# Patient Record
Sex: Male | Born: 1935 | Hispanic: Yes | Marital: Married | Smoking: Former smoker
Health system: Southern US, Community
[De-identification: ages and names within clinical notes are randomized; demographics above are authoritative.]

## PROBLEM LIST (undated history)

## (undated) DIAGNOSIS — M8580 Other specified disorders of bone density and structure, unspecified site: Secondary | ICD-10-CM

## (undated) DIAGNOSIS — I10 Essential (primary) hypertension: Secondary | ICD-10-CM

## (undated) DIAGNOSIS — Z9861 Coronary angioplasty status: Secondary | ICD-10-CM

## (undated) DIAGNOSIS — R011 Cardiac murmur, unspecified: Secondary | ICD-10-CM

## (undated) DIAGNOSIS — R0602 Shortness of breath: Secondary | ICD-10-CM

## (undated) DIAGNOSIS — K279 Peptic ulcer, site unspecified, unspecified as acute or chronic, without hemorrhage or perforation: Secondary | ICD-10-CM

## (undated) DIAGNOSIS — I209 Angina pectoris, unspecified: Secondary | ICD-10-CM

## (undated) DIAGNOSIS — I2510 Atherosclerotic heart disease of native coronary artery without angina pectoris: Secondary | ICD-10-CM

## (undated) DIAGNOSIS — E785 Hyperlipidemia, unspecified: Secondary | ICD-10-CM

## (undated) DIAGNOSIS — R413 Other amnesia: Secondary | ICD-10-CM

## (undated) DIAGNOSIS — K219 Gastro-esophageal reflux disease without esophagitis: Secondary | ICD-10-CM

## (undated) DIAGNOSIS — M1990 Unspecified osteoarthritis, unspecified site: Secondary | ICD-10-CM

## (undated) DIAGNOSIS — K5790 Diverticulosis of intestine, part unspecified, without perforation or abscess without bleeding: Secondary | ICD-10-CM

## (undated) HISTORY — PX: TONSILLECTOMY AND ADENOIDECTOMY: SUR1326

## (undated) HISTORY — DX: Gilbert syndrome: E80.4

## (undated) HISTORY — DX: Essential (primary) hypertension: I10

## (undated) HISTORY — DX: Other specified disorders of bone density and structure, unspecified site: M85.80

## (undated) HISTORY — DX: Hyperlipidemia, unspecified: E78.5

## (undated) HISTORY — PX: HEMORRHOID SURGERY: SHX153

## (undated) HISTORY — DX: Diverticulosis of intestine, part unspecified, without perforation or abscess without bleeding: K57.90

## (undated) HISTORY — DX: Peptic ulcer, site unspecified, unspecified as acute or chronic, without hemorrhage or perforation: K27.9

## (undated) HISTORY — PX: UPPER GASTROINTESTINAL ENDOSCOPY: SHX188

---

## 1995-06-17 HISTORY — PX: CERVICAL FUSION: SHX112

## 1999-10-21 ENCOUNTER — Emergency Department (HOSPITAL_COMMUNITY): Admission: EM | Admit: 1999-10-21 | Discharge: 1999-10-21 | Payer: Self-pay | Admitting: Emergency Medicine

## 2000-05-20 ENCOUNTER — Encounter: Payer: Self-pay | Admitting: Internal Medicine

## 2000-05-20 ENCOUNTER — Encounter: Admission: RE | Admit: 2000-05-20 | Discharge: 2000-05-20 | Payer: Self-pay | Admitting: Internal Medicine

## 2001-06-16 DIAGNOSIS — I2510 Atherosclerotic heart disease of native coronary artery without angina pectoris: Secondary | ICD-10-CM

## 2001-06-16 HISTORY — PX: CORONARY ARTERY BYPASS GRAFT: SHX141

## 2001-06-16 HISTORY — DX: Atherosclerotic heart disease of native coronary artery without angina pectoris: I25.10

## 2001-07-22 ENCOUNTER — Inpatient Hospital Stay (HOSPITAL_COMMUNITY): Admission: AD | Admit: 2001-07-22 | Discharge: 2001-07-27 | Payer: Self-pay | Admitting: *Deleted

## 2001-07-22 ENCOUNTER — Encounter: Payer: Self-pay | Admitting: Thoracic Surgery (Cardiothoracic Vascular Surgery)

## 2001-07-23 ENCOUNTER — Encounter: Payer: Self-pay | Admitting: Thoracic Surgery (Cardiothoracic Vascular Surgery)

## 2001-07-24 ENCOUNTER — Encounter: Payer: Self-pay | Admitting: Thoracic Surgery (Cardiothoracic Vascular Surgery)

## 2001-07-25 ENCOUNTER — Encounter: Payer: Self-pay | Admitting: Thoracic Surgery (Cardiothoracic Vascular Surgery)

## 2001-08-17 ENCOUNTER — Encounter (HOSPITAL_COMMUNITY): Admission: RE | Admit: 2001-08-17 | Discharge: 2001-11-15 | Payer: Self-pay | Admitting: Internal Medicine

## 2001-11-11 ENCOUNTER — Encounter: Payer: Self-pay | Admitting: Internal Medicine

## 2001-11-11 ENCOUNTER — Encounter: Admission: RE | Admit: 2001-11-11 | Discharge: 2001-11-11 | Payer: Self-pay | Admitting: Internal Medicine

## 2001-11-16 ENCOUNTER — Encounter (HOSPITAL_COMMUNITY): Admission: RE | Admit: 2001-11-16 | Discharge: 2002-02-14 | Payer: Self-pay | Admitting: Cardiology

## 2002-06-16 HISTORY — PX: CHOLECYSTECTOMY: SHX55

## 2003-02-10 ENCOUNTER — Encounter: Admission: RE | Admit: 2003-02-10 | Discharge: 2003-02-10 | Payer: Self-pay | Admitting: Internal Medicine

## 2003-02-10 ENCOUNTER — Encounter: Payer: Self-pay | Admitting: Internal Medicine

## 2003-02-13 ENCOUNTER — Encounter: Payer: Self-pay | Admitting: Internal Medicine

## 2003-02-13 ENCOUNTER — Inpatient Hospital Stay (HOSPITAL_COMMUNITY): Admission: EM | Admit: 2003-02-13 | Discharge: 2003-02-15 | Payer: Self-pay | Admitting: Internal Medicine

## 2003-02-14 ENCOUNTER — Encounter: Payer: Self-pay | Admitting: Internal Medicine

## 2003-02-14 ENCOUNTER — Encounter (INDEPENDENT_AMBULATORY_CARE_PROVIDER_SITE_OTHER): Payer: Self-pay | Admitting: Specialist

## 2004-08-29 ENCOUNTER — Ambulatory Visit: Payer: Self-pay | Admitting: Internal Medicine

## 2005-03-18 ENCOUNTER — Ambulatory Visit: Payer: Self-pay | Admitting: Internal Medicine

## 2005-05-13 ENCOUNTER — Ambulatory Visit: Payer: Self-pay | Admitting: Internal Medicine

## 2005-05-21 ENCOUNTER — Ambulatory Visit: Payer: Self-pay | Admitting: Internal Medicine

## 2005-06-05 ENCOUNTER — Ambulatory Visit: Payer: Self-pay | Admitting: Internal Medicine

## 2005-06-11 ENCOUNTER — Ambulatory Visit: Payer: Self-pay | Admitting: Internal Medicine

## 2005-08-19 ENCOUNTER — Ambulatory Visit: Payer: Self-pay | Admitting: Internal Medicine

## 2005-09-02 ENCOUNTER — Ambulatory Visit: Payer: Self-pay | Admitting: Internal Medicine

## 2005-11-07 ENCOUNTER — Ambulatory Visit: Payer: Self-pay | Admitting: Internal Medicine

## 2005-11-18 ENCOUNTER — Ambulatory Visit: Payer: Self-pay | Admitting: Internal Medicine

## 2006-03-26 ENCOUNTER — Ambulatory Visit: Payer: Self-pay | Admitting: Internal Medicine

## 2006-04-02 ENCOUNTER — Ambulatory Visit: Payer: Self-pay | Admitting: Internal Medicine

## 2006-04-23 ENCOUNTER — Ambulatory Visit: Payer: Self-pay | Admitting: Gastroenterology

## 2006-05-04 ENCOUNTER — Ambulatory Visit: Payer: Self-pay | Admitting: Gastroenterology

## 2006-06-04 ENCOUNTER — Ambulatory Visit: Payer: Self-pay | Admitting: Internal Medicine

## 2006-06-10 ENCOUNTER — Encounter: Admission: RE | Admit: 2006-06-10 | Discharge: 2006-06-10 | Payer: Self-pay | Admitting: Internal Medicine

## 2006-06-16 HISTORY — PX: CORONARY STENT PLACEMENT: SHX1402

## 2006-09-10 DIAGNOSIS — I2510 Atherosclerotic heart disease of native coronary artery without angina pectoris: Secondary | ICD-10-CM | POA: Insufficient documentation

## 2006-10-26 ENCOUNTER — Ambulatory Visit: Payer: Self-pay | Admitting: Internal Medicine

## 2006-10-26 LAB — CONVERTED CEMR LAB
ALT: 23 units/L (ref 0–40)
AST: 20 units/L (ref 0–37)
Basophils Absolute: 0.1 10*3/uL (ref 0.0–0.1)
Basophils Relative: 0.8 % (ref 0.0–1.0)
Bilirubin, Direct: 0.1 mg/dL (ref 0.0–0.3)
CO2: 33 meq/L — ABNORMAL HIGH (ref 19–32)
Calcium: 9.4 mg/dL (ref 8.4–10.5)
Cholesterol: 155 mg/dL (ref 0–200)
Eosinophils Relative: 9.6 % — ABNORMAL HIGH (ref 0.0–5.0)
GFR calc Af Amer: 95 mL/min
Glucose, Bld: 99 mg/dL (ref 70–99)
HCT: 46.5 % (ref 39.0–52.0)
HDL: 42 mg/dL (ref >39.0)
Hemoglobin: 16.4 g/dL (ref 13.0–17.0)
Lymphocytes Relative: 23.5 % (ref 12.0–46.0)
MCHC: 35.3 g/dL (ref 30.0–36.0)
MCV: 88.6 fL (ref 78.0–100.0)
Monocytes Absolute: 0.6 10*3/uL (ref 0.2–0.7)
Neutrophils Relative %: 57.2 % (ref 43.0–77.0)
TSH: 1.44 microintl units/mL (ref 0.35–5.50)
Triglycerides: 149 mg/dL (ref 0–149)
VLDL: 30 mg/dL (ref 0–40)
WBC: 7.2 10*3/uL (ref 4.5–10.5)

## 2006-10-30 ENCOUNTER — Encounter: Payer: Self-pay | Admitting: Internal Medicine

## 2006-10-30 ENCOUNTER — Ambulatory Visit: Payer: Self-pay | Admitting: Internal Medicine

## 2006-12-31 ENCOUNTER — Ambulatory Visit: Payer: Self-pay | Admitting: Internal Medicine

## 2007-02-06 ENCOUNTER — Ambulatory Visit: Payer: Self-pay | Admitting: Cardiology

## 2007-02-06 ENCOUNTER — Inpatient Hospital Stay (HOSPITAL_COMMUNITY): Admission: EM | Admit: 2007-02-06 | Discharge: 2007-02-09 | Payer: Self-pay | Admitting: Emergency Medicine

## 2007-02-08 ENCOUNTER — Other Ambulatory Visit: Payer: Self-pay | Admitting: Cardiology

## 2007-02-09 ENCOUNTER — Other Ambulatory Visit: Payer: Self-pay | Admitting: Cardiology

## 2007-02-11 ENCOUNTER — Ambulatory Visit: Payer: Self-pay | Admitting: Internal Medicine

## 2007-02-11 DIAGNOSIS — R7989 Other specified abnormal findings of blood chemistry: Secondary | ICD-10-CM | POA: Insufficient documentation

## 2007-02-13 LAB — CONVERTED CEMR LAB: Hgb A1c MFr Bld: 5.5 % (ref 4.6–6.0)

## 2007-02-16 ENCOUNTER — Encounter (INDEPENDENT_AMBULATORY_CARE_PROVIDER_SITE_OTHER): Payer: Self-pay | Admitting: *Deleted

## 2007-02-25 ENCOUNTER — Ambulatory Visit: Payer: Self-pay | Admitting: Cardiology

## 2007-04-27 ENCOUNTER — Ambulatory Visit: Payer: Self-pay | Admitting: Internal Medicine

## 2007-04-27 LAB — CONVERTED CEMR LAB
Cholesterol: 134 mg/dL (ref 0–200)
LDL Cholesterol: 66 mg/dL (ref 0–99)
Triglycerides: 143 mg/dL (ref 0–149)
VLDL: 29 mg/dL (ref 0–40)

## 2007-05-04 ENCOUNTER — Ambulatory Visit: Payer: Self-pay | Admitting: Internal Medicine

## 2007-05-04 DIAGNOSIS — I10 Essential (primary) hypertension: Secondary | ICD-10-CM | POA: Insufficient documentation

## 2007-05-04 DIAGNOSIS — E782 Mixed hyperlipidemia: Secondary | ICD-10-CM | POA: Insufficient documentation

## 2007-05-04 LAB — CONVERTED CEMR LAB
HDL goal, serum: 40 mg/dL
LDL Goal: 100 mg/dL

## 2007-06-02 ENCOUNTER — Ambulatory Visit: Payer: Self-pay | Admitting: Internal Medicine

## 2007-06-03 ENCOUNTER — Ambulatory Visit: Payer: Self-pay | Admitting: Cardiology

## 2007-06-08 ENCOUNTER — Encounter (INDEPENDENT_AMBULATORY_CARE_PROVIDER_SITE_OTHER): Payer: Self-pay | Admitting: *Deleted

## 2007-11-15 ENCOUNTER — Telehealth (INDEPENDENT_AMBULATORY_CARE_PROVIDER_SITE_OTHER): Payer: Self-pay | Admitting: *Deleted

## 2008-02-15 ENCOUNTER — Ambulatory Visit: Payer: Self-pay | Admitting: Cardiology

## 2008-02-18 ENCOUNTER — Ambulatory Visit: Payer: Self-pay

## 2008-02-18 ENCOUNTER — Ambulatory Visit: Payer: Self-pay | Admitting: Cardiology

## 2008-02-18 ENCOUNTER — Encounter: Payer: Self-pay | Admitting: Internal Medicine

## 2008-02-18 LAB — CONVERTED CEMR LAB
Albumin: 4.4 g/dL (ref 3.5–5.2)
Alkaline Phosphatase: 59 units/L (ref 39–117)
Cholesterol: 117 mg/dL (ref 0–200)
HDL: 39.4 mg/dL (ref >39.0)
LDL Cholesterol: 61 mg/dL (ref 0–99)
Total Protein: 7.5 g/dL (ref 6.0–8.3)
Triglycerides: 85 mg/dL (ref 0–149)
VLDL: 17 mg/dL (ref 0–40)

## 2008-03-06 ENCOUNTER — Telehealth (INDEPENDENT_AMBULATORY_CARE_PROVIDER_SITE_OTHER): Payer: Self-pay | Admitting: *Deleted

## 2008-03-06 ENCOUNTER — Telehealth: Payer: Self-pay | Admitting: Internal Medicine

## 2008-03-06 ENCOUNTER — Encounter: Payer: Self-pay | Admitting: Internal Medicine

## 2008-03-29 ENCOUNTER — Ambulatory Visit: Payer: Self-pay | Admitting: Internal Medicine

## 2008-06-01 ENCOUNTER — Ambulatory Visit: Payer: Self-pay | Admitting: Cardiology

## 2008-06-16 DIAGNOSIS — M8580 Other specified disorders of bone density and structure, unspecified site: Secondary | ICD-10-CM

## 2008-06-16 HISTORY — DX: Other specified disorders of bone density and structure, unspecified site: M85.80

## 2008-08-17 ENCOUNTER — Ambulatory Visit: Payer: Self-pay | Admitting: Internal Medicine

## 2008-10-12 ENCOUNTER — Telehealth: Payer: Self-pay | Admitting: Cardiology

## 2008-10-17 ENCOUNTER — Telehealth (INDEPENDENT_AMBULATORY_CARE_PROVIDER_SITE_OTHER): Payer: Self-pay | Admitting: *Deleted

## 2008-10-30 ENCOUNTER — Telehealth: Payer: Self-pay | Admitting: Cardiology

## 2008-11-08 ENCOUNTER — Ambulatory Visit: Payer: Self-pay | Admitting: Internal Medicine

## 2008-11-12 LAB — CONVERTED CEMR LAB
AST: 21 units/L (ref 0–37)
Albumin: 4.2 g/dL (ref 3.5–5.2)
Alkaline Phosphatase: 57 units/L (ref 39–117)
Basophils Absolute: 0 10*3/uL (ref 0.0–0.1)
GFR calc non Af Amer: 88.29 mL/min (ref >60)
Glucose, Bld: 95 mg/dL (ref 70–99)
Hemoglobin: 15.7 g/dL (ref 13.0–17.0)
LDL Cholesterol: 70 mg/dL (ref 0–99)
Lymphocytes Relative: 22.3 % (ref 12.0–46.0)
Monocytes Relative: 8.7 % (ref 3.0–12.0)
Neutro Abs: 3.7 10*3/uL (ref 1.4–7.7)
Potassium: 4.1 meq/L (ref 3.5–5.1)
RBC: 4.96 M/uL (ref 4.22–5.81)
RDW: 12.1 % (ref 11.5–14.6)
Sodium: 142 meq/L (ref 135–145)
TSH: 1.75 microintl units/mL (ref 0.35–5.50)
Total CHOL/HDL Ratio: 3
VLDL: 22.8 mg/dL (ref 0.0–40.0)

## 2008-11-16 ENCOUNTER — Ambulatory Visit: Payer: Self-pay | Admitting: Internal Medicine

## 2008-11-16 DIAGNOSIS — Z9861 Coronary angioplasty status: Secondary | ICD-10-CM

## 2008-11-16 DIAGNOSIS — K5730 Diverticulosis of large intestine without perforation or abscess without bleeding: Secondary | ICD-10-CM | POA: Insufficient documentation

## 2008-11-16 DIAGNOSIS — R29890 Loss of height: Secondary | ICD-10-CM | POA: Insufficient documentation

## 2008-11-16 DIAGNOSIS — I2510 Atherosclerotic heart disease of native coronary artery without angina pectoris: Secondary | ICD-10-CM | POA: Insufficient documentation

## 2008-11-23 ENCOUNTER — Encounter: Payer: Self-pay | Admitting: Internal Medicine

## 2008-11-23 ENCOUNTER — Ambulatory Visit: Payer: Self-pay | Admitting: Family Medicine

## 2008-12-20 ENCOUNTER — Ambulatory Visit: Payer: Self-pay | Admitting: Internal Medicine

## 2008-12-20 ENCOUNTER — Encounter (INDEPENDENT_AMBULATORY_CARE_PROVIDER_SITE_OTHER): Payer: Self-pay | Admitting: *Deleted

## 2008-12-23 LAB — CONVERTED CEMR LAB: Vit D, 25-Hydroxy: 37 ng/mL (ref 30–89)

## 2008-12-27 ENCOUNTER — Encounter (INDEPENDENT_AMBULATORY_CARE_PROVIDER_SITE_OTHER): Payer: Self-pay | Admitting: *Deleted

## 2009-01-09 ENCOUNTER — Ambulatory Visit: Payer: Self-pay | Admitting: Internal Medicine

## 2009-01-09 ENCOUNTER — Telehealth (INDEPENDENT_AMBULATORY_CARE_PROVIDER_SITE_OTHER): Payer: Self-pay | Admitting: *Deleted

## 2009-01-09 DIAGNOSIS — M1990 Unspecified osteoarthritis, unspecified site: Secondary | ICD-10-CM | POA: Insufficient documentation

## 2009-01-09 DIAGNOSIS — M8580 Other specified disorders of bone density and structure, unspecified site: Secondary | ICD-10-CM | POA: Insufficient documentation

## 2009-01-16 ENCOUNTER — Encounter (INDEPENDENT_AMBULATORY_CARE_PROVIDER_SITE_OTHER): Payer: Self-pay | Admitting: *Deleted

## 2009-01-16 LAB — CONVERTED CEMR LAB
Rhuematoid fact SerPl-aCnc: 20 intl units/mL (ref 0.0–20.0)
Uric Acid, Serum: 5.9 mg/dL (ref 4.0–7.8)

## 2009-01-24 ENCOUNTER — Telehealth: Payer: Self-pay | Admitting: Cardiology

## 2009-01-26 ENCOUNTER — Telehealth: Payer: Self-pay | Admitting: Cardiology

## 2009-02-01 ENCOUNTER — Telehealth: Payer: Self-pay | Admitting: Internal Medicine

## 2009-03-02 ENCOUNTER — Encounter (INDEPENDENT_AMBULATORY_CARE_PROVIDER_SITE_OTHER): Payer: Self-pay | Admitting: *Deleted

## 2009-03-12 ENCOUNTER — Ambulatory Visit: Payer: Self-pay | Admitting: Internal Medicine

## 2009-03-13 ENCOUNTER — Ambulatory Visit: Payer: Self-pay | Admitting: Cardiology

## 2009-03-13 ENCOUNTER — Telehealth: Payer: Self-pay | Admitting: Internal Medicine

## 2009-03-13 ENCOUNTER — Encounter: Payer: Self-pay | Admitting: Internal Medicine

## 2009-03-14 ENCOUNTER — Encounter: Payer: Self-pay | Admitting: Internal Medicine

## 2009-03-14 ENCOUNTER — Telehealth (INDEPENDENT_AMBULATORY_CARE_PROVIDER_SITE_OTHER): Payer: Self-pay | Admitting: *Deleted

## 2009-03-14 ENCOUNTER — Encounter (INDEPENDENT_AMBULATORY_CARE_PROVIDER_SITE_OTHER): Payer: Self-pay | Admitting: *Deleted

## 2009-03-14 DIAGNOSIS — IMO0002 Reserved for concepts with insufficient information to code with codable children: Secondary | ICD-10-CM | POA: Insufficient documentation

## 2009-03-15 ENCOUNTER — Encounter: Payer: Self-pay | Admitting: Internal Medicine

## 2009-03-21 ENCOUNTER — Encounter (INDEPENDENT_AMBULATORY_CARE_PROVIDER_SITE_OTHER): Payer: Self-pay | Admitting: *Deleted

## 2009-04-17 ENCOUNTER — Telehealth (INDEPENDENT_AMBULATORY_CARE_PROVIDER_SITE_OTHER): Payer: Self-pay | Admitting: *Deleted

## 2009-04-25 ENCOUNTER — Ambulatory Visit: Payer: Self-pay | Admitting: Internal Medicine

## 2009-04-30 ENCOUNTER — Encounter: Payer: Self-pay | Admitting: Internal Medicine

## 2009-05-23 ENCOUNTER — Emergency Department (HOSPITAL_BASED_OUTPATIENT_CLINIC_OR_DEPARTMENT_OTHER): Admission: EM | Admit: 2009-05-23 | Discharge: 2009-05-23 | Payer: Self-pay | Admitting: Emergency Medicine

## 2009-06-04 ENCOUNTER — Ambulatory Visit: Payer: Self-pay | Admitting: Cardiology

## 2009-09-21 ENCOUNTER — Ambulatory Visit: Payer: Self-pay | Admitting: Internal Medicine

## 2009-09-21 DIAGNOSIS — M546 Pain in thoracic spine: Secondary | ICD-10-CM | POA: Insufficient documentation

## 2009-09-21 DIAGNOSIS — K219 Gastro-esophageal reflux disease without esophagitis: Secondary | ICD-10-CM | POA: Insufficient documentation

## 2009-09-21 LAB — CONVERTED CEMR LAB
Nitrite: NEGATIVE
Urobilinogen, UA: NEGATIVE
Vit D, 25-Hydroxy: 47 ng/mL (ref 30–89)

## 2009-09-24 LAB — CONVERTED CEMR LAB
ALT: 19 units/L (ref 0–53)
AST: 21 units/L (ref 0–37)
Basophils Relative: 0.6 % (ref 0.0–3.0)
Bilirubin, Direct: 0.2 mg/dL (ref 0.0–0.3)
Chloride: 102 meq/L (ref 96–112)
Creatinine, Ser: 0.8 mg/dL (ref 0.4–1.5)
Eosinophils Relative: 19.6 % — ABNORMAL HIGH (ref 0.0–5.0)
GFR calc non Af Amer: 100.89 mL/min (ref >60)
HCT: 44.2 % (ref 39.0–52.0)
LDL Cholesterol: 70 mg/dL (ref 0–99)
MCV: 91.3 fL (ref 78.0–100.0)
Monocytes Absolute: 0.5 10*3/uL (ref 0.1–1.0)
Monocytes Relative: 6.2 % (ref 3.0–12.0)
Neutrophils Relative %: 56.3 % (ref 43.0–77.0)
Potassium: 3.9 meq/L (ref 3.5–5.1)
RBC: 4.85 M/uL (ref 4.22–5.81)
Total CHOL/HDL Ratio: 3
Total Protein: 7.8 g/dL (ref 6.0–8.3)
VLDL: 19 mg/dL (ref 0.0–40.0)
WBC: 7.4 10*3/uL (ref 4.5–10.5)

## 2009-11-16 ENCOUNTER — Encounter: Payer: Self-pay | Admitting: Internal Medicine

## 2009-11-16 ENCOUNTER — Telehealth: Payer: Self-pay | Admitting: Internal Medicine

## 2009-12-31 ENCOUNTER — Telehealth (INDEPENDENT_AMBULATORY_CARE_PROVIDER_SITE_OTHER): Payer: Self-pay | Admitting: *Deleted

## 2010-04-11 ENCOUNTER — Ambulatory Visit: Payer: Self-pay

## 2010-04-11 ENCOUNTER — Ambulatory Visit: Payer: Self-pay | Admitting: Internal Medicine

## 2010-04-11 DIAGNOSIS — M25469 Effusion, unspecified knee: Secondary | ICD-10-CM | POA: Insufficient documentation

## 2010-04-11 DIAGNOSIS — M79609 Pain in unspecified limb: Secondary | ICD-10-CM | POA: Insufficient documentation

## 2010-04-11 DIAGNOSIS — M2550 Pain in unspecified joint: Secondary | ICD-10-CM | POA: Insufficient documentation

## 2010-04-12 ENCOUNTER — Encounter: Payer: Self-pay | Admitting: Internal Medicine

## 2010-04-23 ENCOUNTER — Ambulatory Visit: Payer: Self-pay | Admitting: Cardiology

## 2010-05-15 ENCOUNTER — Telehealth: Payer: Self-pay | Admitting: Cardiology

## 2010-06-26 ENCOUNTER — Encounter
Admission: RE | Admit: 2010-06-26 | Discharge: 2010-06-26 | Payer: Self-pay | Source: Home / Self Care | Attending: Specialist | Admitting: Specialist

## 2010-07-14 LAB — CONVERTED CEMR LAB
Rhuematoid fact SerPl-aCnc: <20 intl units/mL (ref 0–20)
Sed Rate: 13 mm/hr (ref 0–22)

## 2010-07-16 NOTE — Letter (Signed)
----   Converted from flag ---- ---- 04/11/2010 3:59 PM, Missy Al-Rammal, RVT, RDCS wrote: No DVT, no Baker's cyst, bilaterally. Moderate deep venous incompetence in the right calf. ------------------------------

## 2010-07-16 NOTE — Progress Notes (Signed)
Summary: Refill  Phone Note Refill Request Call back at [phone] Message from:  Patient on December 31, 2009 12:16 PM  Refills Requested: Medication #1:  ALENDRONATE SODIUM 70 MG TABS 1 weekly with @ least 8 oz of water only; upright for > 1 hr [facility]   Method Requested: Telephone to Pharmacy Initial call taken by: Freddy Jaksch,  December 31, 2009 12:17 PM    Prescriptions: ALENDRONATE SODIUM 70 MG TABS (ALENDRONATE SODIUM) 1 weekly with @ least 8 oz of water only; upright for > 1 hr  #4 x 11   Entered by:   Shonna Chock CMA   Authorized by:   Marga Melnick MD   Signed by:   Shonna Chock CMA on 12/31/2009   Method used:   Electronically to        [facility] (retail)       [address]       [place], [place]  [address]       Ph: [phone]       Fax: [phone]   RxID:   [id number]

## 2010-07-16 NOTE — Assessment & Plan Note (Signed)
Summary: emp//pt will be fasting//lch   Vital Signs:  Patient profile:   76 year old male Height:      64 inches Weight:      161.8 pounds Temp:     97.7 degrees F oral Pulse rate:   72 / minute Resp:     16 per minute BP sitting:   104 / 70  (left arm) Cuff size:   large  Vitals Entered By: Shonna Chock (September 21, 2009 11:21 AM) CC: 1.) Yearly follow-up and fasting labs 2.) Pain and itching in the middle of the back x 1 month  3.) Ongoing gas-ongoing concerned-discussed with cardiologist also Comments REVIEWED MED LIST, PATIENT AGREED DOSE AND INSTRUCTION CORRECT    Primary Care Provider:  Marga Melnick MD  CC:  1.) Yearly follow-up and fasting labs 2.) Pain and itching in the middle of the back x 1 month  3.) Ongoing gas-ongoing concerned-discussed with cardiologist also.  History of Present Illness: [name] is here for a physical; he is practicing preventive health care.He has 2 issues as delineated above.Cardiology 06/04/2009  evaluation was  reviewed.  Allergies: 1)  Codeine 2)  * Codiene  Past History:  Past Medical History: Hyperlipidemia Hypertension Coronary artery disease Gilbert's Syndrome Diverticulosis, colon Osteopenia: T score -1.9 @ hip(femoral neck), 2010  Past Surgical History: CABG (2003.  The last catheterization was in August 2008 demonstrated a LIMA to the LAD which was patent, there was an atretic saphenous vein graft to the diagonal, the LAD had a 90% stenosis in the large calcified segment, the diagonal had ostial 70% stenosis, the circumflex had a ramus intermediate with ostial 25% stenosis, the right coronary artery was dominant.  There was an ulcerated 80-90% stenosis.  The EF was 65%. He had a drug-eluting stent placed to the right coronary artery), Colonoscopy :Diverticulosis 2007, Dr Jarold Motto Cervical fusion C5-6, Dr Jeral Fruit Cholecystectomy Hemorrhoidectomy  Family History: Father: cancer , ? primary Mother:Osteoporosis  Siblings:  sister: Osteoporosis; daughter : uterine cancer No FH MI,CVA, DM  Social History: Retired Married Former Smoker: quit 1980 Alcohol use-yes: 1 glass wine daily Regular exercise-yes: 1 X/week as walking 30 min  Review of Systems General:  Denies chills, fever, and sweats; Weight loss of 2#. Eyes:  Denies blurring, double vision, and vision loss-both eyes. ENT:  Denies difficulty swallowing and hoarseness. CV:  Complains of chest pain or discomfort; denies difficulty breathing at night, difficulty breathing while lying down, leg cramps with exertion, palpitations, and shortness of breath with exertion; see Dr Jenene Slicker 06/04/2009; chest apin with gas. Resp:  Denies cough, shortness of breath, and sputum productive. GI:  Complains of gas and indigestion; denies abdominal pain, bloody stools, constipation, dark tarry stools, diarrhea, loss of appetite, nausea, and vomiting; He has Ranitidine ? 150 mg @ home but is not taking it.. GU:  Denies discharge, dysuria, and hematuria. MS:  Complains of low back pain and mid back pain; denies joint redness and joint swelling. Derm:  Complains of dryness and itching; denies changes in color of skin, lesion(s), and rash. Neuro:  Complains of numbness and tingling; denies brief paralysis and weakness; N&T positionally C8 LUE distribution. Psych:  Denies depression; Intermittent anxiety. Endo:  Denies excessive hunger, excessive thirst, and excessive urination. Heme:  Complains of abnormal bruising; denies bleeding; On Plavix. Allergy:  Denies hives or rash and itching eyes.  Physical Exam  General:  well-nourished, alert,appropriate and cooperative throughout examination; appears younger than age Head:  Normocephalic and atraumatic without  obvious abnormalities.  Eyes:  No corneal or conjunctival inflammation noted.Perrla. Funduscopic exam benign, without hemorrhages, exudates or papilledema. Ears:  External ear exam shows no significant lesions or  deformities.  Otoscopic examination reveals clear canals, tympanic membranes are intact bilaterally without bulging, retraction, inflammation or discharge. Hearing is grossly normal bilaterally. Nose:  External nasal examination shows no deformity or inflammation. Nasal mucosa are pink and moist without lesions or exudates. Mouth:  Oral mucosa and oropharynx without lesions or exudates.  Upper plate ; lower partial Neck:  No deformities, masses, or tenderness noted.R lobe > L  Chest Wall:  Prominent xiphoid Lungs:  Normal respiratory effort, chest expands symmetrically. Lungs are clear to auscultation, no crackles or wheezes. Heart:  normal rate, regular rhythm, no gallop, no rub, no JVD, no HJR, and grade 1 /6 systolic murmur.   Abdomen:  Bowel sounds positive,abdomen soft and non-tender without masses, organomegaly or hernias noted. Rectal:  No external abnormalities noted. Normal sphincter tone. No rectal masses or tenderness. Genitalia:  Testes bilaterally descended without nodularity, tenderness or masses. No scrotal masses or lesions. No penis lesions or urethral discharge. L hydrocele.   Prostate:  Prostate gland firm and smooth, no enlargement, nodularity, tenderness, mass, asymmetry or induration. Msk:  Straightening of spine Pulses:  R and L carotid,radial,dorsalis pedis and posterior tibial pulses are full and equal bilaterally Extremities:  No clubbing, cyanosis, edema, or deformity noted with normal full range of motion of all joints.  Neg SLR Neurologic:  alert & oriented X3, strength normal in all extremities, and DTRs symmetrical and normal.   Skin:  Intact without suspicious lesions or rashes Cervical Nodes:  No lymphadenopathy noted Axillary Nodes:  No palpable lymphadenopathy Psych:  memory intact for recent and remote, normally interactive, and good eye contact.      Impression & Recommendations:  Problem # 1:  PREVENTIVE HEALTH CARE (ICD-V70.0)  Orders: Venipuncture  (16109) TLB-Lipid Panel (80061-LIPID) TLB-BMP (Basic Metabolic Panel-BMET) (80048-METABOL) TLB-CBC Platelet - w/Differential (85025-CBCD) TLB-Hepatic/Liver Function Pnl (80076-HEPATIC) TLB-TSH (Thyroid Stimulating Hormone) (84443-TSH) T-Vitamin D (25-Hydroxy) (60454-09811) UA Dipstick w/o Micro (manual) (91478)  Problem # 2:  GERD (ICD-530.81) presenting as "gas"  Problem # 3:  BACK PAIN, THORACIC REGION (ICD-724.1) DDD His updated medication list for this problem includes:    Adult Aspirin Low Strength 81 Mg Tbdp (Aspirin) .[name]... 1 by mouth once daily  Problem # 4:  CORONARY ARTERY DISEASE (ICD-414.00)  as per Dr Antoine Poche His updated medication list for this problem includes:    Plavix 75 Mg Tabs (Clopidogrel bisulfate) .[name]... 1 by mouth qd    Adult Aspirin Low Strength 81 Mg Tbdp (Aspirin) .[name]... 1 by mouth once daily    Metoprolol Tartrate 25 Mg Tabs (Metoprolol tartrate) .[name]... Take 1/2 tablet by mouth twice a day    Nitroglycerin 0.4 Mg Subl (Nitroglycerin) .[name]... Place 1 tablet under tongue as directed  Orders: Venipuncture (29562)  Problem # 5:  HYPERTENSION, ESSENTIAL NOS (ICD-401.9)  His updated medication list for this problem includes:    Metoprolol Tartrate 25 Mg Tabs (Metoprolol tartrate) .[name]... Take 1/2 tablet by mouth twice a day  Orders: Venipuncture (13086)  Problem # 6:  HYPERLIPIDEMIA (ICD-272.2)  His updated medication list for this problem includes:    Lipitor 40 Mg Tabs (Atorvastatin calcium) .[name]... 1/2 by mouth daily  Orders: Venipuncture (57846) TLB-Lipid Panel (80061-LIPID)  Problem # 7:  OSTEOPENIA (ICD-733.90)  His updated medication list for this problem includes:    Alendronate Sodium 70 Mg  Tabs (Alendronate sodium) .[name]... 1 weekly with @ least 8 oz of water only; upright for > 1 hr  Orders: Venipuncture (19147) T-Vitamin D (25-Hydroxy) (82956-21308)  Complete Medication List: 1)  Lipitor 40 Mg Tabs (Atorvastatin calcium) .... 1/2 by mouth  daily 2)  Plavix 75 Mg Tabs (Clopidogrel bisulfate) .[name].. 1 by mouth qd 3)  Adult Aspirin Low Strength 81 Mg Tbdp (Aspirin) .[name].. 1 by mouth once daily 4)  Metoprolol Tartrate 25 Mg Tabs (Metoprolol tartrate) .... Take 1/2 tablet by mouth twice a day 5)  Nitroglycerin 0.4 Mg Subl (Nitroglycerin) .... Place 1 tablet under tongue as directed 6)  Multivitamins Tabs (Multiple vitamin) .[name].. 1 by mouth once daily 7)  Oscal (vit D3 600/calcium 500mg )  .[name].. 1 by mouth once daily 8)  Vitamin D3 2000 Unit Caps (Cholecalciferol) .... 2 by mouth once daily 9)  Alendronate Sodium 70 Mg Tabs (Alendronate sodium) .[name] KitchenMarland KitchenMarland Kitchen 1 weekly with @ least 8 oz of water only; upright for > 1 hr 10)  Coq10 200 Mg Caps (Coenzyme q10) .[name].. 1 by mouth once daily 11)  Klonopin 0.5 Mg Tabs (Clonazepam) .... As needed 12)  Reglan 10 Mg Tabs (Metoclopramide hcl) .... As needed 13)  Phazyme 180 Mg Caps (Simethicone) .... As needed 14)  Beano Tabs (Alpha-d-galactosidase) .... As needed  Patient Instructions: 1)  Zantac 150 (Ranitidine) 30 min before b'fast & eve meal X 8 weeks for "gas". 2)  Avoid foods high in acid (tomatoes, citrus juices, spicy foods). Avoid eating within two hours of lying down or before exercising. Do not over eat; try smaller more frequent meals. Elevate head of bed twelve inches when sleeping.  Laboratory Results   Urine Tests   Date/Time Reported: September 21, 2009 1:03 PM   Routine Urinalysis   Color: yellow Appearance: Clear Glucose: negative   (Normal Range: Negative) Bilirubin: negative   (Normal Range: Negative) Ketone: negative   (Normal Range: Negative) Spec. Gravity: 1.015   (Normal Range: 1.003-1.035) Blood: negative   (Normal Range: Negative) pH: 5.0   (Normal Range: 5.0-8.0) Protein: negative   (Normal Range: Negative) Urobilinogen: negative   (Normal Range: 0-1) Nitrite: negative   (Normal Range: Negative) Leukocyte Esterace: negative   (Normal Range: Negative)    Comments: Floydene Flock  September 21, 2009 1:03 PM

## 2010-07-16 NOTE — Progress Notes (Signed)
Summary:  OK to use Ginkbo Biloba,ginseng, honey?  OK pt aware  Phone Note Call from Patient Call back at Home Phone [phone] Call back at [phone]   Caller: Patient Reason for Call: Talk to Nurse Complaint: Urinary/GYN Problems Summary of Call: pt has question on medication Initial call taken by: Roe Coombs,  May 15, 2010 9:15 AM  Follow-up for Phone Call        Pt wants to start a dietary supplement of Ginkgo Biloba, Ginseng and honey.  Pt is aware I will discuss with our pharmacist and Dr Antoine Poche and call him back. Follow-up by: Charolotte Capuchin, RN,  May 15, 2010 9:47 AM  Additional Follow-up for Phone Call Additional follow up Details #1::        OK to use. Additional Follow-up by: Rollene Rotunda, MD, [facility],  May 15, 2010 8:31 PM    Additional Follow-up for Phone Call Additional follow up Details #2::    pt aware ok to use per Dr Antoine Poche Follow-up by: Charolotte Capuchin, RN,  May 16, 2010 8:17 AM

## 2010-07-16 NOTE — Assessment & Plan Note (Signed)
Summary: FOLLOWUP, WILL BE FASTING--[facility] 11/8///[facility]   Vital Signs:  Patient profile:   76 year old male Weight:      163.3 pounds BMI:     28.13 Temp:     97.9 degrees F oral Pulse rate:   72 / minute Resp:     16 per minute BP sitting:   110 / 68  (left arm) Cuff size:   large  Vitals Entered By: Shonna Chock CMA (April 11, 2010 10:56 AM) CC: C/O pain in legs and fasting if any labs needed, Lower Extremity Joint pain   Primary Care Provider:  Marga Melnick MD  CC:  C/O pain in legs and fasting if any labs needed and Lower Extremity Joint pain.  History of Present Illness: Lower Extremity  Pains      This is a 76 year old man who presents with Lower Extremity Pains for 10 days  The patient reports swelling  & popping of L knee, but denies redness, giving away, locking, and stiffness for >1 hr.  There is also  pain  located in the left hip, L thigh,  right knee, left medial lower leg  and right  lower lateral leg(not ankles)  The pain began suddenly and after driving 5 hrs  The pain is described as  constant & sharp.  No evaluation to date .  The patient denies the following symptoms: fever, rash, photosensitivity, eye symptoms, diarrhea, and dysuria ( urinary frequency).  Rx: vibrating massage devive & NSAIDS X 1 dose with benefit. PMH of acute damage to R gastrocnemius while running 8-9 yrs ago. No PMH of DVT or PTE.He questioned relationship of muscle symptoms to statin.  Current Medications (verified): 1)  Lipitor 40 Mg  Tabs (Atorvastatin Calcium) .... 1/2 By Mouth Daily 2)  Plavix 75 Mg  Tabs (Clopidogrel Bisulfate) .[name].. 1 By Mouth Qd 3)  Adult Aspirin Low Strength 81 Mg Tbdp (Aspirin) .[name].. 1 By Mouth Once Daily 4)  Metoprolol Tartrate 25 Mg Tabs (Metoprolol Tartrate) .... Take 1/2 Tablet By Mouth Twice A Day 5)  Nitroglycerin 0.4 Mg Subl (Nitroglycerin) .... Place 1 Tablet Under Tongue As Directed 6)  Multivitamins  Tabs (Multiple Vitamin) .[name].. 1 By Mouth Once  Daily 7)  Oscal (Vit D3 600/calcium 500mg ) .[name].. 1 By Mouth Once Daily 8)  Vitamin D3 2000 Unit Caps (Cholecalciferol) .... 2 By Mouth Once Daily 9)  Alendronate Sodium 70 Mg Tabs (Alendronate Sodium) .[name].. 1 Weekly With @ Least 8 Oz of Water Only; Upright For > 1 Hr 10)  Coq10 200 Mg Caps (Coenzyme Q10) .[name].. 1 By Mouth Once Daily 11)  Klonopin 0.5 Mg Tabs (Clonazepam) .... As Needed 12)  Reglan 10 Mg Tabs (Metoclopramide Hcl) .... As Needed 13)  Phazyme 180 Mg Caps (Simethicone) .... As Needed 14)  Beano  Tabs (Alpha-D-Galactosidase) .... As Needed  Allergies: 1)  Codeine 2)  * Codiene  Review of Systems Resp:  Denies chest pain with inspiration, coughing up blood, shortness of breath, and wheezing.  Physical Exam  General:  well-nourished,in no acute distress; alert,appropriate and cooperative throughout examination Lungs:  Normal respiratory effort, chest expands symmetrically. Lungs are clear to auscultation, no crackles or wheezes. Heart:  normal rate, regular rhythm, no gallop, no rub, no JVD, and grade1  /6 systolic murmur.   Abdomen:  Bowel sounds positive,abdomen soft and non-tender without masses, organomegaly or hernias noted. Pulses:  R and L carotid,radial,dorsalis pedis and posterior tibial pulses are full and equal  bilaterally Extremities:  No clubbing, cyanosis, edema. Deformity noted  of R medial gastrocnemius head ( PMH of rupture);normal full range of motion of all joints.  + Homan's LLE. Effusion of L knee. R calf 32.5 cm & L calf 33.5 cm 15 cm below IPM. Neurologic:  alert & oriented X3, strength normal in all extremities, and DTRs symmetrical and normal.   Skin:  Intact without suspicious lesions or rashes Psych:  memory intact for recent and remote, normally interactive, and good eye contact.     Impression & Recommendations:  Problem # 1:  CALF PAIN, BILATERAL (ICD-729.5)  ? + Homan's L calf; R/O DVT vs. Popliteal Cyst with rupture  Orders: Radiology  Referral (Radiology) Venipuncture (18299) TLB-CK Total Only(Creatine Kinase/CPK) (82550-CK) TLB-Sedimentation Rate (ESR) (85652-ESR)  Problem # 2:  JOINT EFFUSION, LEFT KNEE (ICD-719.06)  Orders: Venipuncture (37169) TLB-Uric Acid, Blood (84550-URIC) TLB-Sedimentation Rate (ESR) (85652-ESR) T-Rheumatoid Factor (67893-81017)  Problem # 3:  ARTHRALGIA (ICD-719.40)  multiple sites in lower extremities  Orders: Venipuncture (51025) TLB-Uric Acid, Blood (84550-URIC) TLB-Sedimentation Rate (ESR) (85652-ESR) T-Rheumatoid Factor (85277-82423)  Complete Medication List: 1)  Lipitor 40 Mg Tabs (Atorvastatin calcium) .... 1/2 by mouth daily 2)  Plavix 75 Mg Tabs (Clopidogrel bisulfate) .[name].. 1 by mouth qd 3)  Adult Aspirin Low Strength 81 Mg Tbdp (Aspirin) .[name].. 1 by mouth once daily 4)  Metoprolol Tartrate 25 Mg Tabs (Metoprolol tartrate) .... Take 1/2 tablet by mouth twice a day 5)  Nitroglycerin 0.4 Mg Subl (Nitroglycerin) .... Place 1 tablet under tongue as directed 6)  Multivitamins Tabs (Multiple vitamin) .[name].. 1 by mouth once daily 7)  Oscal (vit D3 600/calcium 500mg )  .[name].. 1 by mouth once daily 8)  Vitamin D3 2000 Unit Caps (Cholecalciferol) .... 2 by mouth once daily 9)  Alendronate Sodium 70 Mg Tabs (Alendronate sodium) .[name] KitchenMarland KitchenMarland Kitchen 1 weekly with @ least 8 oz of water only; upright for > 1 hr 10)  Coq10 200 Mg Caps (Coenzyme q10) .[name].. 1 by mouth once daily 11)  Klonopin 0.5 Mg Tabs (Clonazepam) .... As needed 12)  Reglan 10 Mg Tabs (Metoclopramide hcl) .... As needed 13)  Phazyme 180 Mg Caps (Simethicone) .... As needed 14)  Beano Tabs (Alpha-d-galactosidase) .... As needed  Patient Instructions: 1)  Orthopedic consult if [place] & labs are negative. 2)  Take 400-600mg  of Ibuprofen (Advil, Motrin) with food every 4-6 hours as needed for relief of pain  until studies completed   Orders Added: 1)  Est. Patient Level IV [53614] 2)  Radiology Referral [Radiology] 3)  Venipuncture  [43154] 4)  TLB-CK Total Only(Creatine Kinase/CPK) [82550-CK] 5)  TLB-Uric Acid, Blood [84550-URIC] 6)  TLB-Sedimentation Rate (ESR) [85652-ESR] 7)  T-Rheumatoid Factor [00867-61950]

## 2010-07-16 NOTE — Letter (Signed)
[facility]   [facility] By: Lanelle Bal 11/30/2009 09:53:20  _____________________________________________________________________  External Attachment:    Type:   Image     Comment:   External Document

## 2010-07-16 NOTE — Assessment & Plan Note (Signed)
Summary: 414.01 272.0  12 month   Visit Type:  Follow-up Primary Provider:  Marga Melnick MD  CC:  CAD.  History of Present Illness: The patient returns for followup he is known coronary disease. Since I last saw him as he has had no new cardiovascular problems. He does occasionally get some chest pain with emotional stress but this is unchanged from previous years. He had some intermittent back pain with the last episode being about 5 weeks ago. He said some mild dyspnea after working for about an hour. However, he has had no symptoms reminiscent of views previous angina. He thinks he's done about the same as he had last year. He is more limited because he has some left knee pain and is undergoing evaluation of this. However, he still gets out and does some yard work and walking and tries to stay active.  Current Medications (verified): 1)  Lipitor 40 Mg  Tabs (Atorvastatin Calcium) .... 1/2 By Mouth Daily 2)  Plavix 75 Mg  Tabs (Clopidogrel Bisulfate) .[name].. 1 By Mouth Qd 3)  Adult Aspirin Low Strength 81 Mg Tbdp (Aspirin) .[name].. 1 By Mouth Once Daily 4)  Metoprolol Tartrate 25 Mg Tabs (Metoprolol Tartrate) .... Take 1/2 Tablet By Mouth Twice A Day 5)  Nitroglycerin 0.4 Mg Subl (Nitroglycerin) .... Place 1 Tablet Under Tongue As Directed 6)  Multivitamins  Tabs (Multiple Vitamin) .[name].. 1 By Mouth Once Daily 7)  Oscal (Vit D3 600/calcium 500mg ) .[name].. 1 By Mouth Once Daily 8)  Vitamin D3 2000 Unit Caps (Cholecalciferol) .... 2 By Mouth Once Daily 9)  Coq10 200 Mg Caps (Coenzyme Q10) .... 4 Times A Week 10)  Klonopin 0.5 Mg Tabs (Clonazepam) .... As Needed 11)  Reglan 10 Mg Tabs (Metoclopramide Hcl) .... As Needed 12)  Phazyme 180 Mg Caps (Simethicone) .... As Needed 13)  Beano  Tabs (Alpha-D-Galactosidase) .... As Needed  Allergies (verified): 1)  Codeine 2)  * Codiene  Past History:  Past Medical History: Reviewed history from 09/21/2009 and no changes  required. Hyperlipidemia Hypertension Coronary artery disease Gilbert's Syndrome Diverticulosis, colon Osteopenia: T score -1.9 @ hip(femoral neck), 2010  Past Surgical History: Reviewed history from 09/21/2009 and no changes required. CABG (2003.  The last catheterization was in August 2008 demonstrated a LIMA to the LAD which was patent, there was an atretic saphenous vein graft to the diagonal, the LAD had a 90% stenosis in the large calcified segment, the diagonal had ostial 70% stenosis, the circumflex had a ramus intermediate with ostial 25% stenosis, the right coronary artery was dominant.  There was an ulcerated 80-90% stenosis.  The EF was 65%. He had a drug-eluting stent placed to the right coronary artery), Colonoscopy :Diverticulosis 2007, Dr Jarold Motto Cervical fusion C5-6, Dr Jeral Fruit Cholecystectomy Hemorrhoidectomy  Review of Systems       As stated in the HPI and negative for all other systems.   Vital Signs:  Patient profile:   77 year old male Height:      64 inches Weight:      166 pounds BMI:     28.60 Pulse rate:   73 / minute Resp:     16 per minute BP sitting:   124 / 78  (right arm)  Vitals Entered By: Marrion Coy, CNA (April 23, 2010 10:15 AM)  Physical Exam  General:  Well developed, well nourished, in no acute distress. Head:  normocephalic and atraumatic Eyes:  PERRLA/EOM intact; conjunctiva and lids normal. Mouth:  Dentures.  Oral mucosa normal. Neck:  Neck supple, no JVD. No masses, thyromegaly or abnormal cervical nodes. Chest Wall:  Well-healed sternotomy scar Heart:  Non-displaced PMI, chest non-tender; regular rate and rhythm, S1, S2 without murmurs, rubs or gallops. Carotid upstroke normal, no bruit. Normal abdominal aortic size, no bruits. Femorals normal pulses, no bruits. Pedals normal pulses. No edema, no varicosities. Abdomen:  Bowel sounds positive; abdomen soft and non-tender without masses, organomegaly, or hernias noted. No  hepatosplenomegaly. Msk:  Back normal, normal gait. Muscle strength and tone normal. Extremities:  No clubbing or cyanosis. Neurologic:  Alert and oriented x 3. Skin:  Intact without lesions or rashes. Cervical Nodes:  no significant adenopathy Axillary Nodes:  no significant adenopathy Inguinal Nodes:  no significant adenopathy Psych:  Normal affect.   EKG  Procedure date:  04/23/2010  Findings:      His Rhythm, Rate 70 to, Axis within Normal Limits, Intervals within Normal Limits, No Acute ST-T wave Changes  Impression & Recommendations:  Problem # 1:  CORONARY ARTERY DISEASE (ICD-414.00) After careful discussion the patient denied reading he has had no new symptoms since his 2009 stress test. Therefore, no further testing is indicated and we will continue with risk reduction. Orders: EKG w/ Interpretation (93000)  Problem # 2:  HYPERTENSION, ESSENTIAL NOS (ICD-401.9) His blood pressure is controlled. He will continue with medications as listed.  Problem # 3:  HYPERLIPIDEMIA (ICD-272.2) His lipids in April were excellent with an HDL 48 LDL 70. He will continue his current regimen and check this again in the spring.  Patient Instructions: 1)  Your physician recommends that you schedule a follow-up appointment in: 12 MONTHS WITH DR [facility] 2)  Your physician recommends that you continue on your current medications as directed. Please refer to the Current Medication list given to you today.

## 2010-07-16 NOTE — Miscellaneous (Signed)
Summary: Orders Update  Clinical Lists Changes  Orders: Added new Referral order of Orthopedic Referral (Ortho) - Signed 

## 2010-07-16 NOTE — Miscellaneous (Signed)
Summary: Orders Update  Clinical Lists Changes  Orders: Added new Test order of Venous Duplex Lower Extremity (Venous Duplex Lower) - Signed 

## 2010-07-16 NOTE — Progress Notes (Signed)
Summary: FYI/[name]  Phone Note Call from Patient   Summary of Call: Pt called and stated that he is out of town. He is coughing a lot. Experencing pain in his throat. He states that it has been going for 6-7 days. Pt is requesting ATB. I informed pt that he should be seen at an UC. Pt states that he has tried guaifessin that he had. He states that he is afraid to take this medication. I informed pt that he could try Sudafed OTC. Pt states that he may try a [facility]. Army Fossa CMA  November 16, 2009 10:37 AM   Follow-up for Phone Call        due to duration of symptoms UC indicated; avoid decongestants Follow-up by: Marga Melnick MD,  November 16, 2009 2:16 PM  Additional Follow-up for Phone Call Additional follow up Details #1::        Informed pt to not take any decongestants at all. Pt understood. Informed pt that he needs to go to an UC to be seen. Pt was very upset that he was unable to get an ATB from Dr.Genora Arp. But pt states that he will go to an UC to be seen. Army Fossa CMA  November 16, 2009 2:28 PM      Appended Document: FYI/[name] Illness for 6-7 days in context CAD necessutates UC visit.

## 2010-07-18 ENCOUNTER — Encounter: Payer: Self-pay | Admitting: Internal Medicine

## 2010-07-18 ENCOUNTER — Ambulatory Visit (INDEPENDENT_AMBULATORY_CARE_PROVIDER_SITE_OTHER): Payer: 59 | Admitting: Internal Medicine

## 2010-07-18 ENCOUNTER — Other Ambulatory Visit: Payer: Self-pay | Admitting: Internal Medicine

## 2010-07-18 DIAGNOSIS — K219 Gastro-esophageal reflux disease without esophagitis: Secondary | ICD-10-CM

## 2010-07-18 DIAGNOSIS — M546 Pain in thoracic spine: Secondary | ICD-10-CM

## 2010-07-18 LAB — CBC WITH DIFFERENTIAL/PLATELET
Basophils Relative: 0.5 % (ref 0.0–3.0)
Eosinophils Absolute: 0.5 10*3/uL (ref 0.0–0.7)
Hemoglobin: 15.9 g/dL (ref 13.0–17.0)
Lymphocytes Relative: 13.1 % (ref 12.0–46.0)
MCHC: 34.9 g/dL (ref 30.0–36.0)
Monocytes Relative: 7.7 % (ref 3.0–12.0)
Neutrophils Relative %: 71.9 % (ref 43.0–77.0)
RBC: 4.95 Mil/uL (ref 4.22–5.81)
WBC: 7.9 10*3/uL (ref 4.5–10.5)

## 2010-07-18 LAB — LIPASE: Lipase: 42 U/L (ref 11.0–59.0)

## 2010-07-24 ENCOUNTER — Encounter (INDEPENDENT_AMBULATORY_CARE_PROVIDER_SITE_OTHER): Payer: Self-pay | Admitting: *Deleted

## 2010-07-24 ENCOUNTER — Encounter: Payer: Self-pay | Admitting: Internal Medicine

## 2010-07-24 ENCOUNTER — Other Ambulatory Visit (INDEPENDENT_AMBULATORY_CARE_PROVIDER_SITE_OTHER): Payer: 59

## 2010-07-24 DIAGNOSIS — Z1211 Encounter for screening for malignant neoplasm of colon: Secondary | ICD-10-CM

## 2010-07-24 NOTE — Assessment & Plan Note (Signed)
Summary: C/O esopagus issues/dysphagia/kb   Vital Signs:  Patient profile:   76 year old male Weight:      163.6 pounds BMI:     28.18 Temp:     98.1 degrees F oral Pulse rate:   76 / minute Resp:     15 per minute BP sitting:   122 / 74  (left arm) Cuff size:   large  Vitals Entered By: Shonna Chock CMA (July 18, 2010 12:04 PM) CC: 1.) Digestive concerns    2.) Back pain, off/on, Heartburn   Primary Care Provider:  Marga Melnick MD  CC:  1.) Digestive concerns    2.) Back pain, off/on, and Heartburn.  History of Present Illness:      This is a 76 year old man who presents with Heartburn X 15 days. The patient reports acid reflux, but denies sour taste in mouth, epigastric pain, chest pain, trouble swallowing, weight loss, and weight gain.  The patient denies the following alarm features: melena, dysphagia, hematemesis, and vomiting.  Symptoms are worse with lying down after a meal & waist flexion.  PMH of  EGD remotely; PMH of ulcer.  The patient has found the following treatments to be  partially effective: an H2 blocker, OTC Ranitidine 75 mg  up to  2 two times a day .  He is on Alendronate.       Back Pain:  The patient also presents with Back pain intermittently for 6 weeks.  The patient denies fever, chills, weakness, loss of sensation, fecal incontinence, urinary incontinence, and urinary retention.  The pain is located in the mid thoracic back; it does not radiate. There are no triggers for this; no meds taken.    Current Medications (verified): 1)  Lipitor 40 Mg  Tabs (Atorvastatin Calcium) .... 1/2 By Mouth Daily 2)  Plavix 75 Mg  Tabs (Clopidogrel Bisulfate) .[name].. 1 By Mouth Qd 3)  Adult Aspirin Low Strength 81 Mg Tbdp (Aspirin) .[name].. 1 By Mouth Once Daily 4)  Metoprolol Tartrate 25 Mg Tabs (Metoprolol Tartrate) .... Take 1/2 Tablet By Mouth Twice A Day 5)  Nitroglycerin 0.4 Mg Subl (Nitroglycerin) .... Place 1 Tablet Under Tongue As Directed 6)  Multivitamins  Tabs  (Multiple Vitamin) .[name].. 1 By Mouth Once Daily 7)  Oscal (Vit D3 600/calcium 500mg ) .[name].. 1 By Mouth Once Daily 8)  Vitamin D3 2000 Unit Caps (Cholecalciferol) .... 2 By Mouth Once Daily 9)  Coq10 200 Mg Caps (Coenzyme Q10) .... 4 Times A Week 10)  Klonopin 0.5 Mg Tabs (Clonazepam) .... As Needed 11)  Reglan 10 Mg Tabs (Metoclopramide Hcl) .... As Needed 12)  Phazyme 180 Mg Caps (Simethicone) .... As Needed 13)  Beano  Tabs (Alpha-D-Galactosidase) .... As Needed  Allergies: 1)  Codeine 2)  * Codiene  Physical Exam  General:  well-nourished,in no acute distress; alert,appropriate and cooperative throughout examination Eyes:  No corneal or conjunctival inflammation noted. Arcus senilis. No icterus Mouth:  Oral mucosa and oropharynx without lesions or exudates.  Upper plate; lower partial. No pharyngeal erythema.   Lungs:  Normal respiratory effort, chest expands symmetrically. Lungs are clear to auscultation, no crackles or wheezes. Heart:  Normal rate and regular rhythm. S1 and S2 normal without gallop, murmur, click, rub. S4 with slurring Abdomen:  Bowel sounds positive,abdomen soft and  slightly tender  @ epigastrium without masses, organomegaly or hernias noted. Msk:  No deformity or scoliosis noted of thoracic or lumbar spine but R thhoracic muscles >  L. He lay down & sat up w/o help Pulses:  R and L carotid,radial,dorsalis pedis and posterior tibial pulses are full and equal bilaterally Extremities:  No clubbing, cyanosis, edema. Neg SLR Neurologic:  alert & oriented X3, strength normal in all extremities, gait normal, and DTRs symmetrical and normal.   Skin:  Intact without suspicious lesions or rashes. No jaundice Cervical Nodes:  No lymphadenopathy noted Axillary Nodes:  No palpable lymphadenopathy   Impression & Recommendations:  Problem # 1:  GERD (ICD-530.81)  Exacerbation , ? from Alendronate  Orders: Venipuncture (09811) TLB-CBC Platelet - w/Differential  (85025-CBCD) TLB-Amylase (82150-AMYL) TLB-Lipase (83690-LIPASE) TLB-H. Pylori Abs(Helicobacter Pylori) (86677-HELICO) Prescription Created Electronically [phone])  His updated medication list for this problem includes:    Ranitidine Hcl 150 Mg Caps (Ranitidine hcl) .[name]... 1 two times a day pre meals  Problem # 2:  BACK PAIN, THORACIC REGION (ICD-724.1) DD His updated medication list for this problem includes:    Adult Aspirin Low Strength 81 Mg Tbdp (Aspirin) .[name]... 1 by mouth once daily  Complete Medication List: 1)  Lipitor 40 Mg Tabs (Atorvastatin calcium) .... 1/2 by mouth daily 2)  Plavix 75 Mg Tabs (Clopidogrel bisulfate) .[name].. 1 by mouth qd 3)  Adult Aspirin Low Strength 81 Mg Tbdp (Aspirin) .[name].. 1 by mouth once daily 4)  Metoprolol Tartrate 25 Mg Tabs (Metoprolol tartrate) .... Take 1/2 tablet by mouth twice a day 5)  Nitroglycerin 0.4 Mg Subl (Nitroglycerin) .... Place 1 tablet under tongue as directed 6)  Multivitamins Tabs (Multiple vitamin) .[name].. 1 by mouth once daily 7)  Oscal (vit D3 600/calcium 500mg )  .[name].. 1 by mouth once daily 8)  Vitamin D3 2000 Unit Caps (Cholecalciferol) .... 2 by mouth once daily 9)  Coq10 200 Mg Caps (Coenzyme q10) .... 4 times a week 10)  Klonopin 0.5 Mg Tabs (Clonazepam) .... As needed 11)  Reglan 10 Mg Tabs (Metoclopramide hcl) .... As needed 12)  Phazyme 180 Mg Caps (Simethicone) .... As needed 13)  Beano Tabs (Alpha-d-galactosidase) .... As needed 14)  Ranitidine Hcl 150 Mg Caps (Ranitidine hcl) .[name].. 1 two times a day pre meals  Patient Instructions: 1)  Stop Alendronate. Complete stool cards. 2)  Avoid foods high in acid (tomatoes, citrus juices, spicy foods). Avoid eating within two hours of lying down or before exercising. Do not over eat; try smaller more frequent meals. Elevate head of bed twelve inches when sleeping. 3)  Take 650-1000mg  of Tylenol every 4-6 hours as needed for relief of pain or comfort of fever AVOID taking more than  4000mg   in a 24 hour period (can cause liver damage in higher doses). Prescriptions: RANITIDINE HCL 150 MG CAPS (RANITIDINE HCL) 1 two times a day pre meals  #60 x 2   Entered and Authorized by:   Marga Melnick MD   Signed by:   Marga Melnick MD on 07/18/2010   Method used:   Electronically to        [facility] (retail)       [address]       [place], [place]  [address]       Ph: [phone]       Fax: [phone]   RxID:   [id number]    Orders Added: 1)  Est. Patient Level IV [34742] 2)  Venipuncture [59563] 3)  TLB-CBC Platelet - w/Differential [85025-CBCD] 4)  TLB-Amylase [82150-AMYL] 5)  TLB-Lipase [83690-LIPASE] 6)  TLB-H. Pylori  Abs(Helicobacter Pylori) [86677-HELICO] 7)  Prescription Created Electronically [phone]

## 2010-07-25 ENCOUNTER — Telehealth: Payer: Self-pay | Admitting: Cardiology

## 2010-08-01 NOTE — Progress Notes (Signed)
Summary: refill  Phone Note Refill Request Call back at Work Phone [phone] Message from:  Patient on July 25, 2010 9:18 AM  Refills Requested: Medication #1:  PLAVIX 75 MG  TABS 1 by mouth qd [facility] [phone] pt wants to know if there is a generic brand for Plavix pt wish to get a call back about the generic brand  Initial call taken by: Judie Grieve,  July 25, 2010 9:19 AM  Follow-up for Phone Call        Pt notified there is no generic for plavix. Sent in RX to pharmacy. Marrion Coy, CNA  July 25, 2010 11:07 AM  Follow-up by: Marrion Coy, CNA,  July 25, 2010 11:07 AM    Prescriptions: PLAVIX 75 MG  TABS (CLOPIDOGREL BISULFATE) 1 by mouth qd  #90 x 2   Entered by:   Marrion Coy, CNA   Authorized by:   Rollene Rotunda, MD, [facility]   Signed by:   Marrion Coy, CNA on 07/25/2010   Method used:   Faxed to ...       [facility] (mail-order)             , [place]         Ph: [phone]       Fax: [phone]   RxID:   [id number]

## 2010-08-01 NOTE — Letter (Signed)
Summary: Results Follow up Letter  [facility]  [address] [place], [place] [address]   Phone: [phone]  Fax: [phone]    07/24/2010 MRN: [id number]  [facility] [address] [place], [place]  [address]  [place]  Dear [name],  The following are the results of your recent test(s):  Test         Result    Pap Smear:        Normal _____  Not Normal _____ Comments: ______________________________________________________ Cholesterol: LDL(Bad cholesterol):         Your goal is less than:         HDL (Good cholesterol):       Your goal is more than: Comments:  ______________________________________________________ Mammogram:        Normal _____  Not Normal _____ Comments:  ___________________________________________________________________ Hemoccult:        Normal __X___  Not normal _______ Comments:    _____________________________________________________________________ Other Tests:    We routinely do not discuss normal results over the telephone.  If you desire a copy of the results, or you have any questions about this information we can discuss them at your next office visit.   Sincerely,

## 2010-09-17 LAB — DIFFERENTIAL
Basophils Absolute: 0.1 10*3/uL (ref 0.0–0.1)
Basophils Relative: 1 % (ref 0–1)
Eosinophils Relative: 1 % (ref 0–5)
Monocytes Absolute: 0.3 10*3/uL (ref 0.1–1.0)
Monocytes Relative: 3 % (ref 3–12)

## 2010-09-17 LAB — CBC
HCT: 46.1 % (ref 39.0–52.0)
Hemoglobin: 16.3 g/dL (ref 13.0–17.0)
MCHC: 35.2 g/dL (ref 30.0–36.0)
RDW: 11.6 % (ref 11.5–15.5)

## 2010-09-17 LAB — BASIC METABOLIC PANEL
CO2: 23 mEq/L (ref 19–32)
Chloride: 102 mEq/L (ref 96–112)
Glucose, Bld: 163 mg/dL — ABNORMAL HIGH (ref 70–99)
Potassium: 4.6 mEq/L (ref 3.5–5.1)
Sodium: 140 mEq/L (ref 135–145)

## 2010-09-17 LAB — URINALYSIS, ROUTINE W REFLEX MICROSCOPIC
Bilirubin Urine: NEGATIVE
Hgb urine dipstick: NEGATIVE
Ketones, ur: 15 mg/dL — AB
Nitrite: NEGATIVE
pH: 7 (ref 5.0–8.0)

## 2010-10-29 NOTE — Assessment & Plan Note (Signed)
[facility]                            CARDIOLOGY OFFICE NOTE   NAME:[name]                     MRN:          [id number]  DATE:02/25/2007                            DOB:          [birth year]    PRIMARY CARE PHYSICIAN:  Dr. Alwyn Ren.   REASON FOR PRESENTATION:  Evaluate patient with recent non-Q-wave  myocardial infarction.   HISTORY OF PRESENT ILLNESS:  The patient presents after being  hospitalized last month with non-Q-wave myocardial infarction.  Cardiac  catheterization at that time demonstrated patent LIMA to the LAD which  had been placed in 2003 and an atretic saphenous vein graft to a  diagonal.  The LAD had a proximal 90% stenosis in a large calcified  segment.  The diagonal had an ostial 70% stenosis.  The circumflex had a  ramus intermediate which had an ostial 25% stenosis.  The right coronary  artery was a dominant vessel.  There was an ulcerated 80-90% lesion.  The EF was 65%.  He had placement of a drug-eluting stent to the right  coronary artery.   The patient has done well since that time.  He has had some fleeting  anterior chest discomfort and some discomforts that may last up to 2  minutes.  However, he has not had any symptoms similar to his presenting  complaint that day.  He has had no severe symptoms and none associated  with nausea, vomiting, or diaphoresis.  These happened at rest.  He has  not felt compelled to take a nitroglycerin.  He has been getting out and  doing a lot around his house and not bringing on any of these symptoms.  He denies any new shortness of breath.  He has not been having any PND  or orthopnea.  He has not been having any palpitations, presyncope, or  syncope.   PAST MEDICAL HISTORY:  1. Coronary artery disease status post CABG in 2003.  2. Dyslipidemia x15 years.  3. Cervical disk disease.  4. Previous tobacco use.  5. Peptic ulcer disease.  6. Cervical spine fusion.  7.  Cholecystectomy.  8. Hemorrhoidectomy.   ALLERGIES:  HE IS INTOLERANT TO CODEINE.   MEDICATIONS:  1. Multivitamin.  2. Aspirin 325 mg daily.  3. Plavix 75 mg daily.  4. Toprol-XL 25 mg daily.  5. Lipitor 20 mg daily.  6. Finasteride 5 mg every other day.  7. Fluoxetine 10 mg daily.   REVIEW OF SYSTEMS:  As stated in the HPI and otherwise negative for  other systems.   PHYSICAL EXAMINATION:  GENERAL:  The patient is in no distress.  VITAL SIGNS:  Blood pressure 119/70, heart rate 67 and regular.  Weight  164 pounds, body mass index 27.  HEENT:  Eyelids unremarkable.  Pupils are equal, round, and reactive to  light.  Fundi not visualized.  Oral mucosa unremarkable.  NECK:  No jugular venous distension at 45 degrees.  Carotid upstrokes  brisk and symmetric.  No bruits, no thyromegaly.  LYMPHATICS:  No nodes.  LUNGS:  Clear to auscultation bilaterally.  BACK:  No costovertebral angle  tenderness.  CHEST:  Well-healed sternotomy scar.  HEART:  PMI not displaced or sustained.  S1 and S2 within normal limits.  No S3, no S4, no clicks, no rubs, no murmurs.  ABDOMEN:  Flat, positive bowel sounds, normal in frequency and pitch.  No bruits, no rebound, no guarding.  No midline pulsatile masses.  No  organomegaly.  SKIN:  No rashes, no nodules.  EXTREMITIES:  2+ pulses, no edema.   EKG:  Sinus rhythm, rate 67, axis within normal limits, intervals within  normal limits, poor anterior R wave progression, no acute ST-T wave  changes.   ASSESSMENT AND PLAN:  1. Coronary disease.  The patient is doing well with respect to this.      No further cardiovascular testing is suggested.  He will continue      with secondary risk reduction.  2. Dyslipidemia.  He is due to have lipid profile done soon by Dr.      Alwyn Ren.  I would suggest an low-density lipoprotein of less than 70      and a high-density lipoprotein of greater than 40 as a goal given      his recurrent disease despite not severe  risk factors.  3. Risk reduction.  We discussed cardiac rehabilitation.  He says he      cannot afford to do this and will do it on his own.  I gave him a      prescription based on his 75% of his age predicted maximum heart      rate.  He is to start to achieve this slowly.  He is not to exceed      a moderate level intensity exercise regimen.   FOLLOW UP:  We will see him back in 4 months or sooner if needed.     Rollene Rotunda, MD, [facility]  Electronically Signed    JH/MedQ  DD: 02/25/2007  DT: 02/25/2007  Job #: [id number]   cc:   Titus Dubin. Alwyn Ren, MD,FACP,FCCP

## 2010-10-29 NOTE — Op Note (Signed)
NAME:  [name], [name] NO.:  [id number]   MEDICAL RECORD NO.:  [id number]          PATIENT TYPE:  OIB   LOCATION:  6524                         FACILITY:  [facility]   PHYSICIAN:  Rollene Rotunda, MD, FACCDATE OF BIRTH:  [birth year]   DATE OF PROCEDURE:  02/08/2007  DATE OF DISCHARGE:                               OPERATIVE REPORT   PRIMARY CARE PHYSICIAN:  Titus Dubin. Alwyn Ren, MD,FACP,FCCP.   CARDIOLOGIST:  Dr. Jerral Bonito.   PROCEDURE:  Left heart catheterization/coronary arteriography.   INDICATION:  Evaluate patient with non-Q-wave myocardial infarction.  He  is status post CABG with a LIMA to an LAD and SVG to diagonal in 2003.   DESCRIPTION OF PROCEDURE:  Left heart catheterization performed to right  femoral artery.  Artery was cannulated using anterior wall puncture and  #6-French arterial sheath was inserted via the modified Seldinger  technique.  Preformed Judkins and a pigtail catheter utilized.  The  patient tolerated procedure well and left lab in stable condition.   RESULTS:   HEMODYNAMICS:  LV 111/14, AO 113/79.   CORONARIES:  Left main had luminal irregularities.  The LAD had proximal  90% stenosis and a large calcified segment.  This compromised first  diagonal which was large to moderate size.  This diagonal had ostial 70%  stenosis.  Circumflex in the AV groove had luminal irregularities.  The  ramus intermediate which was moderate had ostial 25% stenosis.  The  first and second obtuse marginals were small and normal.  There is a  posterolateral which was large and normal.  The right coronary artery is  a dominant vessel.  There was proximity hazy ulcerated looking lesion  which was 80% stenosed and appeared to be the culprit lesion.  PDA was  moderate size normal.  Left grafts; a LIMA to the LAD was widely patent.  Saphenous vein graft to the diagonal was atretic.   Left ventriculogram was obtained in the RAO projection.  EF 65%, normal  wall  motion.   CONCLUSION:  High grade proximal right coronary artery stenosis.  Atretic graft to the diagonal.  Well-preserved ejection fraction.   PLAN:  The culprit lesion appears to be the proximal right coronary  lesion.  Reviewed this with Dr. Juanda Chance.  He agrees and the plan is to  proceed with percutaneous revascularization of the right coronary  artery.      Rollene Rotunda, MD, [facility]  Electronically Signed     JH/MEDQ  D:  02/08/2007  T:  02/08/2007  Job:  [id number]   cc:   Titus Dubin. Alwyn Ren, MD,FACP,FCCP  [phone] [place]  [place] [address]

## 2010-10-29 NOTE — H&P (Signed)
NAME:  ARINZE, RIVADENEIRA NO.:  [id number]   MEDICAL RECORD NO.:  [id number]          PATIENT TYPE:  EMS   LOCATION:  ED                           FACILITY:  [facility]   PHYSICIAN:  Rollene Rotunda, MD, FACCDATE OF BIRTH:  Jul 12, 1934   DATE OF ADMISSION:  02/06/2007  DATE OF DISCHARGE:                              HISTORY & PHYSICAL   PRIMARY CARE PHYSICIAN:  Dr. Alwyn Ren.   CARDIOLOGIST:  Dr. Myrtis Ser.   REASON FOR PRESENTATION:  Evaluate patient with back pain.   HISTORY OF PRESENT ILLNESS:  Patient is a pleasant 76 year old gentleman  originally from [place].  He has a history of coronary disease with  single vessel disease and double bypass in 2003.  At that time, he said  he had back pain as his unstable angina.  He had done well since that  time and does not report any other testing.  He has been followed  closely by Dr. Alwyn Ren.  He does not exercise, but he has been an active  gentleman.  He is a little bit limited by neck pain.  He does do some  yard work and climbs stairs routinely.  He said that he had been doing  well and had not been having any chest discomfort or shortness of  breath.  He did have a late dinner last night.  He stayed up to watch  [place] play [place] for the Olympic gold medal.  His native [place]  won 1 to nothing.  At 2:00 a.m., he developed severe pain between his  shoulder blades.  This is the same location as his previous angina,  although he thought it was a slightly different quality.  This  discomfort was pressure or gripping.  It was not sharp.  It was an 8/10  in intensity.  He had a little radiation down into his right arm.  It  was accompanied by significant diaphoresis.  He had no nausea or  vomiting.  He did not take anything to relieve it.  He did come to  [employer], where he had this discomfort off and on.  The episodes  would come and go but last 5 or 10 minutes each time.  Since being in  the emergency room, he has had  none of the severe episodes.  He still  feels a little mild discomfort.  His EKG demonstrated no acute findings;  however, his point-of-care markers demonstrated the first troponins to  be 0.25.   PAST MEDICAL HISTORY:  1. Coronary artery disease (high-grade 95% stenosis of the proximal      LAD involving the take-off of the first and second diagonal      branches).  2. Hyperlipidemia for about 15 years.  3. Cervical disk disease.  4. Previous tobacco use.  5. Peptic ulcer disease.   PAST SURGICAL HISTORY:  1. Cervical spine fusion.  2. Cholecystectomy.  3. Hemorrhoidectomy.   ALLERGIES:  He is intolerant of CODEINE.   MEDICATIONS:  1. Lipitor 20 mg daily.  2. Aspirin 81 mg daily.  3. Claritin 10 mg daily.   SOCIAL HISTORY:  Patient is married.  He has two children.  He owns a  used imported car business.  He quit smoking in 1980.   FAMILY HISTORY:  Noncontributory for early coronary artery disease.   REVIEW OF SYSTEMS:  As stated in the HPI, otherwise negative for all  other systems.   PHYSICAL EXAMINATION:  Patient looks younger than his stated age.  Blood pressure 142/79, heart rate 68 and regular, afebrile.  HEENT:  Eyelids unremarkable.  Pupils are equal, round and reactive to  light.  Fundi not visualized.  Oral mucosa unremarkable.  NECK:  No jugular venous distention at 45 degrees.  Carotid upstroke  brisk and symmetric.  No bruits, no thyromegaly.  LYMPHATICS:  No cervical, axillary, or inguinal adenopathy.  LUNGS:  Clear to auscultation bilaterally.  BACK:  No costovertebral angle tenderness.  CHEST:  Unremarkable.  HEART:  PMI not displaced or sustained.  S1 and S2 within normal limits.  No S3, no S4, no clicks, rubs, or murmurs.  ABDOMEN:  Flat, positive bowel sounds.  Normal in frequency and pitch.  No bruits, rebound, guarding.  There are no midline pulsatile masses.  No hepatomegaly, no splenomegaly.  SKIN:  No rashes, no nodules.  EXTREMITIES:  Pulses  2+ throughout.  No cyanosis, no clubbing, no edema.  NEUROLOGIC:  Alert and oriented to person, place, and time.  Cranial  nerves II-XII grossly intact.  Motor grossly intact throughout.   EKG:  Sinus rhythm, rate 59, axis within normal limits, intervals within  normal limits, no acute ST-T wave changes.   LABS:  Point-of-care myoglobin 101, CK-MB 8.1, troponin 0.25.  Other  labs pending.   ASSESSMENT/PLAN:  1. Back discomfort:  The patient's back discomfort is reminiscent of      his previous angina.  He has an elevated point-of-care marker with      a troponin of 0.25.  He will be admitted to the step-down intensive      care unit, as he is still having some slight discomfort.  I will      use heparin, nitroglycerin, and aspirin.  He will be started on a      low dose beta blocker.  We will continue to cycle enzymes.  My plan      is for cardiac catheterization.  The patient      understands the risks and benefits of this procedure, having been      through it before.  He is willing to proceed.  2. Risk reduction:  We will check a lipid profile.  Check a TSH as      well.  3. Hypertension:  Blood pressure is slightly elevated, and we will      watch this and treat accordingly.      Rollene Rotunda, MD, [facility]  Electronically Signed     JH/MEDQ  D:  02/06/2007  T:  02/07/2007  Job:  [id number]   cc:   Titus Dubin. Alwyn Ren, MD,FACP,FCCP  [phone] [place]  [place] [address]

## 2010-10-29 NOTE — Assessment & Plan Note (Signed)
HEALTHCARE                            CARDIOLOGY OFFICE NOTE   NAME:[name], [name]                     MRN:          [id number]  DATE:02/15/2008                            DOB:          [birth year]    PRIMARY CARE PHYSICIAN:  Titus Dubin. Alwyn Ren, MD,FACP,FCCP.   REASON FOR PRESENTATION:  Evaluate the patient with coronary artery  disease.   HISTORY OF PRESENT ILLNESS:  The patient is 76 years old.  I last saw  him in December 2008 and was supposed to see him again this December.  However, he has been having chest discomfort.  This happened last  Friday.  He had some discomfort that was on his right side and right  shoulder and somewhat in the middle of his back.  It comes and goes.  He  is noticing it more with some walking or moderate activity.  He did have  back pain in the past when he had his bypass and thinks this may be  similar.  It is not similar to his presentation with a non-Q wave MI in  August 2008.  He is getting some dyspnea with activity.  He is not  walking as much as I would like.  He occasionally will pedal a bicycle.  He denies any palpitation, presyncope or syncope.  He has had no resting  complaints and denies any PND or orthopnea.   PAST MEDICAL HISTORY:  Coronary artery disease (status post CABG in  2003).  The last catheterization in August 2008 demonstrated a LIMA to  the LAD which is patent, there was an atretic saphenous vein graft to  diagonal, the LAD had a 90% stenosis in a large calcified segment, the  diagonal had osteo 70% stenosis, the circumflex had a ramus intermedius  with osteo 25% stenosis, right coronary artery was dominant.  There was  an ulcerated 80-90% stenosis.  The EF was 65%.  He had a drug-eluting  stent placed to the right coronary artery, dyslipidemia x15 years,  cervical disk disease, previous tobacco use, peptic ulcer disease,  cervical spine fusion, cholecystectomy, and hemorrhoidectomy.   ALLERGIES:  Allergies intolerances CODEINE.   MEDICATIONS:  1. Plavix 75 mg daily.  2. Lipitor 20 mg daily.  3. Metoprolol 12.5 mg twice a day.  4. Aspirin 81 mg daily.  5. Vitamin D.  6. Ginseng.   REVIEW OF SYSTEMS:  As stated in the HPI and otherwise negative for  other systems.   PHYSICAL EXAMINATION:  GENERAL:  The patient is in no distress.  VITAL SIGNS:  Blood pressure 140/82, heart rate 72 and irregular, body  mass index 26, and weight 165 pounds.  HEENT:  Unremarkable, pupils equal, round, and react to light. fundi not  visualized, oral mucosa unremarkable.  NECK:  No jugular venous distension at 45 degrees, carotid upstroke  brisk and symmetrical, no bruits, no thyromegaly.  LYMPHATICS:  No cervical, axillary, or inguinal adenopathy.  LUNGS:  Decreased breath sounds but no wheezing or crackles.  BACK:  No costovertebral angle tenderness.  CHEST:  Well-healed sternotomy scar.  HEART:  PMI not  displaced or sustained, S1 and S2 within normal limits,  no S3, no S4, no clicks, no rubs, no murmurs.  ABDOMEN:  Flat, positive bowel sounds, normal in frequency and pitch, no  bruits, no rebound, no guarding, no midline pulsatile mass, no  organomegaly.  SKIN:  No rashes, no nodules.  EXTREMITIES:  2+ pulses, no edema, no cyanosis, no clubbing.  NEURO:  Oriented to place, person, and time, cranial nerves II-XII  grossly intact, motor grossly intact.   EKG, sinus rhythm, rate 67, axis within normal limits, intervals within  normal limits, poor anterior R-wave progression, no acute ST-T wave  changes.   ASSESSMENT AND PLAN:  1. Chest, the patient's chest and back discomfort have some features      consistent with new onset exertional angina (unstable angina).      Given this, the pretest probability of obstructive coronary disease      causing these symptoms is moderately high.  Screening with a stress      perfusion study is indicated.  I think the patient would be able to       walk on a treadmill for this.  2. Dyslipidemia, lipid profile.  This has not been repeated in about a      year, and we will check this when he comes back for his stress      test.  3. Risk reduction.  I again counseled him on exercise.  He is waiting      for a kick in the butt.  4. Followup.  We will see the patient in December as he was previously      scheduled or sooner based on the results of the above testing.     Rollene Rotunda, MD, [facility]  Electronically Signed    JH/MedQ  DD: 02/15/2008  DT: 02/16/2008  Job #: [id number]   cc:   Titus Dubin. Alwyn Ren, MD,FACP,FCCP

## 2010-10-29 NOTE — Discharge Summary (Signed)
NAME:  [name], [name] NO.:  [id number]   MEDICAL RECORD NO.:  [id number]          PATIENT TYPE:  INP   LOCATION:  1235                         [facility]ACILITY[facility]  [facility]   PHYSICIAN:  Rollene Rotunda, MD, FACCDATE OF BIRTH:  [birth year]   DATE OF ADMISSION:  02/06/2007  DATE OF DISCHARGE:  02/08/2007                               DISCHARGE SUMMARY   PRIMARY CARDIOLOGIST:  Dr. Antoine Poche.   PRIMARY CARE Jahzier Villalon:  Dr. Alwyn Ren.   DISCHARGE DIAGNOSIS:  Non-ST-elevation myocardial infarction.   SECONDARY DIAGNOSES:  1. Coronary artery disease.  2. Hyperlipidemia.  3. Cervical disk disease.  4. Remote tobacco abuse.  5. Peptic ulcer disease.  6. Cervical spine fusion.  7. Status post cholecystectomy.  8. Status post hemorrhoidectomy   ALLERGIES:  He is intolerant to Codeine.   PROCEDURE:  Left heart cardiac catheterization with successful PCI and  stenting of the proximal right coronary artery with placement of a 2.75  x 18-mm Promus drug-eluting stent.   HISTORY OF PRESENT ILLNESS:  A 76 year old Hispanic male with prior  history of CAD status post CABG x2 in 2003, who was in his usual state  of health until approximately 2 a.m. on February 06, 2007, when he  developed severe mid scapular discomfort similar to his previous angina  associated with diaphoresis and radiation down the right arm.  He  presented to [facility], where he was noted to have an  elevated troponin of 0.25.  The patient had intermittent episodes of  discomfort in the ER and was subsequently admitted for further  evaluation and management of non-ST-elevation MI.   HOSPITAL COURSE:  The patient peaked his CK at 86 with an MB of 20.3  and a troponin of 1.23.  Arrangements were made for cardiac  catheterization and this took place on August 25 after the patient had  uneventful weekend.  Catheterization revealed an 80% hazy stenosis in  the proximal RCA.  The LIMA to the LAD was widely patent,  while the vein  graft to diagonal was atretic.  Films were evaluated by Dr. Juanda Chance and  the decision was made to perform PCI and stenting in the proximal RCA  with placement of a 2.7 x 18-mm Promus drug-eluting stent.  The patient  tolerated this procedure well and has been ambulating post procedure  without recurrent discomfort.  He is being discharged home today in  satisfactory condition.   DISCHARGE LABS:  Hemoglobin 14.2, hematocrit 40.6, WBC 9.4, platelets  226, MCV 89.3.  Sodium 142, potassium 3.8, chloride 106, CO2 29, BUN 12,  creatinine 0.86, glucose 98.  CK 114, MB 6.1, troponin I 0.46,  Calcium  9.1.  D-dimer was 0.33.  TSH 1.393.   DISPOSITION:  The patient is being discharged home today in good  condition.   FOLLOW-UP PLANS AND APPOINTMENTS:  He has follow-up with Dr. Antoine Poche on  September 11 at 9:30 a.m..  He will follow up with Dr. hopper as needed.   DISCHARGE MEDICATIONS:  1. Aspirin 325 mg daily.  2. Plavix 75 mg daily.  3. Claritin 10 mg daily.  4. Nitroglycerin 0.4 mg sublingual p.r.n. chest pain.  5. Toprol XL 25 mg daily.  6. Lipitor 20 mg daily.  7. Finasteride 5 mg daily.  8. Fluoxetine 10 mg daily.   OUTSTANDING LAB STUDIES:  None.   DURATION OF DISCHARGE ENCOUNTER:  40 minutes, including physician time.      Nicolasa Ducking, ANP      Rollene Rotunda, MD, [facility]  Electronically Signed    CB/MEDQ  D:  02/09/2007  T:  02/09/2007  Job:  [id number]

## 2010-10-29 NOTE — Assessment & Plan Note (Signed)
[facility]                            CARDIOLOGY OFFICE NOTE   NAME:[name], [name]                     MRN:          [id number]  DATE:06/01/2008                            DOB:          03/09/1935    PRIMARY CARE PHYSICIAN:  Dr. Alwyn Ren.   REASON FOR PRESENTATION:  Evaluate the patient with coronary artery  disease.   HISTORY OF PRESENT ILLNESS:  The patient is 76 years old.  He presents  for followup of the above.  At the last visit, he was having some chest  discomfort with some worrisome features.  I send him for a stress  perfusion study which demonstrated an EF of 65% and no evidence of  ischemia or infarct.  Since that time, he has done better.  He has had  some occasional chest pains, but nothing like the discomfort he had back  in the summer of 2008 when he had unstable angina.  He is walking 3  times per week, but not routinely.  With his level of activities, he is  not having any chest pressure, neck, or arm discomfort.  He is having no  new palpitation, presyncope, or syncope.  He denies any PND or  orthopnea.   PAST MEDICAL HISTORY:  Coronary artery disease (status post CABG in  2003.  The last catheterization was in August 2008 demonstrated a LIMA  to the LAD which was patent, there was an atretic saphenous vein graft  to the diagonal, the LAD had a 90% stenosis in the large calcified  segment, the diagonal had ostial 70% stenosis, the circumflex had a  ramus intermediate with ostial 25% stenosis, the right coronary artery  was dominant.  There was an ulcerated 80-90% stenosis.  The EF was 65%.  He had a drug-eluting stent placed to the right coronary artery),  dyslipidemia x 15 years, cervical disk disease, previous tobacco use,  peptic ulcer disease, cervical spine fusion, cholecystectomy, and  hemorrhoidectomy.   ALLERGIES/INTOLERANCES:  CODEINE.   MEDICATIONS:  1. Plavix 75 mg daily.  2. Lipitor 20 mg daily.  3. Metoprolol  12.5 mg b.i.d.  4. Aspirin 81 mg daily.   REVIEW OF SYSTEMS:  As stated in the HPI and otherwise negative for  other systems.   PHYSICAL EXAMINATION:  GENERAL:  The patient is in no distress.  VITAL SIGNS:  Blood pressure 122/72, heart rate 65 and regular, and  weight 164 pounds.  HEENT:  Eyelids are unremarkable; pupils equal, round, and reactive to  light; fundi not visualized; oral mucosa unremarkable.  NECK:  No jugular venous distention at 45 degrees; carotid upstroke  brisk and symmetric; no bruits, no thyromegaly.  LYMPHATICS:  No cervical, axillary, or inguinal adenopathy.  LUNGS:  Clear to auscultation bilaterally.  BACK:  No costovertebral angle tenderness.  CHEST:  Unremarkable.  HEART:  PMI not displaced or sustained; S1 and S2 within normal limits;  no S3, no S4; no clicks, no rubs, no murmurs.  ABDOMEN:  Flat; positive bowel sounds normal in frequency and pitch; no  bruits, no rebound, no guarding; no midline pulsatile mass;  no  hepatomegaly, no splenomegaly.  SKIN:  No rashes, no nodules.  EXTREMITIES:  A 2+ pulse; no edema;  no cyanosis, no clubbing.  NEUROLOGIC:  Oriented to person, place, and time; cranial nerves II-XII  grossly intact; motor grossly intact.   EKG; sinus rhythm, rate 65, axis within normal limits, intervals within  normal limits, no acute ST-T wave changes.   ASSESSMENT AND PLAN:  1. Coronary artery disease.  The patient is having no new symptoms      consistent with unstable angina.  No further cardiovascular testing      is suggested.  He will continue with risk reduction.  2. Dyslipidemia.  He had an excellent lipid profile in the current      regimen and he will stay on this.  3. Exercise.  I encouraged him to do more routine exercise and      hopefully he will comply with this.  4. Followup.  I will see him back in about 6 months or sooner if      needed.     Rollene Rotunda, MD, [facility]  Electronically Signed    JH/MedQ  DD:  06/01/2008  DT: 06/02/2008  Job #: [id number]   cc:   Titus Dubin. Alwyn Ren, MD,FACP,FCCP

## 2010-10-29 NOTE — Assessment & Plan Note (Signed)
[facility]                            CARDIOLOGY OFFICE NOTE   NAME:[name], [name]                     MRN:          [id number]  DATE:06/03/2007                            DOB:          25-Nov-1934    REFERRING PHYSICIAN:  Titus Dubin. Hopper, MD,FACP,FCCP   REASON FOR PRESENTATION:  Evaluate patient with coronary disease and a  recent non-Q-wave myocardial infarction.   HISTORY OF PRESENT ILLNESS:  The patient is 76 years old. He has  coronary disease as described below. Since I last saw him, he has had  some discomfort in his chest. It is not like his previous angina. It  happened after he started a higher fiber diet. He felt discomfort that  would be all over. It was in his back, lower abdomen and epigastric  area. It would be fleeting. He reduced his fiber intake and actually had  improvement in this. He was not getting any exertional discomfort. He  does his activities of daily living, but does not exercise routinely. He  denies any shortness of breath and has had no PND or orthopnea. He has  had no palpitations, pre-syncope or syncope. He has his lipids followed  by Dr. Alwyn Ren and has results that are acceptable and listed below.   PAST MEDICAL HISTORY:  1. Coronary artery disease (status post coronary artery bypass graft      in 2003, hospitalized in August with a non-Q-wave myocardial      infarction and left internal mammary artery to the left anterior      descending artery was patent, there was an atretic saphenous vein      graft to diagonal. The left anterior descending artery had      approximately 90% stenosis and a large calcified segment. The      diagonal had ostial 70% stenosis, the circumflex had ramus      intermediate with ostial 25% stenosis, the right coronary artery      was dominant. There was an ulcerated 80-90% lesion. The ejection      fraction was 65%. He had a drug-eluting stent to the right coronary      artery.)  2. Dyslipidemia x15 years (cholesterol 134, HDL 39.5, LDL 66,      triglycerides 145).  3. Cervical disc disease.  4. Previous tobacco use.  5. Peptic ulcer disease.  6. Cervical spine fusion.  7. Cholecystectomy.  8. Hemorrhoidectomy.   ALLERGIES:  INTOLERANT TO CODEINE.   CURRENT MEDICATIONS:  1. Aspirin 325 mg daily.  2. Plavix 75 mg daily.  3. Toprol 25 mg daily.  4. Lipitor 20 mg daily.  5. Finasteride 5 mg every other day.  6. Fluoxetine 10 mg daily.   REVIEW OF SYSTEMS:  As stated in the HPI and otherwise negative for  other systems.   PHYSICAL EXAMINATION:  The patient is in no distress. Blood pressure  102/69, heart rate 69 and regular. Weight 166 pounds, Body Mass Index is  27.  HEENT: Eyelids unremarkable. Pupils equal, round, and reactive to light.  Fundi not visualized. Oral mucosa is unremarkable.  NECK:  No jugular venous distention at 45 degrees.  Carotid upstroke  brisk and symmetrical. No bruits, no thyromegaly.  LYMPHATICS: No cervical, axillary or inguinal adenopathy.  LUNGS: Clear to auscultation bilaterally.  BACK: No costovertebral angle tenderness.  CHEST: Well-healed sternotomy scar.  HEART: PMI not displaced or sustained. S1, S2 within normal limits. No  S3. No S4. No clicks, rub or murmurs.  ABDOMEN: Flat, positive bowel sounds, normal in frequency and pitch. No  bruits. No rebounds. No guarding. No midline pulsatile mass. No  organomegaly.  SKIN: No rashes, no nodules.  EXTREMITIES: 2+ pulses throughout. No edema.   EKG: Sinus rhythm, rate 70, axis within normal limits, intervals within  normal limits. No acute ST wave change.   ASSESSMENT/PLAN:  1. Coronary disease. The patient is having some chest discomfort that      is atypical for angina. At this point, no further cardiovascular      testing is suggested. He should continue with risk reduction.  2. Dyslipidemia. He has an excellent lipid profile. He will continue      on the medications  as listed and be followed by Dr. Alwyn Ren.  3. Risk reduction. I did tell him he could go down to an 81 mg      aspirin. I did strongly advise him and gave him some instruction on      adding an exercise regimen to his secondary prevention.  4. Followup. I will see him back in one year or sooner if needed.     Rollene Rotunda, MD, [facility]  Electronically Signed    JH/MedQ  DD: 06/03/2007  DT: 06/03/2007  Job #: [id number]   cc:   Titus Dubin. Alwyn Ren, MD,FACP,FCCP

## 2010-10-29 NOTE — Cardiovascular Report (Signed)
NAME:  [name], [name]              ACCOUNT NO.:  [id number]   MEDICAL RECORD NO.:  [id number]          PATIENT TYPE:  OIB   LOCATION:  6524                         FACILITY:  [facility]   PHYSICIAN:  Bruce R. Juanda Chance, MD, FACCDATE OF BIRTH:  1935-05-04   DATE OF PROCEDURE:  02/08/2007  DATE OF DISCHARGE:                            CARDIAC CATHETERIZATION   HISTORY:  [name] is 76 years old and has had previous bypass  surgery with a LIMA to the LAD and vein graft to the diagonal branch of  the LAD by Dr. Cornelius Moras.  He recently was admitted with chest pain and  positive enzymes consistent with a non-ST-elevation myocardial  infarction.  He was setup by Dr. Antoine Poche. His vein graft to the  diagonal was atretic although his flow to the diagonal __________  circulation was pretty good and the disease in the vein graft was old.  His LIMA graft to the LAD was good and circumflex artery was good.  He  had  tight lesion at the proximal right coronary artery estimated at  90%.  His ejection fraction was normal at 65%.  We decided to proceed  with intervention on the right coronary artery.   DESCRIPTION OF PROCEDURE:  The procedure was performed via the right  femoral artery using arterial sheath and a 6-French JR-4 guiding  catheter with side hole.  The patient was given  __________  infusion.  He had been loaded with Plavix yesterday and was given additional 300 mg  today and was also given four chewable aspirins today.  We passed the  Prowater wire across the lesion without difficulty.  We direct stented  the lesion with a 2.75 x 18 mm __________  stent deploying just one  inflation of 14 atmospheres for 30 seconds.  We then post dilated with a  3 x 15 mm Quantum Maverick balloon for three inflations at 15  atmospheres for 20 seconds each.  This resulted in slow flow down the  artery which is treated with intracoronary verapamil which resulted from  TIMI-1 to 2 flow to TIMI-3 flow.  The patient  tolerated the procedure  well and left the laboratory in satisfactory condition.  The right  femoral artery was closed with __________  silk.   RESULTS:  __________  proximal right coronary artery was estimated at  90%.  Following stenting, this improved to 0%.  The proximal edges of  the stent was probably about 3-4 mm from the ostium.   CONCLUSION:  Successful percutaneous coronary intervention of the lesion  in the proximal right coronary artery using a __________  drug-eluting  stent with improvements in her narrowing from 90% to 0%.   DISPOSITION:  The patient returned to the above angioplasty unit for  further observation.  He will probably be able to go home tomorrow.  I  recommend Plavix for at least a year.      Bruce Elvera Lennox Juanda Chance, MD, [facility]  Electronically Signed     BRB/MEDQ  D:  02/08/2007  T:  02/09/2007  Job:  [id number]   cc:   Luis Abed, MD,  Elmer Sow. Alwyn Ren, MD,FACP,FCCP  Cardiopulmonary Lab

## 2010-11-01 NOTE — Discharge Summary (Signed)
[place]  Patient:    [name], [name] Visit Number: [id number] MRN: [id number]          Service Type: MED Location: 2000 2034 01 Attending Physician:  Tressie Stalker Dictated by:   Areta Haber, P.A.-C. Admit Date:  07/22/2001 Disc. Date: 07/27/01   CC:         Salvatore Decent. Cornelius Moras, M.D.  Luis Abed, M.D. [facility]  Titus Dubin. Alwyn Ren, M.D. [facility]   Discharge Summary  HISTORY OF PRESENT ILLNESS:  This is a very pleasant 76 year old male with no previous history of heart disease who was referred to the [facility] Cardiology consultation service by Dr. Alwyn Ren for review of a routine treadmill test. The patient was referred for this test following presentation with a three week history of new onset exertional chest discomfort associated with dyspnea. He described this discomfort as originating in the mid scapular region with radiation to the mid sternum and described it as somewhat sharp. He also noted some associated dyspnea but no other symptoms. The patient also has noted some increase in his heart rate during these episodes. The patient has not symptoms at rest and the symptoms are also reported to be resolved with rest. On routine treadmill test, he was noted to have the identical symptoms early in the test with resolution of symptoms at rest. He was able to exercise six minutes of the standard Bruce protocol achieving 7.0 mets and 91% of PMHR. The serial EKG was notable for some borderline upsloping inferolateral S/T depression at peak exercise but no persistent S/T abnormalities during recovery. He was felt to require cardiac catheterization and was admitted this hospitalization for the procedure.  ALLERGIES:  CODEINE causes GI intolerance.  PAST MEDICAL HISTORY:  1. Cervical fusion C5-6.  2. Remote history of a duodenal ulcer in 1981.  3. History of Gilberts syndrome.  4. History of dyslipidemia.  5. History of hemorrhoidectomy.  For  family history, social history, review of symptoms and physical exam, please see the history and physical done at the time of admission.  HOSPITAL COURSE:  The patient was admitted for cardiac catheterization which was performed by Dr. Gerri Spore. The patient was found to have significant coronary artery disease including a 95% proximal LAD lesion. Due to his high grade lesion, the patient was felt to be a candidate for surgical revascularization and consultation with Tressie Stalker, M.D. was obtained. Dr. Cornelius Moras evaluated the patient and his studies and agreed to proceed and on July 23, 2001, the following procedure was performed: Off pump coronary artery bypass x2. The following grafts were placed: 1) Left internal mammary artery to the LAD. 2)  Saphenous vein graft to the diagonal. The patient tolerated the procedure well and was taken to the surgical intensive care unit in stable condition.  POSTOPERATIVE HOSPITAL COURSE:  The patient has done quite well. He has had all of his routine lines, monitors, and drainage devices discontinued in a standard fashion. Laboratory values have remained stable. Most recent hemoglobin and hematocrit dated July 25, 2001 and are stable at 13 and 37 respectively. Electrolytes, BUN and creatinine are all within normal limits. The patients incisions are healing well. He has tolerated a gradual increase in activity commensurate for level of his postoperative convalescence. He has had some nausea postoperatively but this is resolved. He has advanced his diet and is tolerating small meals at this point. He has complained of some fullness in his left ear but examination is overall unremarkable.  His symptoms seem to be improving over time. Overall he is felt to be quite stable for discharge on todays date, July 27, 2001.  DISCHARGE MEDICATIONS:  1. Pravachol 20 mg q.h.s.  2. Aspirin 325 mg q.d.  3. Lopressor 25 mg q. 12  4. Ultram 1-2 q. 6h p.r.n.   5. Protonix 40 mg b.i.d.  FINAL DIAGNOSES:  1. Single vessel coronary artery disease.  2. Remote duodenal ulcer.  3. History of Gilberts syndrome.  4. History of dyslipidemia.  5. History of hemorrhoidectomy.  6. History of tobacco abuse.  FOLLOW-UP:  Dr. Myrtis Ser in two weeks, Dr. Cornelius Moras March 3 at 10:15.  DISCHARGE INSTRUCTIONS:  The patient received written instructions regarding medications, activity, diet, wound care and follow-up.  CONDITION ON DISCHARGE:  Stable and improving. Dictated by:   Areta Haber, P.A.-C. Attending Physician:  Tressie Stalker DD:  07/27/01 TD:  07/27/01 Job: [id number]

## 2010-11-01 NOTE — Consult Note (Signed)
NAME:  Matthew [name], Matthew [name]                        ACCOUNT NO.:  [id number]   MEDICAL RECORD NO.:  [id number]                   PATIENT TYPE:  INP   LOCATION:  0376                                 FACILITY:  [facility]   PHYSICIAN:  Timothy E. Earlene Plater, M.D.              DATE OF BIRTH:  07-03-1934   DATE OF CONSULTATION:  02/14/2003  DATE OF DISCHARGE:                                   CONSULTATION   REFERRING PHYSICIAN:  Titus Dubin. Alwyn Ren, M.D.   This is [name] 76 year old, Caucasian, American male, [name] naturalized [place],  [place].  He is retired from [employer].  The patient is admitted  for abdominal pain.  I have treated the patient in the past and he is happy  to see me today.   As long as five years ago, he was known to have gallbladder sludge.  [place] and visited the emergency room.  Gallbladder sludge was again noted.  He was discharged.  He has done well,  except for the last three weeks when his diet has changed.  His appetite is  decreased and he has lost 5 pounds just simply to not eating [name] much food.  For the last five days, he has had intermittent right upper quadrant pain  with radiation to the right back.  Gallbladder ultrasound and CT scan were  done showing multiple stones of the gallbladder.  Laboratory work drawn in  Dr. Frederik Pear office revealed total bilirubin of 2.0, otherwise his numbers  were normal.  He has been evaluated by Dr. Alwyn Ren with [name] cardiogram and  enzymes, which were normal.   The patient was admitted yesterday because of this pain and sensation of  chills and shaking.  He feels fine today after IV fluids.  He tolerated [name]  clear liquid breakfast today without difficulty.  He has had no change in  lower bowel function.  He has [name] negative genitourinary history.  His cardiac  history is negative, except for open heart surgery in 2001.  He has [name]  history of cervical spine surgery with bone graft from the left iliac  crest,  remote and stable.  His medications are noted.   Today during the interview and exam, he is afebrile, alert, cheerful, and  feels well.  His chest is clear.  His heart sounds are normal.  There is no  evidence of fever or jaundice at this time.  The abdomen is soft and  relaxed.  There is some fullness in the right upper quadrant with mild pain  with percussion.  [name] left groin wound is present.  His abdomen and groin  areas are negative.  Genitalia negative.   After review of his history, his clinical chart, and his x-rays, we have  decided that certainly his clinical syndrome is that of acute and chronic  cholecystolithiasis.  We have carefully  discussed gallbladder surgery, its  potential problems, and its expected outcomes and he wishes to proceed with  surgery at this time during this hospitalization.  I quite agree and we will  plan accordingly.                                               Timothy E. Earlene Plater, M.D.    TED/MEDQ  D:  02/14/2003  T:  02/14/2003  Job:  [id number]

## 2010-11-01 NOTE — H&P (Signed)
NAME:  [name], [name]                        ACCOUNT NO.:  [id number]   MEDICAL RECORD NO.:  [id number]                   PATIENT TYPE:  INP   LOCATION:  0376                                 FACILITY:  [facility]   PHYSICIAN:  Titus Dubin. Alwyn Ren, M.D. [facility]         DATE OF BIRTH:  1934/11/05   DATE OF ADMISSION:  02/13/2003  DATE OF DISCHARGE:                                HISTORY & PHYSICAL   HISTORY OF PRESENT ILLNESS:  The patient is a 77 year old [place]  naturalized [place] citizen who is admitted with rigor and abdominal  pain.  He was originally seen February 07, 2003, having been sick since February 03, 2003.  He described increased flatulence with burping and profound  weakness.  He also had intermittent chest pain.  The symptoms began as  malaise with weakness in the legs with alternating chills.  He also had  anorexia.  Blood pressure was noted to be somewhat variable with a low  systolic of 110, with a high systolic of 143.  He also noted epigastric pain  to the back which was better with Pepto-Bismol.  He did not have melena or  rectal bleeding.  He had no scleral icterus or jaundice.  He was slightly  tender in the epigastrium.  Bowel sounds were decreased, but the abdomen was  soft.   An EKG revealed nonspecific ST-T wave changes with poor R-waves in V1  through V2.  His lab studies revealed a white count of 8700, with a  hematocrit of 46.7.  Total bilirubin was 2, with otherwise normal liver  function.  Lipase was 32, amylase was minimally elevated at 93.  Ultrasound  of the gallbladder revealed multiple mobile gallstones within the  gallbladder with a slightly thickened wall.  An incidental finding was  hemangioma in the posterior aspect of the right lobe of the liver.  Significantly, an ultrasound on Nov 11, 2001 had revealed sludge and  possible nonchatting gallstones.  He was notified of the results and asked  to remain on a low fat diet.   He returned  acutely the morning of February 13, 2003, with acute onset in the  previous one to 1-1/2 hours of dizziness with chills and sensation of  dyspnea.  He denied any abdominal or chest pain.  He had intermittent rigor  with chills.  He also noted stiffness in his neck and weakness in his legs.  He had a bowel movement which was normal.  He had taken a Levsin XL which  had been prescribed on February 07, 2003, but which was with no benefit.   He appeared acutely ill and weak.  Abdomen was slightly tender in the  midline.  Skin was cool, but dry.  EKG revealed no significant change with  nonspecific ST-T wave changes.  He was admitted for evaluation of possible  cholecystitis.   PAST MEDICAL HISTORY:  1. Bypass grafting in February 2003, of  two vessels.  2. He has had C-spine surgery.  3. Hemorrhoidectomy.  4. Tonsillectomy.   MEDICAL PROBLEMS:  Hyperlipidemia.   FAMILY HISTORY:  Positive for atherosclerosis in a grandmother, mother, and  sister.  Grandmother also had peripheral vascular disease.  He quit smoking  in 1980.  He drinks socially.   ALLERGIES:  CODEINE.   MEDICATIONS:  1. Multivitamins.  2. Glucosamine sulfate.  3. An 81 mg aspirin.  4. Pravachol 40 mg at bedtime.  5. Zetia 10 mg daily.   PHYSICAL EXAMINATION:  GENERAL:  As stated he appeared ill and  uncomfortable.  VITAL SIGNS:  Temperature was 97.5, pulse 64, respiratory rate 20, and blood  pressure 130/90.  HEENT:  Arterial artery narrowing is present.  There is xanthelasma over the  sclerae, particularly on the right.  Arcus senilis is present.  His tongue  is coated with thick white oropharyngeal secretions.  The remainder of  otolaryngologic exam is unremarkable.  NECK:  Supple, there was no sign of meningismus on exam of the neck or lower  extremities.  CHEST:  Clear to auscultation.  He exhibited an S4 with slurring.  He had no  pulse deficit;  no edema was present.  ABDOMEN:  Slightly tender in the midline  over the aorta.  No aortic aneurysm  was palpable.  There was dullness to percussion of the right upper quadrant.  He appeared weak and had to be helped onto the exam table.  SKIN:  As noted, skin was cool, particularly the tip of his nose.  He had no  lymphadenopathy or definite organomegaly.   DISPOSITION:  He was admitted for evaluation of abdominal pain and rigor  with working diagnosis of possible cholecystitis.                                               Titus Dubin. Alwyn Ren, M.D. [facility]    WFH/MEDQ  D:  02/13/2003  T:  02/13/2003  Job:  [id number]

## 2010-11-01 NOTE — Op Note (Signed)
NAME:  [name], [name]                        ACCOUNT NO.:  [id number]   MEDICAL RECORD NO.:  [id number]                   PATIENT TYPE:  INP   LOCATION:  0376                                 FACILITY:  [facility]   PHYSICIAN:  Timothy E. Earlene Plater, M.D.              DATE OF BIRTH:  23-Jun-1934   DATE OF PROCEDURE:  02/14/2003  DATE OF DISCHARGE:                                 OPERATIVE REPORT   PREOPERATIVE DIAGNOSIS:  Cholecystolithiasis.   POSTOPERATIVE DIAGNOSIS:  Cholecystolithiasis.   OPERATION PERFORMED:  Laparoscopic cholecystectomy with intraoperative  cholangiogram.   SURGEON:  Timothy E. Earlene Plater, M.D.   ASSISTANT:  Lebron Conners, M.D.   ANESTHESIA:  CRNA supervised M.D.   INDICATIONS FOR PROCEDURE:  [name] was identified and the permit  signed, evaluated by anesthesia.   DESCRIPTION OF PROCEDURE:  [name] was taken to the operating room, placed  supine, general endotracheal anesthesia administered. The abdomen was  prepped and draped in the usual fashion. [name] 0.25% Marcaine with epinephrine  was used prior to each incision. [name] vertical infraumbilical incision made,  the fascia identified, opened vertically, the peritoneum entered without  complication. #1 Vicryl passed across the incision and the Hasson catheter  placed through the suture, the suture tied, the abdomen insufflated, general  peritoneoscopy was unremarkable. [name] second 10 mm trocar placed in the mid  epigastrium, two 5 mm trocars in the right upper quadrant. The gallbladder  was flacid, there were omental adhesions and there were multiple small  stones. The gallbladder was grasped and placed on tension, the adhesions  taken down, the gallbladder visualized completely. At the base of the  gallbladder, [name] normal appearing cystic duct was identified and dissected  free with [name] complete posterior window. The artery lay just behind the cystic  duct. Each was dissected out completely. Three clips were placed on the  artery, one clip placed on the duct as the cystic duct entered the  gallbladder. [name] small incision made in the cystic duct and then the Reddick  catheter passed percutaneously into the cystic duct. There was sludge, this  was washed away. The catheter was clipped in place and then using real-time  fluoroscopy and half strength dye, [name] cholangiogram was made. There was rapid  filling of the biliary tree and free flow of dye into the duodenum. The  distal common bile duct appeared somewhat dilated and the pancreatic duct  partially filled. There were no foreign bodies or evidence of stone. The  catheter was removed and the remnant of the cystic duct triply clipped. It  was fully divided, the artery was divided and the gallbladder was dissected  from the gallbladder bed without incident or complication. The gallbladder  bed was inspected and irrigated, cauterized where needed. The bed was dry  and the irrigant was clear. The gallbladder was removed through the  infraumbilical incision and that incision was closed with the existing  Vicryl suture. Irrigation carried our further. With counts correct, the  procedure complete, all irrigation, CO2,  instruments and trocars removed under direct vision. Each skin incision was  carefully inspected, cautery was used where necessary and the skin incisions  closed with 3-0 Monocryl. Steri-Strips applied, final counts correct, [name]  tolerated it well and [name] was moved to the recovery room in good condition.                                               Timothy E. Earlene Plater, M.D.    TED/MEDQ  D:  02/14/2003  T:  02/14/2003  Job:  [id number]

## 2010-11-01 NOTE — Op Note (Signed)
[place]  Patient:    [name], [name] Visit Number: [id number] MRN: [id number]          Service Type: MED Location: 2300 2303 [id number] Attending Physician:  Tressie Stalker Dictated by:   Salvatore Decent. Cornelius Moras, M.D. Proc. Date: 07/23/01 Admit Date:  07/22/2001   CC:         Luis Abed, M.D. [facility]  Daisey Must, M.D. [facility]  Titus Dubin. Alwyn Ren, M.D. [facility]   Operative Report  PREOPERATIVE DIAGNOSIS:   Severe single-vessel coronary artery disease with Class III unstable angina.  POSTOPERATIVE DIAGNOSIS:  Severe single-vessel coronary artery disease with Class III unstable angina.  PROCEDURE:  Off-pump coronary artery bypass grafting x 2 (left internal mammary artery to the distal left anterior descending coronary artery, saphenous vein graft to second diagonal branch).  SURGEON:  Salvatore Decent. Cornelius Moras, M.D.  ASSISTANT:  Adair Patter, P.A.  ANESTHESIA:  General.  BRIEF CLINICAL NOTE:  The patient is a 76 year old gentleman from [place] followed by Dr. Marga Melnick and Dr. Willa Rough and referred by Dr. Loraine Leriche Pulsipher for management of coronary artery disease.  The patient presents with a three to four week history of new onset symptoms of progressive exertional angina.  His symptoms have progressed considerably prompting evaluation by Dr. Alwyn Ren and Dr. Myrtis Ser.  The patient underwent elective cardiac catheterization by Dr. Gerri Spore on July 22, 2001.  This demonstrates high-grade 95% stenosis of the proximal left anterior descending coronary artery with disease involving take-off of the first and second diagonal branches respectively.  Because of the anatomy, the patients disease was felt to be relatively unfavorable for percutaneous-based coronary intervention.  The patient was referred for possible surgical revascularization.  OPERATIVE CONSENT:  The patient and his family have been counseled at length regarding the indications and  potential benefits of coronary artery bypass grafting.  They understand the associated risks of surgery including, but not limited to, risk of death, stroke, myocardial infarction, bleeding requiring blood transfusion, arrhythmia, infection, and recurrent coronary artery disease.  All their questions have been addressed.  OPERATIVE NOTE:  The patient is brought to the operating room on the above-mentioned date and invasive hemodynamic monitoring is established by the anesthesia service under the care and direction of Dr. Sheldon Silvan. Specifically, Swan-Ganz catheter was placed through the right internal jugular approach.  A right radial arterial line is placed.  Intravenous antibiotics are administered.  The patient was placed in the supine position on the operating table.  Following induction with general endotracheal anesthesia, the patients chest, abdomen, both groins, and both lower extremities are prepared and draped in a sterile manner.  A median sternotomy incision is performed and the left internal mammary artery dissected from the chest wall and prepared for bypass grafting.  The left internal mammary artery is notably good quality conduit.  Simultaneously, saphenous vein is obtained from the patients right lower leg through a longitudinal incision.  The saphenous vein is notably good quality conduit. The patient is heparinized systemically.  The pericardium is opened.  The ascending aorta is normal in appearance.  The heart is normal in appearance.  The epicardial coronary arteries appear to be good quality and favorable for off-pump coronary artery bypass grafting.  The first diagonal branch off the left anterior descending coronary artery is very small and felt not to be appropriate for grafting due to its very small size.  The patient is placed in Trendelenburg position with the table rotated toward the surgeons  side to facilitate exposure of the anterior and  anterolateral wall of the left ventricle.  Several deep pericardial sutures are placed just above the reflection of the pericardium overlying the left inferior pulmonary vein to facilitate exposure.  Saphenous vein and the left internal mammary artery are both trimmed to appropriate lengths.  A stabilization device is utilized to facilitate off-pump grafting ([employer]).  The following distal coronary anastomoses are performed:  1. The second diagonal branch off the left anterior descending coronary artery is grafted with a saphenous vein graft in an end-to-side fashion.  This coronary measures 1.5 mm in diameter and is of good quality.  Excellent visualization is achieved using the standard elastic tapes with the [employer] stabilization device.  After completion of the anastomosis, a 1.5 mm probe will pass in both directions easily.  2. The distal left anterior descending coronary artery is grafted with a left internal mammary artery in an end-to-side fashion.  This coronary measures 2.0 mm in diameter and is of good quality.  This distal anastomosis is constructed using an intracoronary shunt to facilitate hemostasis as elastic tapes do not provide hemostasis, presumably related to a large septal perforating branch.  A 2.0 mm shunt slides easily into the vessel and facilitates the distal anastomosis uneventfully.  After completion of the anastomosis, there is excellent forward flow documented through the left internal mammary artery graft using inoperative sterile Doppler probe.  The deep pericardial sutures are removed and the table is flattened.  A small partial occluding clamp is gently placed to the ascending aorta and the single proximal anastomosis of the single saphenous vein graft is performed in the end-to-side fashion, directly to the ascending aorta.  After completion of the  proximal anastomosis, protamine is administered to reverse the systemic heparinization.  The  patient tolerated the procedure very well with no significant hemodynamic instability whatsoever.  The patient remained in normal sinus rhythm throughout.  The mediastinum and the left chest are irrigated with saline solution containing vancomycin.  Meticulous surgical hemostasis is ascertained.  The mediastinum and the left chest are drained with two chest tubes placed through separate stab incisions inferiorly.  The median sternotomy is closed in the routine fashion.  The right lower extremity incision is closed in multiple layers in routine fashion.  All skin incisions are closed with subcuticular skin closures.  The patient tolerated the procedure well and is transported to the surgical intensive care unit in stable condition.  There are no intraoperative complications.  All sponge, instrument and needle counts are verified correct at the completion of the operation.  No blood products were administered. Dictated by:   Salvatore Decent Cornelius Moras, M.D. Attending Physician:  Tressie Stalker DD:  07/23/01 TD:  07/24/01 Job: [id number]

## 2010-11-01 NOTE — Discharge Summary (Signed)
   NAME:  [name], [name]                        ACCOUNT NO.:  [id number]   MEDICAL RECORD NO.:  [id number]                   PATIENT TYPE:  INP   LOCATION:  0376                                 FACILITY:  [facility]   PHYSICIAN:  Timothy E. Earlene Plater, M.D.              DATE OF BIRTH:  05-17-1935   DATE OF ADMISSION:  02/13/2003  DATE OF DISCHARGE:  02/15/2003                                 DISCHARGE SUMMARY   FINAL DIAGNOSIS:  Cholecystolithiasis.   OPERATIVE PROCEDURE:  On February 14, 2003 laparoscopic cholecystectomy and  operative cholangiogram. Final path report showing chronic  cholecystolithiasis.   HOSPITAL COURSE:  The patient was seen and admitted by his primary care  doctor, Titus Dubin. Alwyn Ren, M.D., please see that dictated note. His working  diagnosis was evaluation for abdominal pain. The patient was seen, admitted,  laboratory data were noted, CPK troponin were done and gallbladder stones  with ductal dilatation were seen on ultrasound. I was asked to talk to the  patient and saw the patient on the 31st and operated on August 31 with  cholecystectomy and cholangiogram which was normal. The patient actually had  [name] smooth recovery without complications. He had some dizziness and unusual  discomfort and was held later on September 1 because of that but later  discharged by Dr. Alwyn Ren on the same day. His symptoms resolved.   His admitting CBC was normal with elevated neutrophils. PT and PTT were  normal. Chemistry profile was normal with slight elevation of calcium at  10.6 and glucose 127. Total bilirubin 2.4. CKs were elevated, _________  levels were normal. Urinalysis was okay. Cardiogram showed normal except for  evidence of septal infarct, age undetermined. His followup will be routine  in the office. Dietary and activity measures recommended. He was voiding  satisfactory on discharge.                                               Timothy E. Earlene Plater, M.D.    TED/MEDQ  D:   03/01/2003  T:  03/01/2003  Job:  [id number]   cc:   Titus Dubin. Alwyn Ren, M.D. [facility]

## 2010-11-01 NOTE — Cardiovascular Report (Signed)
[place]  Patient:    [name], [name] Visit Number: [id number] MRN: [id number]          Service Type: CAT Location: 2300 2303 01 Attending Physician:  Tressie Stalker Dictated by:   Daisey Must, M.D. [facility] Proc. Date: 07/22/01 Admit Date:  07/22/2001   CC:         Titus Dubin. Alwyn Ren, M.D. [facility]  Luis Abed, M.D. [facility]  Cardiac Catheterization Lab   Cardiac Catheterization  PROCEDURE:  Left heart catheterization with coronary angiography and left ventriculography.  CARDIOLOGIST:  Daisey Must, M.D. [facility]  INDICATION:  [name] is a 76 year old male with recent onset of exertional angina.  An exercise treadmill test done in the office was positive for ST segment depression.  He was referred for cardiac catheterization.  PROCEDURAL NOTE:  A 6-French sheath was placed in the right femoral artery. Standard Judkins 6-French catheters were utilized.  Contrast was Omnipaque. There were no complications.  RESULTS:  HEMODYNAMICS:  Left ventricular pressure 134/18, aortic pressure 134/80. There was no aortic valve gradient.  LEFT VENTRICULOGRAM:  Wall motion is normal.  Ejection fraction calculated at 64%.  There is trace mitral regurgitation.  Left subclavian artery and internal mammary artery were patent and free of disease.  CORONARY ANGIOGRAPHY: (codominant).  Left main is normal.  Left anterior descending artery has a complex 95% stenosis in the proximal LAD across the origin of the first and second diagonal branch.  Just beyond this, a 95% lesion in the early portion of the mid LAD, there is 70% stenosis .  The LAD gives rise to a small first diagonal branch arising from the segmental vessel which has a 70% stenosis at its origin.  There is a large second diagonal branch which also arises from that diseased segment of the proximal LAD which has a 90% stenosis at its origin.  The left circumflex is a codominant vessel.   It gives rise to a normal size OM-1 which has a 20% stenosis proximally.  The circumflex also gives rise to small second and third marginal branches, a small first posterolateral branch, and a large second posterolateral branch.  The right coronary artery is a relatively small codominant vessel ending as a normal size posterior descending artery.  The right coronary artery has a 20% stenosis proximally.  IMPRESSIONS: 1. Normal left ventricular systolic function. 2. Complex bifurcational proximal left anterior descending artery disease    involving the first and second diagonal branches as described.  PLAN:  Options include complex percutaneous coronary intervention versus coronary artery bypass surgery.  The findings and options were discussed at length with the patient who would like time to consider his decision and discuss options with his family. Dictated by:   Daisey Must, M.D. [facility] Attending Physician:  Tressie Stalker DD:  07/22/01 TD:  07/23/01 Job: [id number]

## 2010-12-30 ENCOUNTER — Telehealth: Payer: Self-pay | Admitting: Cardiology

## 2010-12-30 NOTE — Telephone Encounter (Signed)
Did Dr. Antoine Poche approve medication changed.

## 2010-12-31 NOTE — Telephone Encounter (Signed)
Pt calling back re plavix , needs refill and wants to know if can change to generic clopidogrel, called yesterday and didn't hear back and needs to get refill

## 2010-12-31 NOTE — Telephone Encounter (Signed)
Pt had communication from [facility] stating that if he can change Plavix to generic it will save him a lot of money.  Pt wanted to know if this was OK which it is.  Pt will let [facility] know and they will contact us once the pt is ready for a refill.

## 2011-01-06 ENCOUNTER — Other Ambulatory Visit: Payer: Self-pay | Admitting: Internal Medicine

## 2011-01-07 MED ORDER — ATORVASTATIN CALCIUM 40 MG PO TABS: ORAL_TABLET | ORAL | Status: DC

## 2011-01-07 NOTE — Telephone Encounter (Signed)
Rx sent to pharmacy, patient with pending appointment 01/2011

## 2011-01-14 NOTE — Telephone Encounter (Signed)
Patient came in office to report that he only received 45 pills for this refill from [facility] instead of 90 pills---says he paid $20.00 for this refill for 45 and doesn't think he should have to pay another $20.00 to get the other 45 pills to equal 90       EMR shows two directions---"take one pill" and "take 1/2 pill"    ????

## 2011-01-15 NOTE — Telephone Encounter (Addendum)
Dr.Hopper please advise, patient is requesting his instructions be changed for cost savings reason, patient states this does not have to be changed now due to him already receiving #45 pills, patient would like this changed for future purposes    (Patient spoke with Pharmacist, community) to further address his concern

## 2011-02-03 ENCOUNTER — Emergency Department (HOSPITAL_COMMUNITY): Payer: Medicare Other

## 2011-02-03 ENCOUNTER — Emergency Department (HOSPITAL_COMMUNITY)
Admission: EM | Admit: 2011-02-03 | Discharge: 2011-02-03 | Disposition: A | Discharge status: Home / Self Care | Payer: Medicare Other | Attending: Emergency Medicine | Admitting: Emergency Medicine

## 2011-02-03 DIAGNOSIS — K219 Gastro-esophageal reflux disease without esophagitis: Secondary | ICD-10-CM | POA: Insufficient documentation

## 2011-02-03 DIAGNOSIS — Z79899 Other long term (current) drug therapy: Secondary | ICD-10-CM | POA: Insufficient documentation

## 2011-02-03 DIAGNOSIS — E7800 Pure hypercholesterolemia, unspecified: Secondary | ICD-10-CM | POA: Insufficient documentation

## 2011-02-03 DIAGNOSIS — I10 Essential (primary) hypertension: Secondary | ICD-10-CM | POA: Insufficient documentation

## 2011-02-03 DIAGNOSIS — J343 Hypertrophy of nasal turbinates: Secondary | ICD-10-CM | POA: Insufficient documentation

## 2011-02-03 DIAGNOSIS — H81399 Other peripheral vertigo, unspecified ear: Secondary | ICD-10-CM | POA: Insufficient documentation

## 2011-02-03 DIAGNOSIS — H9209 Otalgia, unspecified ear: Secondary | ICD-10-CM | POA: Insufficient documentation

## 2011-02-03 DIAGNOSIS — H5500 Unspecified nystagmus: Secondary | ICD-10-CM | POA: Insufficient documentation

## 2011-02-03 DIAGNOSIS — J3489 Other specified disorders of nose and nasal sinuses: Secondary | ICD-10-CM | POA: Insufficient documentation

## 2011-02-03 DIAGNOSIS — R110 Nausea: Secondary | ICD-10-CM | POA: Insufficient documentation

## 2011-02-03 DIAGNOSIS — I2510 Atherosclerotic heart disease of native coronary artery without angina pectoris: Secondary | ICD-10-CM | POA: Insufficient documentation

## 2011-02-03 LAB — DIFFERENTIAL
Basophils Absolute: 0 10*3/uL (ref 0.0–0.1)
Basophils Relative: 0 % (ref 0–1)
Eosinophils Relative: 1 % (ref 0–5)
Monocytes Absolute: 0.5 10*3/uL (ref 0.1–1.0)

## 2011-02-03 LAB — POCT I-STAT TROPONIN I: Troponin i, poc: 0 ng/mL (ref 0.00–0.08)

## 2011-02-03 LAB — CBC
Hemoglobin: 15.4 g/dL (ref 13.0–17.0)
Platelets: 201 10*3/uL (ref 150–400)
RBC: 4.91 MIL/uL (ref 4.22–5.81)
WBC: 11.1 10*3/uL — ABNORMAL HIGH (ref 4.0–10.5)

## 2011-02-03 LAB — BASIC METABOLIC PANEL
CO2: 25 mEq/L (ref 19–32)
Chloride: 100 mEq/L (ref 96–112)
Glucose, Bld: 134 mg/dL — ABNORMAL HIGH (ref 70–99)
Potassium: 3.6 mEq/L (ref 3.5–5.1)
Sodium: 135 mEq/L (ref 135–145)

## 2011-02-06 ENCOUNTER — Telehealth: Payer: Self-pay | Admitting: Internal Medicine

## 2011-02-06 NOTE — Telephone Encounter (Signed)
Pt called to further explain that he went to pharmacy to get prescription filled for Ondansetron after he left ER--he had to purchase the pills because pharmacist said he needed prior approval, so he just paid for them---since prescription was given in ER, they wont do prior approvals---can his PCP office give a prior approval for this medication?? Or what option does patient have??

## 2011-02-12 ENCOUNTER — Other Ambulatory Visit: Payer: Self-pay | Admitting: Internal Medicine

## 2011-02-12 ENCOUNTER — Encounter: Payer: Self-pay | Admitting: Internal Medicine

## 2011-02-12 ENCOUNTER — Ambulatory Visit (INDEPENDENT_AMBULATORY_CARE_PROVIDER_SITE_OTHER): Payer: Medicare Other | Admitting: Internal Medicine

## 2011-02-12 DIAGNOSIS — I2510 Atherosclerotic heart disease of native coronary artery without angina pectoris: Secondary | ICD-10-CM

## 2011-02-12 DIAGNOSIS — E785 Hyperlipidemia, unspecified: Secondary | ICD-10-CM

## 2011-02-12 DIAGNOSIS — M899 Disorder of bone, unspecified: Secondary | ICD-10-CM

## 2011-02-12 DIAGNOSIS — T887XXA Unspecified adverse effect of drug or medicament, initial encounter: Secondary | ICD-10-CM

## 2011-02-12 DIAGNOSIS — I10 Essential (primary) hypertension: Secondary | ICD-10-CM

## 2011-02-12 DIAGNOSIS — M949 Disorder of cartilage, unspecified: Secondary | ICD-10-CM

## 2011-02-12 DIAGNOSIS — R7309 Other abnormal glucose: Secondary | ICD-10-CM

## 2011-02-12 DIAGNOSIS — E782 Mixed hyperlipidemia: Secondary | ICD-10-CM

## 2011-02-12 NOTE — Telephone Encounter (Signed)
Discussed at office visit.

## 2011-02-12 NOTE — Progress Notes (Signed)
Subjective:    Patient ID: [name], male    DOB: 06-04-1935, 76 y.o.   MRN: [id number]  HPI Medicare Wellness Visit:  The following psychosocial & medical history were reviewed as required by Medicare.   Social history: caffeine: occasional tea , alcohol:  no ,  tobacco use : quit 1980  & exercise : no due to back.   Home & personal  safety / fall risk: no issues, activities of daily living: no limitations , seatbelt use : yes , and smoke alarm employment : yes .  Power of Attorney/Living Will status : ? yes  Vision ( as recorded per Nurse) & Hearing  evaluation :  Wall chart read & whisper heard @ 6 ft. Orientation :oriented X 3 , memory & recall :good, spelling &  math testing: good,and mood & affect : normal . Depression / anxiety: occasionally; medsdeclined Travel history : [place] 2011 , immunization status :Shingles needed , transfusion history:  no, and preventive health surveillance ( colonoscopies, BMD , etc as per protocol/ [facility]): colonoscopy due 2017, Dental care:  Implants & dentures . Chart reviewed &  Updated. Active issues reviewed & addressed.       Review of Systems BACK  PAIN: Location: low LS area bilaterally, R > L   Onset: > 12 mos   Severity: up to 9 Pain is described as: sharp  Worse with: standing for prolonged periods    Better with: Acupuncture ("fantastic")  Pain radiates to: as far as R lateral calf   Impaired range of motion: yes History of repetitive motion:  no  History of overuse or hyperextension:  no  History of trauma:  no   Past history of similar problem:  yes, initially 2007 Symptoms Numbness/tingling:  no  Weakness:  yes, in both legs Red Flags Fever:  no  Bowel/bladder dysfunction:  no      Objective:   Physical Exam Gen.: Healthy and well-nourished in appearance. Alert, appropriate and cooperative throughout exam. Head: Normocephalic without obvious abnormalities Eyes: No corneal or conjunctival inflammation noted.Arcus  senilis. Pupils equal round reactive to light and accommodation. Fundal exam:arteriolar narrowing. Extraocular motion intact. Vision grossly normal with lenses. Ears: External  ear exam reveals no significant lesions or deformities. Canals clear .TMs normal. Hearing is grossly normal bilaterally. Nose: External nasal exam reveals no deformity or inflammation. Nasal mucosa are pink and moist. No lesions or exudates noted.  Mouth: Oral mucosa and oropharynx reveal no lesions or exudates. Upper plate , lower partial Neck: No deformities, masses, or tenderness noted. Range of motion &. Thyroid normal. Lungs: Normal respiratory effort; chest expands symmetrically. Lungs are clear to auscultation without rales, wheezes, or increased work of breathing. Heart: Normal rate and rhythm. Normal S1 and S2. No gallop, click, or rub. S4 w/o  murmur. Abdomen: Bowel sounds normal; abdomen soft and nontender. No masses, organomegaly or hernias noted. Genitalia/DRE: Exam is totally normal; no pathologic findings.   .                                                                                   Musculoskeletal/extremities: No deformity or scoliosis noted of  the thoracic  or lumbar spine. No clubbing, cyanosis, edema, or deformity noted. Range of motion  normal .Tone & strength  normal.Joints normal. Nail health  good. Vascular: Carotid, radial artery, dorsalis pedis and  posterior tibial pulses are full and equal. No bruits present. Neurologic: Alert and oriented x3. Deep tendon reflexes symmetrical and normal.          Skin: Intact without suspicious lesions or rashes. Lymph: No cervical, axillary, or inguinal lymphadenopathy present. Psych: Mood and affect are normal. Normally interactive                                                                                        Assessment & Plan:  #1 Medicare Wellness Exam; criteria met ; data entered #2 Problem List reviewed ; Assessment/ Recommendations  made  #3 lumbosacral pain with radiculopathy right lower extremity greater than the left. Spinal stenosis is suggested. Plan: see Orders    Acupuncture has been beneficial; if symptoms persist or progress, I recommend consultation with Dr. Jeral Fruit, Neurosurgeon.

## 2011-02-12 NOTE — Patient Instructions (Addendum)
Preventive Health Care: Exercise at least 30-45 minutes a day,  3-4 days a week.  Eat a low-fat diet with lots of fruits and vegetables, up to 7-9 servings per day. Consume less than 40 grams of sugar per day from foods & drinks with High Fructose Corn Sugar as #1,2,3 or # 4 on label. Depression is common in our stressful world.If you're feeling down or losing interest in things you normally enjoy, please call .  The best exercises for the low back include freestyle swimming, stretch aerobics, and yoga.  Please  schedule fasting Labs : Lipids, hepatic panel,TSH,A1c, vitamin D level (272.4, 995.20, 790.29,733.90)

## 2011-02-13 ENCOUNTER — Other Ambulatory Visit (INDEPENDENT_AMBULATORY_CARE_PROVIDER_SITE_OTHER): Payer: Medicare Other

## 2011-02-13 DIAGNOSIS — E785 Hyperlipidemia, unspecified: Secondary | ICD-10-CM

## 2011-02-13 DIAGNOSIS — M899 Disorder of bone, unspecified: Secondary | ICD-10-CM

## 2011-02-13 DIAGNOSIS — R7309 Other abnormal glucose: Secondary | ICD-10-CM

## 2011-02-13 DIAGNOSIS — M949 Disorder of cartilage, unspecified: Secondary | ICD-10-CM

## 2011-02-13 DIAGNOSIS — T887XXA Unspecified adverse effect of drug or medicament, initial encounter: Secondary | ICD-10-CM

## 2011-02-13 LAB — HEPATIC FUNCTION PANEL
Albumin: 4.6 g/dL (ref 3.5–5.2)
Alkaline Phosphatase: 68 U/L (ref 39–117)
Total Protein: 7.9 g/dL (ref 6.0–8.3)

## 2011-02-13 LAB — LIPID PANEL
Cholesterol: 154 mg/dL (ref 0–200)
HDL: 50.4 mg/dL (ref >39.00)
LDL Cholesterol: 74 mg/dL (ref 0–99)
Triglycerides: 148 mg/dL (ref 0.0–149.0)
VLDL: 29.6 mg/dL (ref 0.0–40.0)

## 2011-02-13 LAB — HEMOGLOBIN A1C: Hgb A1c MFr Bld: 5.7 % (ref 4.6–6.5)

## 2011-02-13 LAB — TSH: TSH: 1.33 u[IU]/mL (ref 0.35–5.50)

## 2011-02-13 NOTE — Patient Instructions (Signed)
Labs only

## 2011-02-14 LAB — VITAMIN D 25 HYDROXY (VIT D DEFICIENCY, FRACTURES): Vit D, 25-Hydroxy: 47 ng/mL (ref 30–89)

## 2011-03-15 ENCOUNTER — Other Ambulatory Visit: Payer: Self-pay | Admitting: Cardiology

## 2011-03-24 ENCOUNTER — Telehealth: Payer: Self-pay | Admitting: Cardiology

## 2011-03-24 DIAGNOSIS — I2510 Atherosclerotic heart disease of native coronary artery without angina pectoris: Secondary | ICD-10-CM

## 2011-03-24 MED ORDER — CLOPIDOGREL BISULFATE 75 MG PO TABS: 75.0000 mg | ORAL_TABLET | Freq: Every day | ORAL | Status: DC

## 2011-03-24 NOTE — Telephone Encounter (Signed)
Pt aware to continue and that RX was sent.

## 2011-03-24 NOTE — Telephone Encounter (Signed)
Pt has a HX of CABG and a DES.  Will review with Dr Antoine Poche and let pt know.

## 2011-03-24 NOTE — Telephone Encounter (Signed)
Pt calling wanting to know if MD wants pt to continue taking plavix  (genetic) 75 mg-90 day supply . If so pt needs a refill called into [facility]. Please return pt call to discuss further.

## 2011-03-24 NOTE — Telephone Encounter (Signed)
Needs Plavix to continue.

## 2011-03-28 LAB — CK TOTAL AND CKMB (NOT AT ARMC)
CK, MB: 20.3 — ABNORMAL HIGH
CK, MB: 6.1 — ABNORMAL HIGH
Relative Index: 5.4 — ABNORMAL HIGH
Total CK: 213

## 2011-03-28 LAB — BASIC METABOLIC PANEL
BUN: 10
BUN: 19
BUN: 12
CO2: 25
Chloride: 103
Chloride: 106
Creatinine, Ser: 0.72
GFR calc non Af Amer: >60
GFR calc non Af Amer: >60
Glucose, Bld: 107 — ABNORMAL HIGH
Potassium: 4.7
Potassium: 3.8
Sodium: 142

## 2011-03-28 LAB — POCT CARDIAC MARKERS
CKMB, poc: 12.1
Troponin i, poc: 0.25 — ABNORMAL HIGH
Troponin i, poc: 0.53

## 2011-03-28 LAB — HEPARIN LEVEL (UNFRACTIONATED)
Heparin Unfractionated: 0.64
Heparin Unfractionated: 0.67
Heparin Unfractionated: 0.69
Heparin Unfractionated: 0.77 — ABNORMAL HIGH

## 2011-03-28 LAB — PROTIME-INR: INR: 0.9

## 2011-03-28 LAB — DIFFERENTIAL
Eosinophils Absolute: 0.2
Eosinophils Relative: 2
Lymphs Abs: 0.8
Monocytes Relative: 5

## 2011-03-28 LAB — CBC
HCT: 42.5
HCT: 43.6
HCT: 44.2
HCT: 40.6
Hemoglobin: 14.9
Hemoglobin: 14.2
MCHC: 34.6
MCV: 88.7
MCV: 88.9
MCV: 89.3
Platelets: 217
Platelets: 228
Platelets: 257
RBC: 4.54
RDW: 12.7
WBC: 10
WBC: 9.3
WBC: 9.4

## 2011-03-28 LAB — CARDIAC PANEL(CRET KIN+CKTOT+MB+TROPI)
CK, MB: 8.1 — ABNORMAL HIGH
CK, MB: 9.7 — ABNORMAL HIGH
Relative Index: 6.4 — ABNORMAL HIGH
Total CK: 140
Troponin I: 0.61

## 2011-03-28 LAB — LIPID PANEL
HDL: 46
Total CHOL/HDL Ratio: 2.7

## 2011-03-28 LAB — D-DIMER, QUANTITATIVE: D-Dimer, Quant: 0.33

## 2011-04-10 ENCOUNTER — Other Ambulatory Visit: Payer: Self-pay | Admitting: Internal Medicine

## 2011-04-10 MED ORDER — ATORVASTATIN CALCIUM 20 MG PO TABS: 20.0000 mg | ORAL_TABLET | Freq: Every day | ORAL | Status: DC

## 2011-04-10 NOTE — Telephone Encounter (Signed)
RX sent to [facility]. 

## 2011-04-23 ENCOUNTER — Encounter: Payer: Self-pay | Admitting: Cardiology

## 2011-04-24 ENCOUNTER — Encounter: Payer: Self-pay | Admitting: Cardiology

## 2011-04-24 ENCOUNTER — Ambulatory Visit (INDEPENDENT_AMBULATORY_CARE_PROVIDER_SITE_OTHER): Payer: Medicare Other | Admitting: Cardiology

## 2011-04-24 DIAGNOSIS — I2510 Atherosclerotic heart disease of native coronary artery without angina pectoris: Secondary | ICD-10-CM

## 2011-04-24 DIAGNOSIS — I10 Essential (primary) hypertension: Secondary | ICD-10-CM

## 2011-04-24 DIAGNOSIS — E782 Mixed hyperlipidemia: Secondary | ICD-10-CM

## 2011-04-24 MED ORDER — METOPROLOL TARTRATE 25 MG PO TABS: 12.5000 mg | ORAL_TABLET | Freq: Two times a day (BID) | ORAL | Status: DC

## 2011-04-24 NOTE — Assessment & Plan Note (Addendum)
Lab Results  Component Value Date   CHOL 154 02/13/2011   HDL 50.40 02/13/2011   LDLCALC 74 02/13/2011   TRIG 148.0 02/13/2011   CHOLHDL 3 02/13/2011  His lipids are great.  However, he has muscle aches that could be the Lipitor.  He will stop this for one month to see if the aches go away.  If they do I will restart probably 80 pravastatin.

## 2011-04-24 NOTE — Progress Notes (Signed)
HPI The patient presents for one-year followup. Since I last saw him he had one ER visit for back pain. He has a hard time distinguishing this from previous angina. However, this was felt to be possibly GI related as he does have lots of "gas". The abdominal discomfort seems to be improved. However, he still occasionally gets the discomfort in his mid back which is similar to previous angina. He's also had spine problems. He gets some neck pain but he thinks this is positional. He says the discomfort happens more with activity. He does do some activities such as riding a bicycle and can't necessarily bring on the symptoms. He's not describing associated nausea vomiting or diaphoresis. He's not describing associated palpitations, presyncope or syncope. He has some mild shortness of breath but no PND or orthopnea. He's had no weight gain or edema. He does have some diffuse muscle aches.  Allergies  Allergen Reactions  . Codeine     REACTION: unspecified; ? nausea    Current Outpatient Prescriptions  Medication Sig Dispense Refill  . aspirin 81 MG tablet Take 81 mg by mouth daily.        [name] atorvastatin (LIPITOR) 20 MG tablet Take 1 tablet (20 mg total) by mouth daily.  90 tablet  2  . Calcium Carbonate (CALCIUM 500 PO) Take by mouth daily.        . clopidogrel (PLAVIX) 75 MG tablet Take 1 tablet (75 mg total) by mouth daily.  90 tablet  3  . Coenzyme Q10 200 MG capsule Take 200 mg by mouth daily.        . metoprolol tartrate (LOPRESSOR) 25 MG tablet TAKE 1/2 TABLET BY MOUTH TWICE A DAY  30 tablet  6  . Multiple Vitamin (MULTIVITAMIN PO) Take by mouth daily.        . nitroGLYCERIN (NITROSTAT) 0.4 MG SL tablet Place 0.4 mg under the tongue every 5 (five) minutes as needed.        . ranitidine (ZANTAC) 150 MG capsule Take 150 mg by mouth 2 (two) times daily.        [name] VITAMIN D, CHOLECALCIFEROL, PO Take by mouth daily.          Past Medical History  Diagnosis Date  . Hyperlipidemia   .  Hypertension   . Diverticulosis   . Gilbert's syndrome   . Osteopenia 2010    t-score -1.9 @ hip (femoral neck)  . CAD (coronary artery disease)     Past Surgical History  Procedure Date  . Hemorrhoid surgery   . Cholecystectomy 2004  . Cervical fusion 1997    Dr Jeral Fruit  . Coronary artery bypass graft 2003    Last catheterization was in August 2008 demonstrated a LIMA to th LAD which was patent, there was an atretic saphenous vein graft to the diagonal, the LAD had a 90% stenosis in the large calcified segment, the diagonal has ostial 70% stenosis, the circumflex had a ramus intermediate with ostial 25% stenosis, the right coronary artery was dominant.  There was an ulcerated 80%-90% stenosis.    . Tonsillectomy and adenoidectomy   . Coronary stent placement     drug-eluting stent to right coronary artery    ROS:  Muscle aches.  Otherwise as stated in the HPI and negative for all other systems.   PHYSICAL EXAM BP 110/67  Pulse 75  Resp 16  Ht 5\' 4"  (1.626 m)  Wt 165 lb (74.844 kg)  BMI 28.32 kg/m2 GENERAL:  Well appearing HEENT:  Pupils equal round and reactive, fundi not visualized, oral mucosa unremarkable NECK:  No jugular venous distention, waveform within normal limits, carotid upstroke brisk and symmetric, no bruits, no thyromegaly LYMPHATICS:  No cervical, inguinal adenopathy LUNGS:  Clear to auscultation bilaterally BACK:  No CVA tenderness CHEST:  Well healed sternotomy scar. HEART:  PMI not displaced or sustained,S1 and S2 within normal limits, no S3, no S4, no clicks, no rubs, no murmurs ABD:  Flat, positive bowel sounds normal in frequency in pitch, no bruits, no rebound, no guarding, no midline pulsatile mass, no hepatomegaly, no splenomegaly EXT:  2 plus pulses throughout, no edema, no cyanosis no clubbing SKIN:  No rashes no nodules NEURO:  Cranial nerves II through XII grossly intact, motor grossly intact throughout [facility]:  Cognitively intact, oriented to  person place and time   EKG:  02/03/11  sinus rhythm, poor anterior R wave progression, no acute ST-T wave changes, axis and intervals within normal limits.  ASSESSMENT AND PLAN

## 2011-04-24 NOTE — Assessment & Plan Note (Signed)
He has back pain that could be angina.  I will check a stress Myoview.  He can walk the treadmill.

## 2011-04-24 NOTE — Assessment & Plan Note (Signed)
The blood pressure is at target. No change in medications is indicated. We will continue with therapeutic lifestyle changes (TLC).  

## 2011-04-24 NOTE — Patient Instructions (Signed)
Your physician has requested that you have a lexiscan myoview. For further information please visit [email or web address] Please follow instruction sheet, as given.  Hold Lipitor for 1 month to see if leg pain improves.  Continue all other medications as listed  Follow up in 1 year with Dr Antoine Poche.  You will receive a letter in the mail 2 months before you are due.  Please call us when you receive this letter to schedule your follow up appointment.

## 2011-04-30 ENCOUNTER — Ambulatory Visit (HOSPITAL_COMMUNITY): Payer: Medicare Other | Attending: Cardiology | Admitting: Radiology

## 2011-04-30 VITALS — Ht 64.0 in | Wt 164.0 lb

## 2011-04-30 DIAGNOSIS — R0609 Other forms of dyspnea: Secondary | ICD-10-CM | POA: Insufficient documentation

## 2011-04-30 DIAGNOSIS — Z951 Presence of aortocoronary bypass graft: Secondary | ICD-10-CM | POA: Insufficient documentation

## 2011-04-30 DIAGNOSIS — R42 Dizziness and giddiness: Secondary | ICD-10-CM | POA: Insufficient documentation

## 2011-04-30 DIAGNOSIS — R5383 Other fatigue: Secondary | ICD-10-CM | POA: Insufficient documentation

## 2011-04-30 DIAGNOSIS — R5381 Other malaise: Secondary | ICD-10-CM | POA: Insufficient documentation

## 2011-04-30 DIAGNOSIS — I25810 Atherosclerosis of coronary artery bypass graft(s) without angina pectoris: Secondary | ICD-10-CM

## 2011-04-30 DIAGNOSIS — M549 Dorsalgia, unspecified: Secondary | ICD-10-CM | POA: Insufficient documentation

## 2011-04-30 DIAGNOSIS — R0989 Other specified symptoms and signs involving the circulatory and respiratory systems: Secondary | ICD-10-CM | POA: Insufficient documentation

## 2011-04-30 DIAGNOSIS — I209 Angina pectoris, unspecified: Secondary | ICD-10-CM

## 2011-04-30 DIAGNOSIS — I2510 Atherosclerotic heart disease of native coronary artery without angina pectoris: Secondary | ICD-10-CM

## 2011-04-30 MED ORDER — TECHNETIUM TC 99M TETROFOSMIN IV KIT
33.0000 | PACK | Freq: Once | INTRAVENOUS | Status: AC | PRN
  Administered 2011-04-30: 33 via INTRAVENOUS

## 2011-04-30 MED ORDER — TECHNETIUM TC 99M TETROFOSMIN IV KIT
11.0000 | PACK | Freq: Once | INTRAVENOUS | Status: AC | PRN
  Administered 2011-04-30: 11 via INTRAVENOUS

## 2011-04-30 NOTE — Progress Notes (Signed)
[facility] SITE 3 NUCLEAR MED [address] [place] [address] [phone]  Cardiology Nuclear Med Study  [name] is a 76 y.o. male [id number] 13-Oct-1934   Nuclear Med Background Indication for Stress Test:  Evaluation for Ischemia, Graft/Stent Patency  History:  '03 CABG; '08 MI>Stent-RCA, EF=65%; '09 MPS:No ischemia or infarct, EF=65%. Cardiac Risk Factors: History of Smoking, Hypertension and Lipids  Symptoms:  Dizziness, DOE, Fatigue and Back pain (previous anginal equivalent)   Nuclear Pre-Procedure Caffeine/Decaff Intake:  None NPO After: 8:30pm   Lungs:  Clear. IV 0.9% NS with Angio Cath:  20g  IV Site: R Antecubital  IV Started by:  Bonnita Levan, RN  Chest Size (in):  46 Cup Size: n/a  Height: 5\' 4"  (1.626 m)  Weight:  164 lb (74.39 kg)  BMI:  Body mass index is 28.15 kg/(m^2). Tech Comments:  Patient held all meds this AM, Lopressor held x 17 hours    Nuclear Med Study 1 or 2 day study: 1 day  Stress Test Type:  Stress  Reading MD: Olga Millers, MD  Order Authorizing Provider:  Rollene Rotunda, MD  Resting Radionuclide: Technetium 56m Tetrofosmin  Resting Radionuclide Dose: 11.0 mCi   Stress Radionuclide:  Technetium 16m Tetrofosmin  Stress Radionuclide Dose: 33.0 mCi           Stress Protocol Rest HR: 67 Stress HR: 148  Rest BP: Sitting:121/70  Standing:131/80 Stress BP: 190/99  Exercise Time (min): 7:00 METS: 8.5   Predicted Max HR: 147 bpm % Max HR: 100.68 bpm Rate Pressure Product: 21308   Dose of Adenosine (mg):  n/a Dose of Lexiscan: n/a mg  Dose of Atropine (mg): n/a Dose of Dobutamine: n/a mcg/kg/min (at max HR)  Stress Test Technologist: Smiley Houseman, CMA-N  Nuclear Technologist:  Domenic Polite, CNMT     Rest Procedure:  Myocardial perfusion imaging was performed at rest 45 minutes following the intravenous administration of Technetium 80m Tetrofosmin.  Rest ECG: Prior SWMI.  Stress Procedure:  The patient  exercised for seven minutes on the treadmill utilizing the Bruce protocol.  The patient stopped due to fatigue.  He denied any chest pain, but did c/o back pain, 3-4/10, with exercise.  There were no diagnostic ST-T wave changes.  There were occasional PVC's with couplets and he had a 36 mmHg drop in his BP at peak exercise.  Technetium 51m Tetrofosmin was injected at peak exercise and myocardial perfusion imaging was performed after a brief delay.  Stress ECG: No significant ST segment change suggestive of ischemia.  QPS Raw Data Images:  Acquisition technically good; normal left ventricular size. Stress Images:  There is decreased uptake in the apex and inferobasal wall. Rest Images:  There is decreased uptake in the apex and inferobasal wall. Subtraction (SDS):  No evidence of ischemia. Transient Ischemic Dilatation (Normal <1.22):  0.93 Lung/Heart Ratio (Normal <0.45):  0.32  Quantitative Gated Spect Images QGS EDV:  76 ml QGS ESV:  29 ml QGS cine images:  NL LV Function; NL Wall Motion QGS EF: 62%  Impression Exercise Capacity:  Fair exercise capacity. BP Response:  Possible decrease in systolic BP from 657 to 154 with peak exercise; technologist unclear if BP accurate. Clinical Symptoms:  Patient complained of back pain with exercise. ECG Impression:  No significant ST segment change suggestive of ischemia. Comparison with Prior Nuclear Study: No images to compare  Overall Impression:  Low risk stress nuclear study with small fixed apical and inferobasal  defects suggestive of thinning; no ischemia.   Olga Millers

## 2011-05-26 ENCOUNTER — Telehealth: Payer: Self-pay | Admitting: Cardiology

## 2011-05-26 DIAGNOSIS — M791 Myalgia, unspecified site: Secondary | ICD-10-CM

## 2011-05-26 NOTE — Telephone Encounter (Signed)
New Msg: Pt calling stating that he was suppose to call in regards to pt results of stopping Lipitor. Pt calling to results of stopping Lipitor. Please return pt call to discuss further.

## 2011-05-26 NOTE — Telephone Encounter (Signed)
Called wanting to speak w/Pam. Wants her to call him on Tuesday. States he can't really tell if being off Lipitor has really helped with muscle aches. Wants to know what Dr. Antoine Poche would recommend.

## 2011-05-27 NOTE — Telephone Encounter (Signed)
Spoke with pt who states he can't tell if he is feeling better or not eing off of the Lipitor.  He thinks maybe a little.  He reports having pain in  His muscles over his joints.  He went to an accupuntunist who told him his muscle are tight.  I instructed pt to remain off of the Lipitor for now and do frequent stretching.  I will discuss with Dr Antoine Poche and call the pt back with recommendations

## 2011-05-29 NOTE — Telephone Encounter (Signed)
If his back pain and/or muscle aches are the same off of Lipitor for one month then it is less likely to be this medication. He can restart the Lipitor at the previous dose.  Please check a CK

## 2011-05-29 NOTE — Telephone Encounter (Signed)
Left message for pt to restart Lipitor at previous dose and to call to schedule blood work.

## 2011-06-02 ENCOUNTER — Ambulatory Visit (INDEPENDENT_AMBULATORY_CARE_PROVIDER_SITE_OTHER): Payer: Medicare Other | Admitting: *Deleted

## 2011-06-02 ENCOUNTER — Telehealth: Payer: Self-pay | Admitting: Cardiology

## 2011-06-02 DIAGNOSIS — E782 Mixed hyperlipidemia: Secondary | ICD-10-CM

## 2011-06-02 NOTE — Telephone Encounter (Signed)
Please see prior telephone note 

## 2011-06-02 NOTE — Telephone Encounter (Signed)
Pt aware of instructions and will come in today for lab

## 2011-06-02 NOTE — Telephone Encounter (Signed)
Pt calling re test dr hochrein wants him to do

## 2011-06-05 ENCOUNTER — Telehealth: Payer: Self-pay | Admitting: Cardiology

## 2011-06-05 NOTE — Telephone Encounter (Signed)
New Problem:    Patient returned a phone call that was placed to him.  Possibly in regards to his blood work that he had done on 06/02/11.

## 2011-06-05 NOTE — Telephone Encounter (Signed)
Pt notified of lab results

## 2011-08-13 ENCOUNTER — Encounter: Payer: Self-pay | Admitting: Internal Medicine

## 2011-08-13 ENCOUNTER — Ambulatory Visit (INDEPENDENT_AMBULATORY_CARE_PROVIDER_SITE_OTHER): Payer: Medicare Other | Admitting: Internal Medicine

## 2011-08-13 VITALS — BP 124/78 | HR 80 | Temp 98.4°F | Wt 166.4 lb

## 2011-08-13 DIAGNOSIS — R1013 Epigastric pain: Secondary | ICD-10-CM

## 2011-08-13 DIAGNOSIS — L299 Pruritus, unspecified: Secondary | ICD-10-CM

## 2011-08-13 DIAGNOSIS — R21 Rash and other nonspecific skin eruption: Secondary | ICD-10-CM

## 2011-08-13 NOTE — Patient Instructions (Addendum)
Take the ranitidine 150 mg before breakfast and eve meals over the next 4-6 weeks.The triggers for reflux   include stress; the "aspirin family" ; alcohol; peppermint;  caffeine (coffee, tea, cola, and chocolate) & possibly the fruit juices. The aspirin family would include aspirin and the nonsteroidal agents such as ibuprofen &  Naproxen. Tylenol would not cause reflux. If having symptoms ; food & drink should be avoided for @ least 2 hours before going to bed.  Apply Cort Aid OTC twice a day to the involved tissues. Do not get this topical steroid into eyes. Use hypoallergenic cleansing motions.

## 2011-08-13 NOTE — Progress Notes (Signed)
  Subjective:    Patient ID: [name], male    DOB: 06-30-1934, 77 y.o.   MRN: [id number]  HPI He noted itching across the midabdomen on both sides of the navel yesterday. Today in the same distribution he noted a very faint rash. He had a phone consultation with the nurse who suggested followup for possible shingles. He has had some malaise but denies fever, chills, sweats, unexplained weight loss, melena, or rectal bleeding. Since yesterday he states intermittent upper quadrant discomfort, worse with movement. Recently he's also had increased dyspepsia; he's had no dysphasia but has noted some reflux symptoms if he flexes at the waist after eating. He's been taking Zantac as needed but not on  a regular basis.  He has a history of shingles approximately 25 years ago.  He believes he had peptic ulcer approximately 1995. There is no family history of GI disease   He has been juicing in the past week using  tropical fruits    Review of Systems  He denies hematuria, pyuria, or dysuria. He's had some mid to lower back pain on L  related to thorax rotation. He denies ingestion of nonsteroidals     Objective:   Physical Exam  General appearance is one of good health and nourishment w/o distress.  Eyes: No conjunctival inflammation or scleral icterus is present. Arcus senilis  Oral exam: upper plate & lower partial; lips and gums are healthy appearing.There is no oropharyngeal erythema or exudate noted.   Heart:  Normal rate and regular rhythm. S1 and S2 normal without gallop, murmur, click, rub or other extra sounds     Lungs:Chest clear to auscultation; no wheezes, rhonchi,rales ,or rubs present.No increased work of breathing.   Abdomen: bowel sounds normal, soft but minimally tender in  L epigastric area without masses, organomegaly or hernias noted.  No guarding or rebound   Skin:Warm & dry.  Intact without suspicious lesions or rashes ; no jaundice or tenting. There may be a  very faint erythematous change of the midabdomen. There are  no classic zoster-like lesions  Musculoskeletal: No pain to percussion over the lower thoracic spine or with thoracic compression  Lymphatic: No lymphadenopathy is noted about the head, neck, axilla areas.              Assessment & Plan:  #1 faint abdominal rash; no evidence of herpes zoster. The rash may be related to the ingestion of  tropical fruit juices  #2 left upper quadrant/epigastric tenderness; this is most likely related to reflux/hiatal hernia  Plan: I would recommend that he take ranitidine 150 mg every 12 hours before breakfast and evening meal. I would stop the fruit juices. The ranitidine will have antihistamine effects for itching as well.

## 2011-08-14 ENCOUNTER — Telehealth: Payer: Self-pay | Admitting: Internal Medicine

## 2011-08-14 NOTE — Telephone Encounter (Signed)
hopp said refer Dr Jeral Fruit in his note

## 2011-08-14 NOTE — Telephone Encounter (Signed)
I spoke with patient, patient states he will hold off on referral for now. Patient will call back if needed

## 2011-08-14 NOTE — Telephone Encounter (Signed)
Patient called & stated he is still having significant back pain & would like to know if he needs to be seen again or if there is anything else he can do? Please advise PH# [phone]

## 2011-08-14 NOTE — Telephone Encounter (Signed)
Please advise, Dr.Hopper is out of office until 08/25/11

## 2011-08-18 ENCOUNTER — Emergency Department (HOSPITAL_BASED_OUTPATIENT_CLINIC_OR_DEPARTMENT_OTHER)
Admission: EM | Admit: 2011-08-18 | Discharge: 2011-08-18 | Disposition: A | Discharge status: Home / Self Care | Payer: Medicare Other | Attending: Emergency Medicine | Admitting: Emergency Medicine

## 2011-08-18 ENCOUNTER — Encounter (HOSPITAL_BASED_OUTPATIENT_CLINIC_OR_DEPARTMENT_OTHER): Payer: Self-pay | Admitting: *Deleted

## 2011-08-18 DIAGNOSIS — I2510 Atherosclerotic heart disease of native coronary artery without angina pectoris: Secondary | ICD-10-CM | POA: Insufficient documentation

## 2011-08-18 DIAGNOSIS — E7800 Pure hypercholesterolemia, unspecified: Secondary | ICD-10-CM | POA: Insufficient documentation

## 2011-08-18 DIAGNOSIS — M899 Disorder of bone, unspecified: Secondary | ICD-10-CM | POA: Insufficient documentation

## 2011-08-18 DIAGNOSIS — Z9089 Acquired absence of other organs: Secondary | ICD-10-CM | POA: Insufficient documentation

## 2011-08-18 DIAGNOSIS — Z87891 Personal history of nicotine dependence: Secondary | ICD-10-CM | POA: Insufficient documentation

## 2011-08-18 DIAGNOSIS — I10 Essential (primary) hypertension: Secondary | ICD-10-CM | POA: Insufficient documentation

## 2011-08-18 DIAGNOSIS — Z981 Arthrodesis status: Secondary | ICD-10-CM | POA: Insufficient documentation

## 2011-08-18 DIAGNOSIS — Z951 Presence of aortocoronary bypass graft: Secondary | ICD-10-CM | POA: Insufficient documentation

## 2011-08-18 DIAGNOSIS — K279 Peptic ulcer, site unspecified, unspecified as acute or chronic, without hemorrhage or perforation: Secondary | ICD-10-CM | POA: Insufficient documentation

## 2011-08-18 DIAGNOSIS — Z9861 Coronary angioplasty status: Secondary | ICD-10-CM | POA: Insufficient documentation

## 2011-08-18 DIAGNOSIS — B029 Zoster without complications: Secondary | ICD-10-CM

## 2011-08-18 MED ORDER — VALACYCLOVIR HCL 1 G PO TABS: 1000.0000 mg | ORAL_TABLET | Freq: Three times a day (TID) | ORAL | Status: AC

## 2011-08-18 MED ORDER — HYDROCODONE-ACETAMINOPHEN 5-325 MG PO TABS: 1.0000 | ORAL_TABLET | Freq: Four times a day (QID) | ORAL | Status: AC | PRN

## 2011-08-18 NOTE — ED Notes (Signed)
Abdominal pain and rash x 1 week. Has been seen by his MD. Pt thought he might have shingles but his MD said it is not shingles.

## 2011-08-18 NOTE — Discharge Instructions (Signed)

## 2011-08-18 NOTE — ED Provider Notes (Signed)
History  This chart was scribed for Gerhard Munch, MD by Bennett Scrape. This patient was seen in room MH07/MH07 and the patient's care was started at 5:23PM.  CSN: [id number]  Arrival date & time 08/18/11  1643   First MD Initiated Contact with Patient 08/18/11 1709      Chief Complaint  Patient presents with  . Abdominal Pain    The history is provided by the patient. No language interpreter was used.    [name] is a 77 y.o. male who presents to the Emergency Department complaining of one week of gradual onset, gradually worsening LLQ abdominal pain and left flank pain along with small areas of red and itchy rashes. He denies any modifying factors. He reports that he saw his PCP two days after the symptoms started and was told that it was a food allergy to tropical fruit. He states that he was advised by his PCP to stop eating the tropical fruit. He reports that he has been complaint with his PCP's orders but that his symptoms are getting worse. He denies fever, appetite decrease, diarrhea and emesis. He reports that his daughter evaulated him last week as well and advised him to take valtrex and be reevaluated for shingles. He has a h/o hyperlipidemia, CAD and HTN. He is a daily alcohol user and former smoker.  Pt's PCP is Dr. Alwyn Ren.  Past Medical History  Diagnosis Date  . Hyperlipidemia   . Hypertension       . Gilbert's syndrome   . Osteopenia 2010    t-score -1.9 @ hip (femoral neck)  . CAD (coronary artery disease)   . PUD (peptic ulcer disease)     PMH , ? 1995    Past Surgical History  Procedure Date  . Hemorrhoid surgery   . Cholecystectomy 2004  . Cervical fusion 1997    Dr Jeral Fruit  . Coronary artery bypass graft 2003    Last catheterization was in August 2008 demonstrated a LIMA to th LAD which was patent, there was an atretic saphenous vein graft to the diagonal, the LAD had a 90% stenosis in the large calcified segment, the diagonal has ostial 70%  stenosis, the circumflex had a ramus intermediate with ostial 25% stenosis, the right coronary artery was dominant.  There was an ulcerated 80%-90% stenosis.    . Tonsillectomy and adenoidectomy   . Coronary stent placement     drug-eluting stent to right coronary artery  . Upper gastrointestinal endoscopy      Dr Victorino Dike    Family History  Problem Relation Age of Onset  . Cancer Father     ? primary  . Cancer Daughter     uterine  . Coronary artery disease Maternal Grandmother   . Osteoporosis Sister   . Uterine cancer Daughter     History  Substance Use Topics  . Smoking status: Former Smoker    Quit date: 06/16/1978  . Smokeless tobacco: Never Used  . Alcohol Use: 3.5 oz/week    7 drink(s) per week      Review of Systems  Constitutional: Negative for fever and appetite change.  Respiratory: Negative for cough and shortness of breath.   Cardiovascular: Negative for chest pain.  Gastrointestinal: Positive for abdominal pain. Negative for vomiting, diarrhea and constipation.  Musculoskeletal: Positive for back pain.  Skin: Positive for rash.  All other systems reviewed and are negative.    Allergies  Codeine  Home Medications   Current Outpatient Rx  Name Route Sig Dispense Refill  . ASPIRIN 81 MG PO TABS Oral Take 81 mg by mouth daily.      . ATORVASTATIN CALCIUM 20 MG PO TABS Oral Take 1 tablet (20 mg total) by mouth daily. 90 tablet 2  . CALCIUM 500 PO Oral Take by mouth daily.      [name] CLOPIDOGREL BISULFATE 75 MG PO TABS Oral Take 1 tablet (75 mg total) by mouth daily. 90 tablet 3  . GLUCOSAMINE HCL PO Oral Take 1 tablet by mouth daily.     [name] METOPROLOL TARTRATE 25 MG PO TABS Oral Take 0.5 tablets (12.5 mg total) by mouth 2 (two) times daily. 90 tablet 3  . MULTIVITAMIN PO Oral Take 1 tablet by mouth daily.     [name] NITROGLYCERIN 0.4 MG SL SUBL Sublingual Place 0.4 mg under the tongue every 5 (five) minutes as needed.      [name] RANITIDINE HCL 150 MG PO CAPS  Oral Take 150 mg by mouth 2 (two) times daily.      [name] VALACYCLOVIR HCL 500 MG PO TABS Oral Take 500 mg by mouth 2 (two) times daily.    [name] VITAMIN D (CHOLECALCIFEROL) PO Oral Take 1 capsule by mouth daily.       Triage Vitals: BP 154/69  Pulse 77  Resp 20  Wt 161 lb (73.029 kg)  SpO2 98%  Physical Exam  Nursing note and vitals reviewed. Constitutional: He is oriented to person, place, and time. He appears well-developed and well-nourished.  HENT:  Head: Normocephalic and atraumatic.  Eyes: Conjunctivae and EOM are normal.  Neck: Normal range of motion. Neck supple.  Cardiovascular: Normal rate and regular rhythm.   Pulmonary/Chest: Effort normal and breath sounds normal.  Abdominal: Soft. Bowel sounds are normal.  Musculoskeletal: Normal range of motion. He exhibits no edema.  Neurological: He is alert and oriented to person, place, and time. No cranial nerve deficit.  Skin: Skin is warm and dry. Rash (small patches of erythemedas macular rash on left side of body) noted.  Psychiatric: He has a normal mood and affect. His behavior is normal.    ED Course  Procedures (including critical care time)  DIAGNOSTIC STUDIES: Oxygen Saturation is 98% on room air, normal by my interpretation.    COORDINATION OF CARE: 5:29PM-Discussed shingles symptoms and management with pt and pt agreed to plan.   Labs Reviewed - No data to display No results found.   No diagnosis found.    MDM  I personally performed the services described in this documentation, which was scribed in my presence. The recorded information has been reviewed and considered.   This generally well male presents with several days of pain about his left upper abdomen and left mid back.  Patient's description of pain that has been present with new rash, not crossing the midline is consistent with shingles.  The absence of distress, anorexia, nausea, vomiting, other notable complaints his reassuring.  The patient has  already initiated also clear.  He was provided with appropriate dosing and provided analgesics.  He was discharged in stable condition.   Gerhard Munch, MD 08/18/11 [phone]

## 2011-08-19 NOTE — Progress Notes (Signed)
  Subjective:    Patient ID: [name], male    DOB: [birth year], 77 y.o.   MRN: [id number]  HPI    Review of Systems     Objective:   Physical Exam        Assessment & Plan:  See 08/18/11 ER note. History & physical TOTALLY different as now "rash" & pain LLQ.He will have to be monitored for diverticulitis if symptoms fail to resolve.

## 2011-08-28 ENCOUNTER — Ambulatory Visit: Payer: Medicare Other | Admitting: Internal Medicine

## 2011-08-28 ENCOUNTER — Ambulatory Visit (INDEPENDENT_AMBULATORY_CARE_PROVIDER_SITE_OTHER): Payer: Medicare Other | Admitting: Internal Medicine

## 2011-08-28 VITALS — BP 118/76 | HR 67 | Temp 98.4°F | Ht 65.0 in | Wt 164.0 lb

## 2011-08-28 DIAGNOSIS — B0229 Other postherpetic nervous system involvement: Secondary | ICD-10-CM

## 2011-08-28 MED ORDER — GABAPENTIN 100 MG PO CAPS: 100.0000 mg | ORAL_CAPSULE | Freq: Three times a day (TID) | ORAL | Status: DC

## 2011-08-28 NOTE — Patient Instructions (Signed)
Assess response to the gabapentin one every 8 hours as needed. If it is partially beneficial, it can be increased up to a total of 3 pills every 8 hours as needed. This increase of 1 pill each dose  should take place over 72 hours at least. 

## 2011-08-28 NOTE — Progress Notes (Signed)
  Subjective:    Patient ID: [name], male    DOB: May 15, 1935, 77 y.o.   MRN: [id number]  HPI He has 3 days left of  his genericValtrex which was prescribed with hydrocodone/acetominphen on 08/18/11 in the urgent care. The rash broke out in the  T8-8 dermatome on 08/14/11 and has been associated with some neuropathic type pain when he rotates his thorax or if he stretches and the clothing irritates the rash.  Phone notes 2/28 were reviewed; the physician read my note and assume he was having radicular pain related to chronic low back issues.  He did come in at 4:25 PM on 3/4 but could not  be seen    Review of Systems he denies fever, chills or sweats     Objective:   Physical Exam  He appears healthy and well-nourished  There are small papules in the dermatome is noted. No blistering lesions or secondary cellulitis is present.   He has no lymphadenopathy about the neck or axilla        Assessment & Plan:  #1 herpes zoster, T8 dermatome; no evidence of secondary infection. Lesions are resolving well. He should complete at least 7 if not 10 full days of Valtrex until symptoms totally resolve  #2 postherpetic neuralgia, mild. Low dose gabapentin would be appropriate every 8 hours as needed for the neuropathic pain.  The pathophysiology of shingles & its relationship to chickenpox & its communicability were discussed

## 2011-11-03 ENCOUNTER — Telehealth: Payer: Self-pay | Admitting: Internal Medicine

## 2011-11-03 NOTE — Telephone Encounter (Signed)
OK with Dr Marylou Flesher but see below: Specialists & insurance organizations usually   require an updated, current  assessment and written note from the Primary Care physician  to review before they  schedule an appointment to assess symptoms or problems. If we do not have such  a current  assessment of your health issue or complaint in the chart (electronic medical record);you will need to  make an appointment to create this document if  THEY REQUEST this

## 2011-11-03 NOTE — Telephone Encounter (Signed)
Dr.Hopper please advise if you agree with patient's request

## 2011-11-03 NOTE — Telephone Encounter (Signed)
Discuss with patient, appt scheduled to discuss.

## 2011-11-05 ENCOUNTER — Encounter: Payer: Self-pay | Admitting: Internal Medicine

## 2011-11-05 ENCOUNTER — Ambulatory Visit (INDEPENDENT_AMBULATORY_CARE_PROVIDER_SITE_OTHER): Payer: Medicare Other | Admitting: Internal Medicine

## 2011-11-05 VITALS — BP 124/78 | HR 77 | Temp 97.9°F

## 2011-11-05 DIAGNOSIS — G478 Other sleep disorders: Secondary | ICD-10-CM

## 2011-11-05 DIAGNOSIS — G479 Sleep disorder, unspecified: Secondary | ICD-10-CM

## 2011-11-05 DIAGNOSIS — R413 Other amnesia: Secondary | ICD-10-CM

## 2011-11-05 NOTE — Patient Instructions (Signed)
Please review the history and make corrections or additions. Keep a diary of your symptoms.Share results with Dr Marylou Flesher

## 2011-11-05 NOTE — Progress Notes (Signed)
  Subjective:    Patient ID: [name], male    DOB: 12-26-1934, 77 y.o.   MRN: [id number]  HPI  Sleep Dysfunction: Onset:2011 Pattern: Nocturnal hyperactivity with limb movement and talking as per his wife's direct observations. Dr Dohmier Rxed Clonazepam without definite benefit. The symptoms were infrequent enough  that he did not pursue followup. In the last 6 months his wife describes nightly symptoms.He denies any visual or ocular worry hallucinations with these events. Difficulty going to sleep:no Frequent awakening:no Early awakening:no Nightmares:yes Abnormal leg movement:yes Snoring:yes Apnea:intermittently Risk factors/sleep hygiene: Stimulants:no Alcohol intake:only with meal Reading, watching TV, eating in bed:no Daytime naps:no Stress/anxiety:no Work/travel factors:no Impact: Daytime hypersomnolence: no Motor vehicle accident/motor dysfunction:no Treatment to date/efficacy: no recent treatment except restraining belt    He does question some memory loss      Review of Systems Constitutional: No fever, chills, significant weight change, fatigue,  or night sweats Eyes:  blurred vision, double vision, or loss of vision ENT/mouth:  hoarseness   Cardiovascular: no chest pain, palpitations, racing, irregular rhythm, syncope  Respiratory: No cough, sputum production. Some exertional  Dyspnea ; no  paroxysmal nocturnal dyspnea Neurologic: No headache, vertigo, limb weakness, tremor, gait disturbance, seizures, memory loss. He has numbness in the fourth and fifth left fingers. He does have some weakness in his legs . Psychiatric: No significant anxiety or depression, anhedonia, panic attacks or anorexia Endocrine: No change in hair/skin/ nails, excessive thirst Hematologic/lymphatic: No bruising, lymphadenopathy,or  abnormal clotting . On Plavix       Objective:   Physical Exam Gen. appearance: Well-nourished, in no distress.Appears younger than stated age    Eyes: Extraocular motion intact, field of vision normal, vision grossly intact, no nystagmus. Sty OS upper lid ENT: Canals clear, tympanic membranes normal, tuning fork exam normal, hearing grossly normal Neck: Normal range of motion, no masses, normal thyroid Cardiovascular: Rate and rhythm normal; no murmurs, gallops or extra heart sounds Muscle skeletal:  tone, &  strength normal Neuro:no cranial nerve deficit, deep tendon  reflexes normal, gait normal. Romberg and finger to nose testing normal. No pass pointing Lymph: No cervical or axillary LA Skin: Warm and dry without suspicious lesions or rashes Psych: no anxiety or mood change. Normally interactive and cooperative.  MMSE: 30 out of 30        Assessment & Plan:  #1 sleep disorder manifested by limb overactivity and focal activity of which he is not aware. There is also history of some snoring as well as possible mild apnea.  #2 subjective memory deficit; see Mini-Mental Status exam  Plan: See orders and recommendations

## 2011-12-30 ENCOUNTER — Telehealth: Payer: Self-pay | Admitting: *Deleted

## 2011-12-30 NOTE — Telephone Encounter (Signed)
Pt left vm on triage line requesting that someone call him back, no explanation noted

## 2011-12-30 NOTE — Telephone Encounter (Signed)
I called the patient and he mentioned he did not have any concerns

## 2012-01-22 ENCOUNTER — Telehealth: Payer: Self-pay | Admitting: Cardiology

## 2012-01-22 DIAGNOSIS — I2510 Atherosclerotic heart disease of native coronary artery without angina pectoris: Secondary | ICD-10-CM

## 2012-01-22 MED ORDER — CLOPIDOGREL BISULFATE 75 MG PO TABS: 75.0000 mg | ORAL_TABLET | Freq: Every day | ORAL | Status: DC

## 2012-01-22 NOTE — Telephone Encounter (Signed)
New Problem:    Patient called in needing a refill of his clopidogrel (PLAVIX) 75 MG tablet sent into [facility].  Please call back once the order has been placed.

## 2012-01-22 NOTE — Telephone Encounter (Signed)
..   Requested Prescriptions   Signed Prescriptions Disp Refills  . clopidogrel (PLAVIX) 75 MG tablet 90 tablet 1    Sig: Take 1 tablet (75 mg total) by mouth daily.    Authorizing Provider: Rollene Rotunda    Ordering User: Christella Hartigan, Eulamae Greenstein M  Please call to make appointment for Nov 2013

## 2012-01-23 ENCOUNTER — Ambulatory Visit (INDEPENDENT_AMBULATORY_CARE_PROVIDER_SITE_OTHER): Payer: Medicare Other | Admitting: Family Medicine

## 2012-01-23 ENCOUNTER — Encounter: Payer: Self-pay | Admitting: Family Medicine

## 2012-01-23 VITALS — BP 126/80 | HR 78 | Temp 98.1°F | Ht 63.5 in | Wt 164.0 lb

## 2012-01-23 DIAGNOSIS — S139XXA Sprain of joints and ligaments of unspecified parts of neck, initial encounter: Secondary | ICD-10-CM

## 2012-01-23 DIAGNOSIS — S161XXA Strain of muscle, fascia and tendon at neck level, initial encounter: Secondary | ICD-10-CM | POA: Insufficient documentation

## 2012-01-23 MED ORDER — CYCLOBENZAPRINE HCL 5 MG PO TABS: ORAL_TABLET | ORAL | Status: DC

## 2012-01-23 NOTE — Patient Instructions (Addendum)
This is a muscle strain- likely from the higher pillow Take tylenol for pain relief Use the flexeril (muscle relaxer) prior to bed Heat! Consider switching to a lower pillow Call with any questions or concerns Hang in there!!

## 2012-01-23 NOTE — Progress Notes (Signed)
  Subjective:    Patient ID: [name], male    DOB: 1934/10/15, 77 y.o.   MRN: [id number]  HPI Neck pain- sxs started yesterday evening.  Pain is on L behind the ear.  Painful to turn head side to side.  Able to look up and down w/out difficulty.  Hx of similar- 'but this is worse'.  Got a new pillow recently.  No heavy lifting or straining.  No numbness or weakness of UE.   Review of Systems For ROS see HPI     Objective:   Physical Exam  Vitals reviewed. Constitutional: He is oriented to person, place, and time. He appears well-developed and well-nourished. No distress.  Neck: Neck supple.  Musculoskeletal: He exhibits tenderness (mild TTP over L upper trap).       Some limited range of horizontal motion due to pain Full ROM of vertically (-) Sperling's  Lymphadenopathy:    He has no cervical adenopathy.  Neurological: He is alert and oriented to person, place, and time. He has normal reflexes.          Assessment & Plan:

## 2012-01-23 NOTE — Assessment & Plan Note (Signed)
New.  Due to plavix will hold off on NSAIDs, pt to use tylenol for pain.  Low dose muscle relaxer prn.  Discussed switching back to lower pillow to avoid straining neck (pt sleeps on L side).  Heat prn.  Reviewed supportive care and red flags that should prompt return.  Pt expressed understanding and is in agreement w/ plan.

## 2012-02-24 ENCOUNTER — Encounter: Payer: Medicare Other | Admitting: Internal Medicine

## 2012-03-02 ENCOUNTER — Ambulatory Visit (INDEPENDENT_AMBULATORY_CARE_PROVIDER_SITE_OTHER): Payer: Medicare Other | Admitting: Internal Medicine

## 2012-03-02 ENCOUNTER — Encounter: Payer: Self-pay | Admitting: Internal Medicine

## 2012-03-02 VITALS — BP 126/72 | HR 75 | Temp 98.0°F | Resp 12 | Ht 65.03 in | Wt 163.0 lb

## 2012-03-02 DIAGNOSIS — K219 Gastro-esophageal reflux disease without esophagitis: Secondary | ICD-10-CM

## 2012-03-02 DIAGNOSIS — Z23 Encounter for immunization: Secondary | ICD-10-CM

## 2012-03-02 DIAGNOSIS — I10 Essential (primary) hypertension: Secondary | ICD-10-CM

## 2012-03-02 DIAGNOSIS — I2510 Atherosclerotic heart disease of native coronary artery without angina pectoris: Secondary | ICD-10-CM

## 2012-03-02 DIAGNOSIS — M129 Arthropathy, unspecified: Secondary | ICD-10-CM

## 2012-03-02 DIAGNOSIS — M949 Disorder of cartilage, unspecified: Secondary | ICD-10-CM

## 2012-03-02 DIAGNOSIS — E782 Mixed hyperlipidemia: Secondary | ICD-10-CM

## 2012-03-02 DIAGNOSIS — Z0000 Encounter for general adult medical examination without abnormal findings: Secondary | ICD-10-CM

## 2012-03-02 DIAGNOSIS — M899 Disorder of bone, unspecified: Secondary | ICD-10-CM

## 2012-03-02 LAB — LIPID PANEL
Cholesterol: 166 mg/dL (ref 0–200)
HDL: 45.1 mg/dL (ref >39.00)
Triglycerides: 81 mg/dL (ref 0.0–149.0)

## 2012-03-02 LAB — CBC WITH DIFFERENTIAL/PLATELET
Basophils Absolute: 0 10*3/uL (ref 0.0–0.1)
Basophils Relative: 0.7 % (ref 0.0–3.0)
Eosinophils Absolute: 0.6 10*3/uL (ref 0.0–0.7)
Hemoglobin: 16 g/dL (ref 13.0–17.0)
MCHC: 33.9 g/dL (ref 30.0–36.0)
MCV: 90.5 fl (ref 78.0–100.0)
Monocytes Absolute: 0.5 10*3/uL (ref 0.1–1.0)
Neutro Abs: 4.4 10*3/uL (ref 1.4–7.7)
RBC: 5.2 Mil/uL (ref 4.22–5.81)
RDW: 12.4 % (ref 11.5–14.6)

## 2012-03-02 LAB — BASIC METABOLIC PANEL
CO2: 26 mEq/L (ref 19–32)
Chloride: 98 mEq/L (ref 96–112)
Creatinine, Ser: 0.9 mg/dL (ref 0.4–1.5)
Glucose, Bld: 99 mg/dL (ref 70–99)
Sodium: 138 mEq/L (ref 135–145)

## 2012-03-02 LAB — HEPATIC FUNCTION PANEL
Albumin: 4.4 g/dL (ref 3.5–5.2)
Total Protein: 7.8 g/dL (ref 6.0–8.3)

## 2012-03-02 LAB — TSH: TSH: 1.45 u[IU]/mL (ref 0.35–5.50)

## 2012-03-02 MED ORDER — NITROGLYCERIN 0.4 MG SL SUBL: 0.4000 mg | SUBLINGUAL_TABLET | SUBLINGUAL | Status: DC | PRN

## 2012-03-02 NOTE — Patient Instructions (Addendum)
Preventive Health Care: Exercise at least 30-45 minutes a day,  3-4 days a week.  Eat a low-fat diet with lots of fruits and vegetables, up to 7-9 servings per day.  Consume less than 40 grams of sugar per day from foods & drinks with High Fructose Corn Sugar as #2,3 or # 4 on label. Health Care Power of Attorney & Living Will. Complete if not in place ; these place you in charge of your health care decisions. Review and correct the record as indicated. Please share record with all medical staff seen.

## 2012-03-02 NOTE — Progress Notes (Signed)
Subjective:    Patient ID: [name], male    DOB: 04-18-1935, 77 y.o.   MRN: [id number]  HPI Medicare Wellness Visit:  The following psychosocial & medical history were reviewed as required by Medicare.   Social history: caffeine: 1 cup expresso & green tea  1/2 liter / day , alcohol:  7 drinks/ week ,  tobacco use : quit 1980  & exercise : limited by arthritis.   Home & personal  safety / fall risk: intermittent issues such as climbing stairs, activities of daily living: no limitations , seatbelt use : yes , and smoke alarm employment : yes .  Power of Attorney/Living Will status : incomplete  Vision ( as recorded per Nurse) & Hearing  evaluation :  Ophth 6/13; no hearing exam. Orientation :oriented x 3 , memory & recall:  good, spelling  testing: good,and mood & affect : normal . Depression / anxiety: some anxiety Travel history : [place]  2011 , immunization status :Flu shot today , transfusion history:  no, and preventive health surveillance ( colonoscopies, BMD , etc as per protocol/ [facility]): colonoscopy ? Up to date, Dental care:   implants & denture . Chart reviewed &  Updated. Active issues reviewed & addressed.       Review of Systems HYPERTENSION: Disease Monitoring: Blood pressure range-controlled  Chest pain, palpitations- no       Dyspnea- with exertion especially stairs; Cardiology evaluation 9/18 Medications: Compliance- on B blocker Lightheadedness,Syncope- no    Edema- no  HYPERLIPIDEMIA: Disease Monitoring: See symptoms for Hypertension Medications: Compliance- on Atorvastatin 4 days/ week  Abd pain, bowel changes- no   Muscle aches-no  GERD: No dysphagia; unexplained weight loss; melena or rectal bleeding. Rantidine as needed ( on Plavix)  Arthritis: Salonpas patch of benefit        Objective:   Physical Exam Gen.:Appears younger than stated age  ; well-nourished in appearance. Alert, appropriate and cooperative throughout exam. Head:  Normocephalic without obvious abnormalities  Eyes: No corneal or conjunctival inflammation noted. No icterus. Arcus Ears: External  ear exam reveals no significant lesions or deformities. Wax on R, L TM normal. Hearing is grossly normal bilaterally. Nose: External nasal exam reveals no deformity or inflammation. Nasal mucosa are pink and moist. No lesions or exudates noted.  Mouth: Oral mucosa and oropharynx reveal no lesions or exudates. Teeth in good repair. Neck: No deformities, masses, or tenderness noted. Range of motion decreased. Thyroid normal. Lungs: Normal respiratory effort; chest expands symmetrically. Lungs are clear to auscultation without rales, wheezes, or increased work of breathing. Heart: Normal rate and rhythm. Normal S1 and S2. No gallop, click, or rub. S4 w/o  murmur. Abdomen: Bowel sounds normal; abdomen soft and nontender. No masses, organomegaly or hernias noted. Genitalia/ DRE: Genitalia normal except for small varices bilaterally. Prostate reveals no enlargement, asymmetry, or nodularity. Equivocal induration on L.  Musculoskeletal/extremities: No deformity or scoliosis noted of  the thoracic or lumbar spine. No clubbing, cyanosis, edema, or deformity noted. Range of motion  normal .Tone & strength  normal.Joints normal. Nail health  good. Vascular: Carotid, radial artery, dorsalis pedis and  posterior tibial pulses are full and equal. No bruits present. Neurologic: Alert and oriented x3. Deep tendon reflexes symmetrical and normal.          Skin: Intact without suspicious lesions or rashes. Lymph: No cervical, axillary, or inguinal lymphadenopathy present. Psych: Mood and affect are normal. Normally interactive  Assessment & Plan:  #1 Medicare Wellness Exam; criteria met ; data entered #2 Problem List reviewed ; Assessment/ Recommendations made Plan: see Orders

## 2012-03-03 ENCOUNTER — Ambulatory Visit (INDEPENDENT_AMBULATORY_CARE_PROVIDER_SITE_OTHER): Payer: Medicare Other | Admitting: Cardiology

## 2012-03-03 ENCOUNTER — Encounter: Payer: Self-pay | Admitting: Cardiology

## 2012-03-03 VITALS — BP 133/72 | HR 78 | Ht 64.0 in | Wt 163.0 lb

## 2012-03-03 DIAGNOSIS — I25810 Atherosclerosis of coronary artery bypass graft(s) without angina pectoris: Secondary | ICD-10-CM

## 2012-03-03 NOTE — Patient Instructions (Addendum)
Please hold Lipitor for 2 months, stop Plavix.  Continue all other medications as listed.  Follow up with Dr Antoine Poche in 2 months.

## 2012-03-03 NOTE — Progress Notes (Signed)
HPI The patient presents for follow up of CAD.  At the last visit I sent him for a stress perfusion study which demonstrated an EF of 62% and a small fixed apical defect. Since that time he's had no new cardiovascular complaints. That he is he's had no chest pressure, neck or arm discomfort. He's not having palpitations, presyncope or syncope. He's had no weight gain or edema. However, he has had fatigue and muscle aches. These are limiting him somewhat and when he is doing.  Allergies  Allergen Reactions  . Codeine Nausea Only    Current Outpatient Prescriptions  Medication Sig Dispense Refill  . aspirin 81 MG tablet Take 81 mg by mouth daily.        [name] atorvastatin (LIPITOR) 20 MG tablet Take 20 mg by mouth daily. Take on mon, wed, fri and sat      . Calcium Carbonate (CALCIUM 500 PO) Take by mouth daily.        . Cholecalciferol (VITAMIN D-3 PO) Take 2,000 Units by mouth daily.      . clonazePAM (KLONOPIN) 0.5 MG tablet Take 0.5 mg by mouth daily as needed.      . clopidogrel (PLAVIX) 75 MG tablet Take 1 tablet (75 mg total) by mouth daily.  90 tablet  1  . fish oil-omega-3 fatty acids 1000 MG capsule Take 1,400 g by mouth daily.      [name] GLUCOSAMINE HCL PO Take by mouth daily. 1/2 by mouth daily      . Loratadine (CLARITIN PO) Take by mouth daily.      . Methylsulfonylmethane (MSM) 750 MG CAPS Take by mouth daily.      . metoprolol tartrate (LOPRESSOR) 25 MG tablet Take 25 mg by mouth daily.      . Multiple Vitamin (MULTIVITAMIN PO) Take 1 tablet by mouth daily.       . nitroGLYCERIN (NITROSTAT) 0.4 MG SL tablet Place 1 tablet (0.4 mg total) under the tongue every 5 (five) minutes as needed.  25 tablet  11  . ranitidine (ZANTAC) 150 MG capsule Take 150 mg by mouth 2 (two) times daily.        [name] VITAMIN D, CHOLECALCIFEROL, PO Take 1 capsule by mouth daily.       [name] DISCONTD: atorvastatin (LIPITOR) 20 MG tablet Take 1 tablet (20 mg total) by mouth daily.  90 tablet  2    Past Medical  History  Diagnosis Date  . Hyperlipidemia   . Hypertension   . Diverticulosis   . Gilbert's syndrome   . Osteopenia 2010     T score -1.9 @ hip (femoral neck)  . CAD (coronary artery disease)     Dr Antoine Poche  . PUD (peptic ulcer disease)     PMH , ? 1995    Past Surgical History  Procedure Date  . Hemorrhoid surgery   . Cholecystectomy 2004  . Cervical fusion 1997    Dr Jeral Fruit  . Coronary artery bypass graft 2003    Last catheterization was in August 2008 demonstrated a LIMA to th LAD which was patent, there was an atretic saphenous vein graft to the diagonal, the LAD had a 90% stenosis in the large calcified segment, the diagonal has ostial 70% stenosis, the circumflex had a ramus intermediate with ostial 25% stenosis, the right coronary artery was dominant.  There was an ulcerated 80%-90% stenosis.    . Tonsillectomy and adenoidectomy   . Coronary stent placement 2008  drug-eluting stent to right coronary artery  . Upper gastrointestinal endoscopy      Dr Victorino Dike  . Colonoscopy     ROS:  Muscle aches.  Otherwise as stated in the HPI and negative for all other systems.   PHYSICAL EXAM BP 133/72  Pulse 78  Ht 5\' 4"  (1.626 m)  Wt 163 lb (73.936 kg)  BMI 27.98 kg/m2 GENERAL:  Well appearing HEENT:  Pupils equal round and reactive, fundi not visualized, oral mucosa unremarkable, dentures NECK:  No jugular venous distention, waveform within normal limits, carotid upstroke brisk and symmetric, no bruits, no thyromegaly LYMPHATICS:  No cervical, inguinal adenopathy LUNGS:  Clear to auscultation bilaterally BACK:  No CVA tenderness CHEST:  Well healed sternotomy scar. HEART:  PMI not displaced or sustained,S1 and S2 within normal limits, no S3, no S4, no clicks, no rubs, no murmurs ABD:  Flat, positive bowel sounds normal in frequency in pitch, no bruits, no rebound, no guarding, no midline pulsatile mass, no hepatomegaly, no splenomegaly EXT:  2 plus pulses throughout,  no edema, no cyanosis no clubbing SKIN:  No rashes no nodules NEURO:  Cranial nerves II through XII grossly intact, motor grossly intact throughout PSYCH:  Cognitively intact, oriented to person place and time  EKG:  Sinus rhythm, rate 78, axis within normal limits, intervals within normal limits, no acute ST-T wave changes.  ASSESSMENT AND PLAN  CORONARY ARTERY DISEASE -  The patient has had no new symptoms since his stress perfusion study last year. He will continue with risk reduction. He can stop his Plavix.  HYPERTENSION, ESSENTIAL - The blood pressure is at target. No change in medications is indicated. We will continue with therapeutic lifestyle changes (TLC).   HYPERLIPIDEMIA - Given his muscle aches I will give him a two month trial off of Lipitor to see if this helps.  If his discomfort is unchanged in 2 months he will restart a statin. He gets better he will restart it to see if symptoms return. He will let me know. If we demonstrate a definite correlation with his muscle aches and Lipitor we will try another agent.  FATIGUE - I did review labs drawn yesterday and his thyroid was normal. He was not anemic. The plan will be as above hold his statin.

## 2012-03-04 NOTE — Progress Notes (Signed)
Left message on machine for patient to call back.

## 2012-03-26 ENCOUNTER — Telehealth: Payer: Self-pay | Admitting: Internal Medicine

## 2012-03-26 NOTE — Telephone Encounter (Signed)
Pt was calling regarding his wife results, which he has seen in My Chart.

## 2012-03-26 NOTE — Telephone Encounter (Signed)
Pt has called four times this morning and NEEDS someone to call him. Wants results from urgent care last night.

## 2012-04-28 ENCOUNTER — Ambulatory Visit (INDEPENDENT_AMBULATORY_CARE_PROVIDER_SITE_OTHER): Payer: Medicare Other | Admitting: Cardiology

## 2012-04-28 ENCOUNTER — Encounter: Payer: Self-pay | Admitting: Cardiology

## 2012-04-28 VITALS — BP 146/80 | HR 73 | Ht 64.0 in | Wt 164.4 lb

## 2012-04-28 DIAGNOSIS — E782 Mixed hyperlipidemia: Secondary | ICD-10-CM

## 2012-04-28 DIAGNOSIS — I10 Essential (primary) hypertension: Secondary | ICD-10-CM

## 2012-04-28 DIAGNOSIS — I2510 Atherosclerotic heart disease of native coronary artery without angina pectoris: Secondary | ICD-10-CM

## 2012-04-28 MED ORDER — PRAVASTATIN SODIUM 40 MG PO TABS: 40.0000 mg | ORAL_TABLET | Freq: Every evening | ORAL | Status: DC

## 2012-04-28 NOTE — Patient Instructions (Addendum)
Please start Pravastatin 40 mg a day Continue all other medications as listed  Please return for fasting lipid and liver profile in 8 weeks  Follow up in 1 year with Dr Antoine Poche.  You will receive a letter in the mail 2 months before you are due.  Please call us when you receive this letter to schedule your follow up appointment.

## 2012-04-28 NOTE — Progress Notes (Signed)
HPI The patient presents for follow up of CAD.  At the last visit I took him off of Lipitor as he was complaining of muscle aches. He does think that he is joint pains and leg pains are improved though he has had some atypical pain in the posterior side of his neck on the right. He wakes up in the morning with this and it improves through the day. Since that time he's had no new cardiovascular complaints. That he is he's had no chest pressure, neck or arm discomfort. He's not having palpitations, presyncope or syncope. He's had no weight gain or edema.   Allergies  Allergen Reactions  . Codeine Nausea Only    Current Outpatient Prescriptions  Medication Sig Dispense Refill  . aspirin 81 MG tablet Take 81 mg by mouth daily.        . Calcium Carbonate (CALCIUM 500 PO) Take by mouth daily.        . Cholecalciferol (VITAMIN D-3 PO) Take 2,000 Units by mouth daily.      . fish oil-omega-3 fatty acids 1000 MG capsule Take 1,400 g by mouth daily.      . Flaxseed, Linseed, (FLAX SEEDS PO) Take by mouth daily.      . Loratadine (CLARITIN PO) Take by mouth daily.      . metoprolol tartrate (LOPRESSOR) 25 MG tablet Take 25 mg by mouth daily.      . Multiple Vitamin (MULTIVITAMIN PO) Take 1 tablet by mouth daily.       . nitroGLYCERIN (NITROSTAT) 0.4 MG SL tablet Place 1 tablet (0.4 mg total) under the tongue every 5 (five) minutes as needed.  25 tablet  11  . ranitidine (ZANTAC) 150 MG capsule Take 150 mg by mouth 2 (two) times daily.          Past Medical History  Diagnosis Date  . Hyperlipidemia   . Hypertension   . Diverticulosis   . Gilbert's syndrome   . Osteopenia 2010     T score -1.9 @ hip (femoral neck)  . CAD (coronary artery disease)     Dr Antoine Poche  . PUD (peptic ulcer disease)     PMH , ? 1995    Past Surgical History  Procedure Date  . Hemorrhoid surgery   . Cholecystectomy 2004  . Cervical fusion 1997    Dr Jeral Fruit  . Coronary artery bypass graft 2003    Last  catheterization was in August 2008 demonstrated a LIMA to th LAD which was patent, there was an atretic saphenous vein graft to the diagonal, the LAD had a 90% stenosis in the large calcified segment, the diagonal has ostial 70% stenosis, the circumflex had a ramus intermediate with ostial 25% stenosis, the right coronary artery was dominant.  There was an ulcerated 80%-90% stenosis.    . Tonsillectomy and adenoidectomy   . Coronary stent placement 2008    drug-eluting stent to right coronary artery  . Upper gastrointestinal endoscopy      Dr Victorino Dike  . Colonoscopy     ROS:  As stated in the HPI and negative for all other systems.   PHYSICAL EXAM BP 146/80  Pulse 73  Ht 5\' 4"  (1.626 m)  Wt 164 lb 6.4 oz (74.571 kg)  BMI 28.22 kg/m2 GENERAL:  Well appearing NECK:  No jugular venous distention, waveform within normal limits, carotid upstroke brisk and symmetric, no bruits, no thyromegaly LUNGS:  Clear to auscultation bilaterally BACK:  No CVA tenderness  CHEST:  Well healed sternotomy scar. HEART:  PMI not displaced or sustained,S1 and S2 within normal limits, no S3, no S4, no clicks, no rubs, no murmurs ABD:  Flat, positive bowel sounds normal in frequency in pitch, no bruits, no rebound, no guarding, no midline pulsatile mass, no hepatomegaly, no splenomegaly EXT:  2 plus pulses throughout, no edema, no cyanosis no clubbing  EKG:  Sinus rhythm, rate 78, axis within normal limits, intervals within normal limits, no acute ST-T wave changes.  ASSESSMENT AND PLAN  CORONARY ARTERY DISEASE -  The patient has had no new symptoms since his stress perfusion study last year. He will continue with risk reduction. He  stopped his Plavix at the last visit.[name]  HYPERTENSION, ESSENTIAL - The blood pressure is slightly elevated today but this is unusual. No change in medications is indicated. We will continue with therapeutic lifestyle changes (TLC).   HYPERLIPIDEMIA - He is having fewer  muscle aches off of the Lipitor. I will try pravastatin 40 mg and repeat a lipid profile in 8 weeks.

## 2012-05-14 ENCOUNTER — Other Ambulatory Visit: Payer: Self-pay | Admitting: Cardiology

## 2012-05-17 ENCOUNTER — Telehealth: Payer: Self-pay | Admitting: Cardiology

## 2012-05-17 ENCOUNTER — Telehealth: Payer: Self-pay | Admitting: Internal Medicine

## 2012-05-17 NOTE — Telephone Encounter (Signed)
Pt calling re a call he got reminding him of an appt for 12-4 for a lower arterial test, he wants to talk to nurse he says it not for him, he doesn't have a problem with his lower arteries, pls call [phone]

## 2012-05-17 NOTE — Telephone Encounter (Signed)
Discuss with patient  

## 2012-05-17 NOTE — Telephone Encounter (Signed)
Dr Antoine Poche did not order this testing.  According to the information in appts. The referring physician is listed as Dr Alwyn Ren - will see if he ordered.  Will forward this phone note to Charlynn Court to call pt and cancel the appt.

## 2012-05-17 NOTE — Telephone Encounter (Signed)
I know nothing about such orders

## 2012-05-17 NOTE — Telephone Encounter (Signed)
Patient is calling asking why he is scheduled for Lower Ext Arterial this week, and for an Abdominal Aortic U/S & Consult in Feb-2014.  Patient states he has never had any problems that would cause him to need these type tests, and states he has been scheduled in error, that this should be for another patient.  Patient stated he called [facility] & was told that our office/Dr. Alwyn Ren requested these tests be done, but I see no order nor referrals.  Please advise if patient has been scheduled in error.

## 2012-05-31 ENCOUNTER — Ambulatory Visit (INDEPENDENT_AMBULATORY_CARE_PROVIDER_SITE_OTHER): Payer: Medicare Other | Admitting: Internal Medicine

## 2012-05-31 ENCOUNTER — Encounter: Payer: Self-pay | Admitting: Internal Medicine

## 2012-05-31 DIAGNOSIS — R1013 Epigastric pain: Secondary | ICD-10-CM

## 2012-05-31 MED ORDER — ESOMEPRAZOLE MAGNESIUM 40 MG PO CPDR: 40.0000 mg | DELAYED_RELEASE_CAPSULE | Freq: Every day | ORAL | Status: DC

## 2012-05-31 NOTE — Progress Notes (Signed)
  Subjective:    Patient ID: [name], male    DOB: July 14, 1934, 77 y.o.   MRN: [id number]  HPI  He began to have sharp diffuse abdominal pain last night which did radiate to the flanks bilaterally. It lasted 15-20 minutes. It returned this am for 15 -35 minutes .Itis not present at this time. Flatus appeared to help as did the ascending a prayer position with his head at his knees. Also lying in the lateral decubitus position in a fetal position was of benefit.  He has a past medical history of ulcers in 1995. He has reflux and diverticulosis. Upper endoscopy and colonoscopy were performed remotely.     Review of Systems Nausea/Vomiting: no Diarrhea: no  Constipation: last 12/12; relieved with fiber  Melena/BRBPR: no  Hematemesis/ dysphagia :no but some "acid "for which he takes TUMS frequently & OTC Zantac infrequently Anorexia:no Fever/Chills: no Dysuria/hematuria/pyuria: no Rash: no Wt loss:no  He also has some discomfort in the right posterior neck and trapezius area for which he uses topical anti-inflammatory medications and Tylenol at bedtime with some response        Objective:   Physical Exam General appearance is one of good health and nourishment w/o distress.  Eyes: No conjunctival inflammation or scleral icterus is present. Arcus   Range of motion of the neck is full. The neck is supple passive flexion  Oral exam: Upper plate & lower partial; lips and gums are healthy appearing.There is no oropharyngeal erythema or exudate noted.   Heart:  Normal rate and regular rhythm. S1 and S2 normal without gallop,  click, rub or other extra sounds  . Grade 1 systolic murmur loudest at the right base  No carotid bruits present. All pulses intact   Lungs:Chest clear to auscultation; no wheezes, rhonchi,rales ,or rubs present.No increased work of breathing.   Abdomen: bowel sounds normal, soft but slightly tender in epigastrium without masses, organomegaly or hernias  noted.  No guarding or rebound   Skin:Warm & dry.  Intact without suspicious lesions or rashes ; no jaundice or tenting  Lymphatic: No lymphadenopathy is noted about the head, neck, axilla             Assessment & Plan:  #1 abdominal pain; exacerbation of reflux/hiatal hernia suggested. Past medical history of peptic ulcer. He has history of diverticulosis; clinically this is not diverticulitis  #2 muscular neck pain; he is not a candidate for nonsteroidals because of #1. Cervical pillow recommended. Massage therapy to be considered  Plan: See orders and recommendations

## 2012-05-31 NOTE — Patient Instructions (Addendum)
Please complete  stool cards . The triggers for  "heart burn"  include stress; the "aspirin family" ; alcohol; peppermint; and caffeine (coffee, tea, cola, and chocolate). The aspirin family would include aspirin and the nonsteroidal agents such as ibuprofen &  Naproxen. Tylenol would not cause reflux. If having symptoms. ; food & drink should be avoided for @ least 2 hours before going to bed.  Use a cervical memory foam pillow to prevent hyperextension or hyperflexion of the cervical spine. Massage therapy for the upper back and neck muscles would be of benefit and would be medically indicated  Please  schedule fasting Labs if pain recurs : BMET,Lipids, hepatic panel, CBC & dif, amylase, lipase.

## 2012-06-11 ENCOUNTER — Other Ambulatory Visit (INDEPENDENT_AMBULATORY_CARE_PROVIDER_SITE_OTHER): Payer: Medicare Other

## 2012-06-11 DIAGNOSIS — E782 Mixed hyperlipidemia: Secondary | ICD-10-CM

## 2012-06-11 LAB — HEPATIC FUNCTION PANEL
AST: 19 U/L (ref 0–37)
Alkaline Phosphatase: 79 U/L (ref 39–117)
Total Bilirubin: 0.8 mg/dL (ref 0.3–1.2)

## 2012-06-11 LAB — LIPID PANEL
Total CHOL/HDL Ratio: 5
Triglycerides: 169 mg/dL — ABNORMAL HIGH (ref 0.0–149.0)

## 2012-06-21 ENCOUNTER — Other Ambulatory Visit: Payer: Medicare Other

## 2012-06-21 ENCOUNTER — Other Ambulatory Visit: Payer: Self-pay | Admitting: *Deleted

## 2012-06-21 DIAGNOSIS — E782 Mixed hyperlipidemia: Secondary | ICD-10-CM

## 2012-06-21 DIAGNOSIS — Z79899 Other long term (current) drug therapy: Secondary | ICD-10-CM

## 2012-06-21 MED ORDER — PRAVASTATIN SODIUM 80 MG PO TABS: 80.0000 mg | ORAL_TABLET | Freq: Every evening | ORAL | Status: DC

## 2012-06-24 ENCOUNTER — Other Ambulatory Visit (INDEPENDENT_AMBULATORY_CARE_PROVIDER_SITE_OTHER): Payer: Medicare Other

## 2012-06-24 DIAGNOSIS — Z1289 Encounter for screening for malignant neoplasm of other sites: Secondary | ICD-10-CM

## 2012-06-24 DIAGNOSIS — Z0000 Encounter for general adult medical examination without abnormal findings: Secondary | ICD-10-CM

## 2012-06-25 ENCOUNTER — Other Ambulatory Visit: Payer: Self-pay

## 2012-06-25 ENCOUNTER — Telehealth: Payer: Self-pay | Admitting: Cardiology

## 2012-06-25 DIAGNOSIS — E782 Mixed hyperlipidemia: Secondary | ICD-10-CM

## 2012-06-25 MED ORDER — PRAVASTATIN SODIUM 40 MG PO TABS: 40.0000 mg | ORAL_TABLET | Freq: Every day | ORAL | Status: DC

## 2012-06-25 MED ORDER — PRAVASTATIN SODIUM 40 MG PO TABS: ORAL_TABLET | ORAL | Status: DC

## 2012-06-25 MED ORDER — PRAVASTATIN SODIUM 80 MG PO TABS: 80.0000 mg | ORAL_TABLET | Freq: Every evening | ORAL | Status: DC

## 2012-06-25 NOTE — Telephone Encounter (Signed)
Pt needs a new Rx written for pravastatin 40 mg take 2 pills once qd because it doesn't come in 80 [facility] not [facility] because it is too expensive.

## 2012-07-28 ENCOUNTER — Ambulatory Visit: Payer: Medicare Other | Admitting: Cardiovascular Disease

## 2012-09-13 ENCOUNTER — Other Ambulatory Visit (INDEPENDENT_AMBULATORY_CARE_PROVIDER_SITE_OTHER): Payer: Medicare Other

## 2012-09-13 DIAGNOSIS — E782 Mixed hyperlipidemia: Secondary | ICD-10-CM

## 2012-09-13 DIAGNOSIS — Z79899 Other long term (current) drug therapy: Secondary | ICD-10-CM

## 2012-09-13 LAB — HEPATIC FUNCTION PANEL
ALT: 21 U/L (ref 0–53)
Albumin: 4.2 g/dL (ref 3.5–5.2)
Total Bilirubin: 1.1 mg/dL (ref 0.3–1.2)

## 2012-09-13 LAB — LIPID PANEL
HDL: 43.6 mg/dL (ref >39.00)
Total CHOL/HDL Ratio: 4
Triglycerides: 97 mg/dL (ref 0.0–149.0)
VLDL: 19.4 mg/dL (ref 0.0–40.0)

## 2012-09-30 ENCOUNTER — Encounter: Payer: Self-pay | Admitting: *Deleted

## 2012-11-09 ENCOUNTER — Telehealth: Payer: Self-pay | Admitting: Cardiology

## 2012-11-09 DIAGNOSIS — Z79899 Other long term (current) drug therapy: Secondary | ICD-10-CM

## 2012-11-09 DIAGNOSIS — E7800 Pure hypercholesterolemia, unspecified: Secondary | ICD-10-CM

## 2012-11-09 NOTE — Telephone Encounter (Signed)
Follow Up     Pt called back requesting for nurse to call him on his cell.

## 2012-11-09 NOTE — Telephone Encounter (Signed)
Pt was confused about instructions he received about his cholesterol results - medication changes and when to follow up.  Advised he is to continue crestor as ordered and has fasting lipid and liver profile around June 9.  He states understanding.  An order will be placed and an appt made.

## 2012-11-09 NOTE — Telephone Encounter (Signed)
New Prob     Pt wanting to know how to move forward after new prescription of CRESTOR. Please call.

## 2012-11-22 ENCOUNTER — Other Ambulatory Visit (INDEPENDENT_AMBULATORY_CARE_PROVIDER_SITE_OTHER): Payer: Medicare Other

## 2012-11-22 DIAGNOSIS — E7800 Pure hypercholesterolemia, unspecified: Secondary | ICD-10-CM

## 2012-11-22 DIAGNOSIS — E782 Mixed hyperlipidemia: Secondary | ICD-10-CM

## 2012-11-22 DIAGNOSIS — Z79899 Other long term (current) drug therapy: Secondary | ICD-10-CM

## 2012-11-22 LAB — HEPATIC FUNCTION PANEL
Albumin: 4.1 g/dL (ref 3.5–5.2)
Bilirubin, Direct: 0.1 mg/dL (ref 0.0–0.3)
Total Protein: 7.7 g/dL (ref 6.0–8.3)

## 2012-11-22 LAB — LIPID PANEL
Cholesterol: 128 mg/dL (ref 0–200)
LDL Cholesterol: 62 mg/dL (ref 0–99)
Triglycerides: 108 mg/dL (ref 0.0–149.0)
VLDL: 21.6 mg/dL (ref 0.0–40.0)

## 2012-11-23 ENCOUNTER — Encounter: Payer: Self-pay | Admitting: *Deleted

## 2012-11-25 ENCOUNTER — Telehealth: Payer: Self-pay | Admitting: Cardiology

## 2012-11-25 NOTE — Telephone Encounter (Signed)
Spoke with patient about recent lab results 

## 2012-11-25 NOTE — Telephone Encounter (Signed)
New Problem  Pt is wanting the results of his cholesterol test.

## 2012-11-26 ENCOUNTER — Telehealth: Payer: Self-pay | Admitting: Cardiology

## 2012-11-26 MED ORDER — ROSUVASTATIN CALCIUM 20 MG PO TABS: 20.0000 mg | ORAL_TABLET | Freq: Every day | ORAL | Status: DC

## 2012-11-26 NOTE — Telephone Encounter (Signed)
Follow Up ° ° ° ° °Pt calling in following up on test results. Please call. °

## 2012-11-26 NOTE — Telephone Encounter (Signed)
Pt calling because he received the results of his cholesterol testing and needs a RX for his Crestor 20 .  RX sent into pharmacy as requested

## 2012-11-30 ENCOUNTER — Telehealth: Payer: Self-pay | Admitting: Cardiology

## 2012-11-30 ENCOUNTER — Other Ambulatory Visit: Payer: Self-pay | Admitting: *Deleted

## 2012-11-30 MED ORDER — PRAVASTATIN SODIUM 40 MG PO TABS: 40.0000 mg | ORAL_TABLET | Freq: Every evening | ORAL | Status: DC

## 2012-11-30 NOTE — Telephone Encounter (Signed)
Pt can not afford crestor and will continue pravastatin

## 2012-11-30 NOTE — Telephone Encounter (Signed)
New problem   Pt has question about cholesterol medication-crestor

## 2012-12-06 ENCOUNTER — Telehealth: Payer: Self-pay | Admitting: Cardiology

## 2012-12-06 NOTE — Telephone Encounter (Signed)
Left message for pt to call back to discuss questions

## 2012-12-06 NOTE — Telephone Encounter (Signed)
New Problem  Pt wants to speak with you regarding a test and some guidelines he needs to do. He also wants to talk about some results on using the medication CRESTOR.

## 2012-12-07 ENCOUNTER — Telehealth: Payer: Self-pay | Admitting: Cardiology

## 2012-12-07 NOTE — Telephone Encounter (Signed)
New Problem  Pt wants to speak with someone regarding a test and some guidelines he needs to do. He also wants to talk about some results on using the medication CRESTOR

## 2012-12-07 NOTE — Telephone Encounter (Signed)
The patient has had many phone calls about his lipid meds.  Ask him if he would be willing to come in to speak with our pharmacist for a lipid clinic appt.

## 2012-12-07 NOTE — Telephone Encounter (Signed)
Spoke with patient who had questions regarding his cholesterol medication.  Patient would like to know what Dr. Antoine Poche thinks is the best medication for him regardless of cost.  Patient is willing to change medications if Dr. Antoine Poche feels that one would be better than Pravastatin to improve the quality of his life. I advised patient that I would send this message to Dr. Antoine Poche and that when he is able to respond that we will call the patient back.  Patient verbalized understanding and agreement with this plan.

## 2012-12-07 NOTE — Telephone Encounter (Signed)
Spoke with pt. He declined Lipid Clinic appt.  Issue with cost of Crestor is that he has not met his deductible yet.  Once he meets this, the cost will go down.  Discussed lab results on Crestor versus pravastatin.  Explained to patient that we had no way to predict if there would be a difference in quality of life between the two; just explained that Crestor did a better job at preventing additional events than pravastatin.  He is going to think about what he wants to do.  If he does change back to pravastatin, he will let us know because he wants to recheck his labs a few months later.

## 2012-12-08 NOTE — Telephone Encounter (Signed)
Please see the other telephone note - Weston Brass discussed information r/t Crestor vs pravastatin.  Pt will call back once he decided what he wants to do.

## 2013-01-07 ENCOUNTER — Telehealth: Payer: Self-pay | Admitting: Cardiology

## 2013-01-07 NOTE — Telephone Encounter (Signed)
Spoke with patient who called to inquire as to whether a Rx assistance form he brought for Dr. Antoine Poche to fill out has been completed.  Patient states he left the form at the front desk for Pam and instructed that he would pick it up once it was completed.  Patient already aware that Pam and Dr. Antoine Poche are out of the office today.  I advised patient that I would send message to Dr. Antoine Poche to find out if he has completed the form as I was unable to locate the form myself.  Patient verbalized understanding.

## 2013-01-07 NOTE — Telephone Encounter (Signed)
I advised patient that Dr. Antoine Poche replied that he does not have the form and that I will f/u with Avie Arenas, RN next week and will call the patient back. Patient verbalized understanding and gratitude.

## 2013-01-07 NOTE — Telephone Encounter (Signed)
Follow Up     Pt is following up on a crestor program he needs a sign off for. Please call.

## 2013-01-07 NOTE — Telephone Encounter (Signed)
I have not seen this form.  

## 2013-01-10 NOTE — Telephone Encounter (Signed)
I completed form and placed at front desk.  I advised patient that Shanice at check-in has the form and that when he comes in tomorrow that she will fax the patient's information for him.  Patient verbalized understanding and gratitude.

## 2013-01-12 NOTE — Telephone Encounter (Deleted)
error 

## 2013-01-19 ENCOUNTER — Other Ambulatory Visit: Payer: Self-pay

## 2013-01-24 ENCOUNTER — Other Ambulatory Visit: Payer: Self-pay | Admitting: Cardiology

## 2013-04-19 ENCOUNTER — Ambulatory Visit (INDEPENDENT_AMBULATORY_CARE_PROVIDER_SITE_OTHER): Payer: Medicare Other | Admitting: Family Medicine

## 2013-04-19 ENCOUNTER — Encounter: Payer: Self-pay | Admitting: Family Medicine

## 2013-04-19 VITALS — BP 113/70 | HR 82 | Ht 65.0 in | Wt 155.0 lb

## 2013-04-19 DIAGNOSIS — M542 Cervicalgia: Secondary | ICD-10-CM

## 2013-04-19 DIAGNOSIS — M25511 Pain in right shoulder: Secondary | ICD-10-CM

## 2013-04-19 DIAGNOSIS — M25519 Pain in unspecified shoulder: Secondary | ICD-10-CM

## 2013-04-19 NOTE — Patient Instructions (Signed)
You have trapezius, strain/spasms (and other muscles in this area) possibly related to an irritated nerve from your neck. Start physical therapy for stretches, strengthening and modalities. Do exercises at home on days you don't go to therapy. Ibuprofen 600mg  three times a day with food OR aleve 2 tabs twice a day with food for pain and inflammation. Follow up with me in 6 weeks for reevaluation.

## 2013-04-21 ENCOUNTER — Encounter: Payer: Self-pay | Admitting: Family Medicine

## 2013-04-21 ENCOUNTER — Other Ambulatory Visit: Payer: Self-pay

## 2013-04-21 DIAGNOSIS — M542 Cervicalgia: Secondary | ICD-10-CM | POA: Insufficient documentation

## 2013-04-21 NOTE — Assessment & Plan Note (Signed)
consistent with trapezius/cervical paraspinal muscle spasms - suspect due to radiculopathic source.  Start with PT, nsaids, home exercise program.  F/u in 6 weeks for reevaluation.

## 2013-04-21 NOTE — Progress Notes (Signed)
Patient ID: [name], male   DOB: September 21, 1934, 78 y.o.   MRN: [id number]  PCP: Marga Melnick, MD  Subjective:   HPI: Patient is a 78 y.o. male here for right shoulder/neck pain.  Patient has prior history of cervical spine surgery. Reports over past 3 months he has had off and on pain posterior right shoulder and neck. Worse lying on right side. Better when he works it out, massages the area. Some numbness and tingling from shoulder to neck. Right handed. No prior shoulder issues. No bowel/bladder dysfunction.  Past Medical History  Diagnosis Date  . Hyperlipidemia   . Hypertension   . Diverticulosis   . Gilbert's syndrome   . Osteopenia 2010     T score -1.9 @ hip (femoral neck)  . CAD (coronary artery disease)     Dr Antoine Poche  . PUD (peptic ulcer disease)     PMH , ? 1995    Current Outpatient Prescriptions on File Prior to Visit  Medication Sig Dispense Refill  . aspirin 81 MG tablet Take 81 mg by mouth daily.        . Calcium Carbonate (CALCIUM 500 PO) Take by mouth daily.        . Cholecalciferol (VITAMIN D-3 PO) Take 2,000 Units by mouth daily.      [name] esomeprazole (NEXIUM) 40 MG capsule Take 1 capsule (40 mg total) by mouth daily.  20 capsule  0  . fish oil-omega-3 fatty acids 1000 MG capsule Take 1,400 g by mouth daily.      . Loratadine (CLARITIN PO) Take by mouth as needed.       . metoprolol tartrate (LOPRESSOR) 25 MG tablet Take half tablet by mouth twice daily  90 tablet  2  . Multiple Vitamin (MULTIVITAMIN PO) Take 1 tablet by mouth daily.       . nitroGLYCERIN (NITROSTAT) 0.4 MG SL tablet Place 1 tablet (0.4 mg total) under the tongue every 5 (five) minutes as needed.  25 tablet  11  . pravastatin (PRAVACHOL) 40 MG tablet Take 1 tablet (40 mg total) by mouth every evening.  90 tablet  3  . ranitidine (ZANTAC) 150 MG capsule Take 150 mg by mouth as needed.        No current facility-administered medications on file prior to visit.    Past Surgical  History  Procedure Laterality Date  . Hemorrhoid surgery    . Cholecystectomy  2004  . Cervical fusion  1997    Dr Jeral Fruit  . Coronary artery bypass graft  2003    Last catheterization was in August 2008 demonstrated a LIMA to th LAD which was patent, there was an atretic saphenous vein graft to the diagonal, the LAD had a 90% stenosis in the large calcified segment, the diagonal has ostial 70% stenosis, the circumflex had a ramus intermediate with ostial 25% stenosis, the right coronary artery was dominant.  There was an ulcerated 80%-90% stenosis.    . Tonsillectomy and adenoidectomy    . Coronary stent placement  2008    drug-eluting stent to right coronary artery  . Upper gastrointestinal endoscopy       Dr Victorino Dike  . Colonoscopy      dr Victorino Dike    Allergies  Allergen Reactions  . Codeine Nausea Only    History   Social History  . Marital Status: Married    Spouse Name: N/A    Number of Children: N/A  . Years of Education:  N/A   Occupational History  . Not on file.   Social History Main Topics  . Smoking status: Former Smoker    Quit date: 06/16/1978  . Smokeless tobacco: Never Used  . Alcohol Use: 3.5 oz/week    7 drink(s) per week  . Drug Use: No  . Sexual Activity: Not on file   Other Topics Concern  . Not on file   Social History Narrative   Walks once a week for exercise    Family History  Problem Relation Age of Onset  . Cancer Father     ? primary  . Uterine cancer Daughter   . Hypertension Maternal Grandmother   . Osteoporosis Sister   . Diabetes Sister   . Stroke Neg Hx   . Peripheral vascular disease Maternal Grandmother     BP 113/70  Pulse 82  Ht 5\' 5"  (1.651 m)  Wt 155 lb (70.308 kg)  BMI 25.79 kg/m2  Review of Systems: See HPI above.    Objective:  Physical Exam:  Gen: NAD  Neck: No gross deformity, swelling, bruising.  Spasms right trapezius. TTP right trapezius.  No midline/bony TTP. FROM neck - pain with right  lateral rotation. BUE strength 5/5.   Sensation intact to light touch.   2+ equal reflexes in triceps, biceps, brachioradialis tendons. Negative spurlings. NV intact distal BUEs.  R shoulder: No swelling, ecchymoses.  No gross deformity. No TTP. FROM. Negative Hawkins, Neers. Negative Speeds, Yergasons. Strength 5/5 with empty can and resisted internal/external rotation. Negative apprehension. NV intact distally.    Assessment & Plan:  1. Right neck/shoulder pain - consistent with trapezius/cervical paraspinal muscle spasms - suspect due to radiculopathic source.  Start with PT, nsaids, home exercise program.  F/u in 6 weeks for reevaluation.

## 2013-04-28 ENCOUNTER — Ambulatory Visit (INDEPENDENT_AMBULATORY_CARE_PROVIDER_SITE_OTHER): Payer: Medicare Other | Admitting: Cardiology

## 2013-04-28 ENCOUNTER — Encounter: Payer: Self-pay | Admitting: Cardiology

## 2013-04-28 VITALS — BP 102/64 | HR 66 | Ht 65.0 in | Wt 158.0 lb

## 2013-04-28 DIAGNOSIS — I2510 Atherosclerotic heart disease of native coronary artery without angina pectoris: Secondary | ICD-10-CM

## 2013-04-28 NOTE — Patient Instructions (Addendum)
The current medical regimen is effective;  continue present plan and medications.  Follow up in 1 year with Dr Hochrein.  You will receive a letter in the mail 2 months before you are due.  Please call us when you receive this letter to schedule your follow up appointment.  

## 2013-04-28 NOTE — Progress Notes (Signed)
HPI The patient presents for follow up of CAD.  Since I last saw him we tried to manage him with Pravachol he really didn't tolerate this. He had some muscle aches. He's doing much better on Crestor. His last lipids were excellent. He's been active taking care of his wife who had a knee infection as had some other health problems. He's doing a lot of chores.  With his "running around" he's not having any symptoms. The patient denies any new symptoms such as chest discomfort, neck or arm discomfort. There has been no new shortness of breath, PND or orthopnea. There have been no reported palpitations.  However, he did have an episode of syncope while he was out of the country and staying in the hospital with his wife. He stood up quickly early in the morning and passed out. He injured his neck but was evaluated and there was no trauma. He's had some soreness in his neck since then. He still is describing some occasional orthostasis particularly in the morning.  Allergies  Allergen Reactions  . Codeine Nausea Only    Current Outpatient Prescriptions  Medication Sig Dispense Refill  . aspirin 81 MG tablet Take 81 mg by mouth daily.        . Loratadine (CLARITIN PO) Take by mouth as needed.       . metoprolol tartrate (LOPRESSOR) 25 MG tablet Take half tablet by mouth twice daily  90 tablet  2  . Multiple Vitamin (MULTIVITAMIN PO) Take 1 tablet by mouth daily.       . nitroGLYCERIN (NITROSTAT) 0.4 MG SL tablet Place 1 tablet (0.4 mg total) under the tongue every 5 (five) minutes as needed.  25 tablet  11  . rosuvastatin (CRESTOR) 20 MG tablet Take 20 mg by mouth daily.      . Calcium Carbonate (CALCIUM 500 PO) Take by mouth daily.        . Cholecalciferol (VITAMIN D-3 PO) Take 2,000 Units by mouth daily.      [name] esomeprazole (NEXIUM) 40 MG capsule Take 1 capsule (40 mg total) by mouth daily.  20 capsule  0  . fish oil-omega-3 fatty acids 1000 MG capsule Take 1,400 g by mouth daily.      .  ranitidine (ZANTAC) 150 MG capsule Take 150 mg by mouth as needed.        No current facility-administered medications for this visit.    Past Medical History  Diagnosis Date  . Hyperlipidemia   . Hypertension   . Diverticulosis   . Gilbert's syndrome   . Osteopenia 2010     T score -1.9 @ hip (femoral neck)  . CAD (coronary artery disease)     Dr Antoine Poche  . PUD (peptic ulcer disease)     PMH , ? 1995    Past Surgical History  Procedure Laterality Date  . Hemorrhoid surgery    . Cholecystectomy  2004  . Cervical fusion  1997    Dr Jeral Fruit  . Coronary artery bypass graft  2003    Last catheterization was in August 2008 demonstrated a LIMA to th LAD which was patent, there was an atretic saphenous vein graft to the diagonal, the LAD had a 90% stenosis in the large calcified segment, the diagonal has ostial 70% stenosis, the circumflex had a ramus intermediate with ostial 25% stenosis, the right coronary artery was dominant.  There was an ulcerated 80%-90% stenosis.    . Tonsillectomy and adenoidectomy    .  Coronary stent placement  2008    drug-eluting stent to right coronary artery  . Upper gastrointestinal endoscopy       Dr Victorino Dike  . Colonoscopy      dr Doreatha Martin [facility]    ROS:  As stated in the HPI and negative for all other systems.   PHYSICAL EXAM BP 122/76  Pulse 66  Ht 5\' 5"  (1.651 m)  Wt 158 lb (71.668 kg)  BMI 26.29 kg/m2 GENERAL:  Well appearing NECK:  No jugular venous distention, waveform within normal limits, carotid upstroke brisk and symmetric, no bruits, no thyromegaly LUNGS:  Clear to auscultation bilaterally BACK:  No CVA tenderness CHEST:  Well healed sternotomy scar. HEART:  PMI not displaced or sustained,S1 and S2 within normal limits, no S3, no S4, no clicks, no rubs, apical systolic early peaking murmur without radiation, no diastolic murmursmurmurs ABD:  Flat, positive bowel sounds normal in frequency in pitch, no bruits, no rebound, no  guarding, no midline pulsatile mass, no hepatomegaly, no splenomegaly EXT:  2 plus pulses throughout, no edema, no cyanosis no clubbing  EKG:  Sinus rhythm, rate 66, axis within normal limits, intervals within normal limits, no acute ST-T wave changes.  04/28/2013   ASSESSMENT AND PLAN  CORONARY ARTERY DISEASE -  The patient has had no new symptoms since his stress perfusion study last year. He will continue with risk reduction.   HYPERTENSION, ESSENTIAL - The blood pressure is at target. No change in medications is indicated. We will continue with therapeutic lifestyle changes (TLC).  HYPERLIPIDEMIA - He Now seems to be doing very well on Crestor and he will remain on this.  DIZZINESS:   He seems to be describing orthostasis but did not have a significant drop in the office. We did discuss precautions. No change in therapy or further imaging is planned.  MURMUR: I suspect some mild aortic sclerosis. No further imaging is plan.

## 2013-05-31 ENCOUNTER — Ambulatory Visit: Payer: Medicare Other | Admitting: Family Medicine

## 2013-08-01 ENCOUNTER — Telehealth: Payer: Self-pay | Admitting: *Deleted

## 2013-08-01 NOTE — Telephone Encounter (Signed)
Patient assistance form for [place] for crestor completed and faxed, originals back to patient at his request.

## 2013-08-04 ENCOUNTER — Telehealth: Payer: Self-pay | Admitting: *Deleted

## 2013-08-04 NOTE — Telephone Encounter (Signed)
[facility] care [facility]

## 2013-10-13 ENCOUNTER — Encounter: Payer: Self-pay | Admitting: Internal Medicine

## 2013-10-13 ENCOUNTER — Ambulatory Visit (INDEPENDENT_AMBULATORY_CARE_PROVIDER_SITE_OTHER): Payer: Medicare Other | Admitting: Internal Medicine

## 2013-10-13 VITALS — BP 114/74 | HR 74 | Temp 98.0°F | Resp 12 | Ht 64.0 in | Wt 157.8 lb

## 2013-10-13 DIAGNOSIS — E782 Mixed hyperlipidemia: Secondary | ICD-10-CM

## 2013-10-13 DIAGNOSIS — R0989 Other specified symptoms and signs involving the circulatory and respiratory systems: Secondary | ICD-10-CM

## 2013-10-13 DIAGNOSIS — R0600 Dyspnea, unspecified: Secondary | ICD-10-CM

## 2013-10-13 DIAGNOSIS — I10 Essential (primary) hypertension: Secondary | ICD-10-CM

## 2013-10-13 DIAGNOSIS — R5383 Other fatigue: Secondary | ICD-10-CM

## 2013-10-13 DIAGNOSIS — R5381 Other malaise: Secondary | ICD-10-CM

## 2013-10-13 DIAGNOSIS — R0609 Other forms of dyspnea: Secondary | ICD-10-CM

## 2013-10-13 DIAGNOSIS — Z0000 Encounter for general adult medical examination without abnormal findings: Secondary | ICD-10-CM

## 2013-10-13 MED ORDER — NITROGLYCERIN 0.4 MG SL SUBL: 0.4000 mg | SUBLINGUAL_TABLET | SUBLINGUAL | Status: DC | PRN

## 2013-10-13 NOTE — Progress Notes (Signed)
Pre visit review using our clinic review tool, if applicable. No additional management support is needed unless otherwise documented below in the visit note. 

## 2013-10-13 NOTE — Progress Notes (Signed)
Subjective:    Patient ID: [name], male    DOB: 02/26/1935, 79 y.o.   MRN: [id number]  HPI Medicare Wellness Visit: Psychosocial and medical history were reviewed as required by Medicare (history related to abuse, antisocial behavior , firearm risk). Social history: Caffeine: 2 cups/ day , Alcohol: 7 glasses/ week , Tobacco use: quit 1989 Exercise:no Personal safety/fall risk:no Limitations of activities of daily living:no Seatbelt/ smoke alarm use:yes Healthcare Power of Attorney/Living Will status: not UTD Ophthalmologic exam status:UTD Hearing evaluation status:not UTD Orientation: Oriented X 3 Memory and recall: yes Spelling or math testing: yes Depression/anxiety assessment: no Foreign travel history: [place] 10/14 Immunization status for influenza/pneumonia/ shingles /tetanus: no Flu shot; risks discussed Transfusion history:no Preventive health care maintenance status: Colonoscopy as per protocol/standard care:not UTD Dental care:not UTD Chart reviewed and updated. Active issues reviewed and addressed as documented below.  A heart healthy diet is followed; exercise is minimal due to fatigue. With documented CAD his  LDL goal is less than 70 . There is medication compliance with the statin.  Low dose ASA taken Review of Systems  Specifically denied are  chest pain, palpitations, or claudication. DOE after 1 block , progressive since 05/2013.As noted, no regular exercise. Significant abdominal symptoms, memory deficit, or myalgias not present. He describes unexplained weight loss of 10 # in past 12 months. He denies abdominal pain, significant dyspepsia, dysphagia, melena,or rectal bleeding. He denies blurred vision, double vision, or loss of vision. There's been no change in his hair, skin, nails. He has no constipation or diarrhea of significance. He also denies temperature intolerance to heat or cold. No sleep apnea or severe snoring.      Physical Exam   Gen.: Healthy and well-nourished in appearance. Alert, appropriate and cooperative throughout exam. Appears younger than stated age  Head: Normocephalic without obvious abnormalities  Eyes: No corneal or conjunctival inflammation noted. Pupils equal round reactive to light and accommodation. Extraocular motion intact.  Ears: External  ear exam reveals no significant lesions or deformities. Canals clear .TMs normal. Hearing is grossly normal bilaterally. Nose: External nasal exam reveals no deformity or inflammation. Nasal mucosa are pink and moist. No lesions or exudates noted.   Mouth: Oral mucosa and oropharynx reveal no lesions or exudates. Teeth in good repair. Neck: No deformities, masses, or tenderness noted. Range of motion decreased. Thyroid normal. Lungs: Normal respiratory effort; chest expands symmetrically. Lungs are clear to auscultation without rales, wheezes, or increased work of breathing. Heart: Normal rate and rhythm. Normal S1 and S2. No gallop, click, or rub. No murmur. Abdomen: Bowel sounds normal; abdomen soft and nontender. No masses, organomegaly or hernias noted. Genitalia:  MALE:Genitalia normal except for left varices. Prostate is normal without enlargement, asymmetry, nodularity, or induration                               Musculoskeletal/extremities: No deformity or scoliosis noted of  the thoracic or lumbar spine.  No clubbing, cyanosis, edema, or significant extremity  deformity noted. Range of motion normal .Tone & strength normal. Hand joints normal  Fingernail  health good. Able to lie down & sit up w/o help. Negative SLR bilaterally Vascular: Carotid, radial artery, dorsalis pedis and  posterior tibial pulses are full and equal. No bruits present. Neurologic: Alert and oriented x3. Deep tendon reflexes symmetrical and normal.  Gait normal      Skin: Intact without suspicious lesions or rashes.  Lymph: No cervical, axillary, or inguinal lymphadenopathy  present. Psych: Mood and affect are normal. Normally interactive                                                                                        Assessment & Plan:  #1 Medicare Wellness Exam; criteria met ; data entered #2 Problem List/Diagnoses reviewed Plan:  Assessments made/ Orders entered The labs will be reviewed and risks and options assessed. Written recommendations will be provided by mail or directly through My Chart.Further evaluation or change in medical therapy will be directed by those results.

## 2013-10-13 NOTE — Patient Instructions (Signed)
Your next office appointment will be determined based upon review of your pending labs &/ or x-rays. Those instructions will be transmitted to you through My Chart  . 

## 2013-10-14 ENCOUNTER — Other Ambulatory Visit (INDEPENDENT_AMBULATORY_CARE_PROVIDER_SITE_OTHER): Payer: Medicare Other

## 2013-10-14 ENCOUNTER — Ambulatory Visit (INDEPENDENT_AMBULATORY_CARE_PROVIDER_SITE_OTHER)
Admission: RE | Admit: 2013-10-14 | Discharge: 2013-10-14 | Disposition: A | Discharge status: Home / Self Care | Payer: Medicare Other | Source: Ambulatory Visit | Attending: Internal Medicine | Admitting: Internal Medicine

## 2013-10-14 ENCOUNTER — Telehealth: Payer: Self-pay | Admitting: Internal Medicine

## 2013-10-14 DIAGNOSIS — I10 Essential (primary) hypertension: Secondary | ICD-10-CM

## 2013-10-14 DIAGNOSIS — R0609 Other forms of dyspnea: Secondary | ICD-10-CM

## 2013-10-14 DIAGNOSIS — R5381 Other malaise: Secondary | ICD-10-CM

## 2013-10-14 DIAGNOSIS — R0989 Other specified symptoms and signs involving the circulatory and respiratory systems: Secondary | ICD-10-CM

## 2013-10-14 DIAGNOSIS — R0600 Dyspnea, unspecified: Secondary | ICD-10-CM

## 2013-10-14 DIAGNOSIS — E782 Mixed hyperlipidemia: Secondary | ICD-10-CM

## 2013-10-14 DIAGNOSIS — R5383 Other fatigue: Secondary | ICD-10-CM

## 2013-10-14 LAB — BASIC METABOLIC PANEL
BUN: 18 mg/dL (ref 6–23)
CO2: 29 meq/L (ref 19–32)
Calcium: 9.7 mg/dL (ref 8.4–10.5)
Chloride: 102 mEq/L (ref 96–112)
Creatinine, Ser: 0.8 mg/dL (ref 0.4–1.5)
GFR: 104.28 mL/min (ref >60.00)
GLUCOSE: 89 mg/dL (ref 70–99)
POTASSIUM: 4.2 meq/L (ref 3.5–5.1)
Sodium: 139 mEq/L (ref 135–145)

## 2013-10-14 LAB — CBC WITH DIFFERENTIAL/PLATELET
Basophils Absolute: 0 10*3/uL (ref 0.0–0.1)
Basophils Relative: 0.4 % (ref 0.0–3.0)
Eosinophils Absolute: 0.7 10*3/uL (ref 0.0–0.7)
Eosinophils Relative: 11 % — ABNORMAL HIGH (ref 0.0–5.0)
HEMATOCRIT: 47.5 % (ref 39.0–52.0)
HEMOGLOBIN: 16.5 g/dL (ref 13.0–17.0)
LYMPHS ABS: 1.4 10*3/uL (ref 0.7–4.0)
Lymphocytes Relative: 20.6 % (ref 12.0–46.0)
MCHC: 34.8 g/dL (ref 30.0–36.0)
MCV: 88.4 fl (ref 78.0–100.0)
Monocytes Absolute: 0.5 10*3/uL (ref 0.1–1.0)
Monocytes Relative: 8.3 % (ref 3.0–12.0)
NEUTROS ABS: 3.9 10*3/uL (ref 1.4–7.7)
Neutrophils Relative %: 59.7 % (ref 43.0–77.0)
Platelets: 221 10*3/uL (ref 150.0–400.0)
RBC: 5.37 Mil/uL (ref 4.22–5.81)
RDW: 13.2 % (ref 11.5–14.6)
WBC: 6.6 10*3/uL (ref 4.5–10.5)

## 2013-10-14 LAB — HEPATIC FUNCTION PANEL
ALBUMIN: 4.6 g/dL (ref 3.5–5.2)
ALT: 21 U/L (ref 0–53)
AST: 20 U/L (ref 0–37)
Alkaline Phosphatase: 60 U/L (ref 39–117)
Bilirubin, Direct: 0.2 mg/dL (ref 0.0–0.3)
Total Bilirubin: 1.3 mg/dL — ABNORMAL HIGH (ref 0.3–1.2)
Total Protein: 7.7 g/dL (ref 6.0–8.3)

## 2013-10-14 LAB — LIPID PANEL
CHOLESTEROL: 134 mg/dL (ref 0–200)
HDL: 48.1 mg/dL (ref >39.00)
LDL Cholesterol: 72 mg/dL (ref 0–99)
Total CHOL/HDL Ratio: 3
Triglycerides: 69 mg/dL (ref 0.0–149.0)
VLDL: 13.8 mg/dL (ref 0.0–40.0)

## 2013-10-14 LAB — TSH: TSH: 1.2 u[IU]/mL (ref 0.35–5.50)

## 2013-10-14 NOTE — Telephone Encounter (Signed)
Relevant patient education assigned to patient using Emmi. ° °

## 2013-10-15 DIAGNOSIS — R0600 Dyspnea, unspecified: Secondary | ICD-10-CM | POA: Insufficient documentation

## 2013-10-15 DIAGNOSIS — R5383 Other fatigue: Secondary | ICD-10-CM

## 2013-10-15 DIAGNOSIS — R5381 Other malaise: Secondary | ICD-10-CM | POA: Insufficient documentation

## 2013-10-15 DIAGNOSIS — R0609 Other forms of dyspnea: Secondary | ICD-10-CM | POA: Insufficient documentation

## 2013-10-15 NOTE — Assessment & Plan Note (Signed)
Blood pressure goals reviewed. BMET 

## 2013-10-15 NOTE — Assessment & Plan Note (Signed)
Lipids, LFTs, TSH  

## 2013-10-15 NOTE — Progress Notes (Signed)
   Subjective:    Patient ID: Matthew LopesJuan A [name], male    DOB: [birth year], 79 y.o.   MRN: [id number]  HPI    Review of Systems     Objective:   Physical Exam Corrections /additions to PE which I had recorded on separate worksheet for inclusion in EMR : Arcus senilis bilaterally Upper dental plate & lower partial TMs dull  Slightly decreased hearing bilaterally Increased AP diameter of thorax Grade 1 systolic murmur Large granuloma R testicle & L hydrocele & varicocele Hemorrhoidal tags FOB negative       Assessment & Plan:

## 2013-10-15 NOTE — Assessment & Plan Note (Signed)
[facility] 

## 2013-10-15 NOTE — Assessment & Plan Note (Signed)
CBC & dif, BMET,hepatic panel,TSH

## 2013-10-21 ENCOUNTER — Telehealth: Payer: Self-pay

## 2013-10-21 NOTE — Telephone Encounter (Signed)
Patient called [facility] requesting lab results. Per Epic, mychart active and message read by patient, closing phone note.

## 2013-10-26 ENCOUNTER — Encounter (HOSPITAL_BASED_OUTPATIENT_CLINIC_OR_DEPARTMENT_OTHER): Payer: Self-pay | Admitting: Emergency Medicine

## 2013-10-26 ENCOUNTER — Emergency Department (HOSPITAL_BASED_OUTPATIENT_CLINIC_OR_DEPARTMENT_OTHER): Payer: Medicare Other

## 2013-10-26 ENCOUNTER — Emergency Department (HOSPITAL_BASED_OUTPATIENT_CLINIC_OR_DEPARTMENT_OTHER)
Admission: EM | Admit: 2013-10-26 | Discharge: 2013-10-26 | Disposition: A | Discharge status: Home / Self Care | Payer: Medicare Other | Attending: Emergency Medicine | Admitting: Emergency Medicine

## 2013-10-26 ENCOUNTER — Other Ambulatory Visit: Payer: Self-pay | Admitting: Cardiology

## 2013-10-26 DIAGNOSIS — Z8711 Personal history of peptic ulcer disease: Secondary | ICD-10-CM | POA: Insufficient documentation

## 2013-10-26 DIAGNOSIS — Z79899 Other long term (current) drug therapy: Secondary | ICD-10-CM | POA: Insufficient documentation

## 2013-10-26 DIAGNOSIS — Z8719 Personal history of other diseases of the digestive system: Secondary | ICD-10-CM | POA: Insufficient documentation

## 2013-10-26 DIAGNOSIS — R42 Dizziness and giddiness: Secondary | ICD-10-CM | POA: Insufficient documentation

## 2013-10-26 DIAGNOSIS — E785 Hyperlipidemia, unspecified: Secondary | ICD-10-CM | POA: Insufficient documentation

## 2013-10-26 DIAGNOSIS — Z87891 Personal history of nicotine dependence: Secondary | ICD-10-CM | POA: Insufficient documentation

## 2013-10-26 DIAGNOSIS — M949 Disorder of cartilage, unspecified: Secondary | ICD-10-CM

## 2013-10-26 DIAGNOSIS — Z7982 Long term (current) use of aspirin: Secondary | ICD-10-CM | POA: Insufficient documentation

## 2013-10-26 DIAGNOSIS — I2510 Atherosclerotic heart disease of native coronary artery without angina pectoris: Secondary | ICD-10-CM | POA: Insufficient documentation

## 2013-10-26 DIAGNOSIS — H81399 Other peripheral vertigo, unspecified ear: Secondary | ICD-10-CM

## 2013-10-26 DIAGNOSIS — Z951 Presence of aortocoronary bypass graft: Secondary | ICD-10-CM | POA: Insufficient documentation

## 2013-10-26 DIAGNOSIS — I10 Essential (primary) hypertension: Secondary | ICD-10-CM | POA: Insufficient documentation

## 2013-10-26 DIAGNOSIS — M899 Disorder of bone, unspecified: Secondary | ICD-10-CM | POA: Insufficient documentation

## 2013-10-26 LAB — BASIC METABOLIC PANEL
BUN: 19 mg/dL (ref 6–23)
CHLORIDE: 103 meq/L (ref 96–112)
CO2: 26 mEq/L (ref 19–32)
CREATININE: 0.8 mg/dL (ref 0.50–1.35)
Calcium: 10.5 mg/dL (ref 8.4–10.5)
GFR calc non Af Amer: 85 mL/min — ABNORMAL LOW (ref >90)
Glucose, Bld: 127 mg/dL — ABNORMAL HIGH (ref 70–99)
Potassium: 3.9 mEq/L (ref 3.7–5.3)
Sodium: 143 mEq/L (ref 137–147)

## 2013-10-26 LAB — CBC WITH DIFFERENTIAL/PLATELET
BASOS ABS: 0 10*3/uL (ref 0.0–0.1)
BASOS PCT: 0 % (ref 0–1)
Eosinophils Absolute: 0.5 10*3/uL (ref 0.0–0.7)
Eosinophils Relative: 7 % — ABNORMAL HIGH (ref 0–5)
HEMATOCRIT: 46.6 % (ref 39.0–52.0)
Hemoglobin: 16.8 g/dL (ref 13.0–17.0)
Lymphocytes Relative: 14 % (ref 12–46)
Lymphs Abs: 0.9 10*3/uL (ref 0.7–4.0)
MCH: 31.4 pg (ref 26.0–34.0)
MCHC: 36.1 g/dL — AB (ref 30.0–36.0)
MCV: 87.1 fL (ref 78.0–100.0)
MONO ABS: 0.5 10*3/uL (ref 0.1–1.0)
Monocytes Relative: 7 % (ref 3–12)
NEUTROS ABS: 4.8 10*3/uL (ref 1.7–7.7)
Neutrophils Relative %: 72 % (ref 43–77)
PLATELETS: 197 10*3/uL (ref 150–400)
RBC: 5.35 MIL/uL (ref 4.22–5.81)
RDW: 12.7 % (ref 11.5–15.5)
WBC: 6.8 10*3/uL (ref 4.0–10.5)

## 2013-10-26 LAB — TROPONIN I: Troponin I: <0.3 ng/mL (ref <0.30)

## 2013-10-26 MED ORDER — MECLIZINE HCL 25 MG PO TABS: 25.0000 mg | ORAL_TABLET | Freq: Three times a day (TID) | ORAL | Status: DC | PRN

## 2013-10-26 MED ORDER — MECLIZINE HCL 25 MG PO TABS
25.0000 mg | ORAL_TABLET | Freq: Once | ORAL | Status: AC
  Administered 2013-10-26: 25 mg via ORAL
  Filled 2013-10-26: qty 1

## 2013-10-26 MED ORDER — SODIUM CHLORIDE 0.9 % IV BOLUS (SEPSIS)
1000.0000 mL | Freq: Once | INTRAVENOUS | Status: AC
  Administered 2013-10-26: 1000 mL via INTRAVENOUS

## 2013-10-26 NOTE — ED Notes (Signed)
Patient transported to CT 

## 2013-10-26 NOTE — Discharge Instructions (Signed)
Meclizine as prescribed as needed for dizziness.  Return to the emergency department if you develop severe headache, high fever, or any other new and concerning symptoms.   Vertigo Vertigo means you feel like you or your surroundings are moving when they are not. Vertigo can be dangerous if it occurs when you are at work, driving, or performing difficult activities.  CAUSES  Vertigo occurs when there is a conflict of signals sent to your brain from the visual and sensory systems in your body. There are many different causes of vertigo, including:  Infections, especially in the inner ear.  A bad reaction to a drug or misuse of alcohol and medicines.  Withdrawal from drugs or alcohol.  Rapidly changing positions, such as lying down or rolling over in bed.  A migraine headache.  Decreased blood flow to the brain.  Increased pressure in the brain from a head injury, infection, tumor, or bleeding. SYMPTOMS  You may feel as though the world is spinning around or you are falling to the ground. Because your balance is upset, vertigo can cause nausea and vomiting. You may have involuntary eye movements (nystagmus). DIAGNOSIS  Vertigo is usually diagnosed by physical exam. If the cause of your vertigo is unknown, your caregiver may perform imaging tests, such as an MRI scan (magnetic resonance imaging). TREATMENT  Most cases of vertigo resolve on their own, without treatment. Depending on the cause, your caregiver may prescribe certain medicines. If your vertigo is related to body position issues, your caregiver may recommend movements or procedures to correct the problem. In rare cases, if your vertigo is caused by certain inner ear problems, you may need surgery. HOME CARE INSTRUCTIONS   Follow your caregiver's instructions.  Avoid driving.  Avoid operating heavy machinery.  Avoid performing any tasks that would be dangerous to you or others during a vertigo episode.  Tell your  caregiver if you notice that certain medicines seem to be causing your vertigo. Some of the medicines used to treat vertigo episodes can actually make them worse in some people. SEEK IMMEDIATE MEDICAL CARE IF:   Your medicines do not relieve your vertigo or are making it worse.  You develop problems with talking, walking, weakness, or using your arms, hands, or legs.  You develop severe headaches.  Your nausea or vomiting continues or gets worse.  You develop visual changes.  A family member notices behavioral changes.  Your condition gets worse. MAKE SURE YOU:  Understand these instructions.  Will watch your condition.  Will get help right away if you are not doing well or get worse. Document Released: 03/12/2005 Document Revised: 08/25/2011 Document Reviewed: 12/19/2010 [facility] Patient Information 2014 [place], [place].

## 2013-10-26 NOTE — ED Notes (Addendum)
Two days ago began to have dizziness and be lightheaded. Began taking a new medication for allergies, claritin, the night before symptoms began.

## 2013-10-26 NOTE — ED Provider Notes (Signed)
CSN: [id number]     Arrival date & time 10/26/13  [id number] History   First MD Initiated Contact with Patient 10/26/13 [phone]     Chief Complaint  Patient presents with  . Shortness of Breath     (Consider location/radiation/quality/duration/timing/severity/associated sxs/prior Treatment) HPI Comments: Patient is a 79 year old male with history of coronary artery disease status post CABG. He presents today with complaints of dizziness for the past 2 days. He states that this has been coming and going and is worse with position and turning his head. He denies any headache, fevers, chills. He was seen by his primary Dr. 2 weeks ago and was started on Zyrtec for possible allergies. His symptoms started shortly afterward and he stopped taking this medication. However he is not improved.  Patient is a 79 y.o. male presenting with shortness of breath. The history is provided by the patient.  Shortness of Breath Severity:  Moderate Onset quality:  Sudden Duration:  2 days Timing:  Intermittent Progression:  Worsening Chronicity:  New Context: activity   Relieved by:  Nothing Worsened by:  Nothing tried Ineffective treatments:  None tried   Past Medical History  Diagnosis Date  . Hyperlipidemia   . Hypertension   . Diverticulosis   . Gilbert's syndrome   . Osteopenia 2010     T score -1.9 @ hip (femoral neck)  . CAD (coronary artery disease)     Dr Antoine PocheHochrein  . PUD (peptic ulcer disease)     PMH , ? 1995   Past Surgical History  Procedure Laterality Date  . Hemorrhoid surgery    . Cholecystectomy  2004  . Cervical fusion  1997    Dr Jeral FruitBotero  . Coronary artery bypass graft  2003    Last catheterization was in August 2008 demonstrated a LIMA to th LAD which was patent, there was an atretic saphenous vein graft to the diagonal, the LAD had a 90% stenosis in the large calcified segment, the diagonal has ostial 70% stenosis, the circumflex had a ramus intermediate with ostial 25% stenosis,  the right coronary artery was dominant.  There was an ulcerated 80%-90% stenosis.    . Tonsillectomy and adenoidectomy    . Coronary stent placement  2008    drug-eluting stent to right coronary artery  . Upper gastrointestinal endoscopy       Dr Victorino DikeSam [place]  . Colonoscopy      Dr Victorino DikeSam [place]   Family History  Problem Relation Age of Onset  . Cancer Father     ? primary  . Uterine cancer Daughter   . Hypertension Maternal Grandmother   . Osteoporosis Sister   . Diabetes Sister   . Stroke Neg Hx   . Peripheral vascular disease Maternal Grandmother   . Heart attack Neg Hx    History  Substance Use Topics  . Smoking status: Former Smoker    Quit date: 06/16/1978  . Smokeless tobacco: Never Used     Comment: smoked 1955-1980, up to 1 ppd  . Alcohol Use: 3.5 oz/week    7 drink(s) per week    Review of Systems  Respiratory: Positive for shortness of breath.   All other systems reviewed and are negative.     Allergies  Codeine  Home Medications   Prior to Admission medications   Medication Sig Start Date End Date Taking? Authorizing Provider  aspirin 81 MG tablet Take 81 mg by mouth daily.      Historical Provider, MD  Loratadine (  CLARITIN PO) Take by mouth as needed.     Historical Provider, MD  metoprolol tartrate (LOPRESSOR) 25 MG tablet Take half tablet by mouth twice daily 01/24/13   Rollene RotundaJames Hochrein, MD  Multiple Vitamin (MULTIVITAMIN PO) Take 1 tablet by mouth daily.     Historical Provider, MD  nitroGLYCERIN (NITROSTAT) 0.4 MG SL tablet Place 1 tablet (0.4 mg total) under the tongue every 5 (five) minutes as needed. 10/13/13   Pecola LawlessWilliam F Hopper, MD  rosuvastatin (CRESTOR) 20 MG tablet Take 20 mg by mouth daily.    Historical Provider, MD   BP 151/77  Pulse 66  Resp 20  SpO2 100% Physical Exam  Nursing note and vitals reviewed. Constitutional: He is oriented to person, place, and time. He appears well-developed and well-nourished. No distress.  HENT:  Head:  Normocephalic and atraumatic.  Mouth/Throat: Oropharynx is clear and moist.  TMs are clear bilaterally.  Eyes: EOM are normal. Pupils are equal, round, and reactive to light.  Neck: Normal range of motion. Neck supple.  Cardiovascular: Normal rate, regular rhythm and normal heart sounds.   No murmur heard. Pulmonary/Chest: Effort normal and breath sounds normal. No respiratory distress.  Abdominal: Soft. Bowel sounds are normal. He exhibits no distension. There is no tenderness.  Musculoskeletal: Normal range of motion. He exhibits no edema.  Lymphadenopathy:    He has no cervical adenopathy.  Neurological: He is alert and oriented to person, place, and time. No cranial nerve deficit. He exhibits normal muscle tone. Coordination normal.  Skin: Skin is warm and dry. He is not diaphoretic.    ED Course  Procedures (including critical care time) Labs Review Labs Reviewed  CBC WITH DIFFERENTIAL  BASIC METABOLIC PANEL  TROPONIN I    Imaging Review No results found.   EKG Interpretation   Date/Time:  Wednesday Oct 26 2013 09:57:22 EDT Ventricular Rate:  61 PR Interval:  160 QRS Duration: 70 QT Interval:  410 QTC Calculation: 412 R Axis:   29 Text Interpretation:  Normal sinus rhythm Septal infarct , age  undetermined Abnormal ECG Confirmed by DELOS  MD, Jovanny Stephanie ([id number]) on  10/26/2013 11:13:14 AM      MDM   Final diagnoses:  None    Patient is a 79 year old male with history of coronary artery disease with bypass surgery in the past. He presents today with complaints of dizziness that sounds vertiginous in nature. His workup does not reveal any acute process. There is no sign of arrhythmia or cardiac ischemia. His neurologic exam is nonfocal and CT of the head is unremarkable. He is feeling better with meclizine and IV fluids and I suspect a peripheral vertigo. He will be discharged to home with meclizine and when necessary return.    Geoffery Lyonsouglas Letesha Klecker, MD 10/26/13 1114

## 2013-11-08 ENCOUNTER — Telehealth: Payer: Self-pay | Admitting: Cardiology

## 2013-11-08 NOTE — Telephone Encounter (Signed)
Called to follow up with pt - he states he is not having pain now and does not want to go to the ED at this point.  He was advised this is Dr Hochrein's recommendation however if he is not going to go he needs to make sure he keeps the SL Ntg with him at all times and use it should the pain return.  He states understanding and will use it and go to the hospital for eval if it returns and is not relieved with NTG.

## 2013-11-08 NOTE — Telephone Encounter (Signed)
Left detailed message on voicemail.  

## 2013-11-08 NOTE — Telephone Encounter (Signed)
Given the new onset of resting pain the patient should proceed to the ED for further evaluation.

## 2013-11-08 NOTE — Telephone Encounter (Signed)
Pt states he has been having back and left shoulder pain off and on since Friday.  The pain comes and goes on its own.  It does get worse with activity but goes away at rest.  He does states he can relate it to when he eat and gas but it also reminds him of when he had to have stents.  He does get SOB with the discomfort.  He has not used SL ntg.  He will get it filled and use it if he should have pain again.  Aware I will forward this information to Dr Antoine Poche for orders.

## 2013-11-08 NOTE — Telephone Encounter (Signed)
New problem   Pt had back shoulder and center of his back that comes and goes. Pt need to speak to nurse because he think it is heart related.

## 2013-11-22 ENCOUNTER — Other Ambulatory Visit (INDEPENDENT_AMBULATORY_CARE_PROVIDER_SITE_OTHER): Payer: Medicare Other

## 2013-11-22 ENCOUNTER — Telehealth: Payer: Self-pay | Admitting: Internal Medicine

## 2013-11-22 ENCOUNTER — Ambulatory Visit (INDEPENDENT_AMBULATORY_CARE_PROVIDER_SITE_OTHER)
Admission: RE | Admit: 2013-11-22 | Discharge: 2013-11-22 | Disposition: A | Discharge status: Home / Self Care | Payer: Medicare Other | Source: Ambulatory Visit | Attending: Internal Medicine | Admitting: Internal Medicine

## 2013-11-22 ENCOUNTER — Encounter: Payer: Self-pay | Admitting: Internal Medicine

## 2013-11-22 ENCOUNTER — Ambulatory Visit (INDEPENDENT_AMBULATORY_CARE_PROVIDER_SITE_OTHER): Payer: Medicare Other | Admitting: Internal Medicine

## 2013-11-22 VITALS — BP 140/88 | HR 73 | Temp 97.6°F | Wt 157.4 lb

## 2013-11-22 DIAGNOSIS — R1901 Right upper quadrant abdominal swelling, mass and lump: Secondary | ICD-10-CM

## 2013-11-22 DIAGNOSIS — M546 Pain in thoracic spine: Secondary | ICD-10-CM

## 2013-11-22 DIAGNOSIS — R0989 Other specified symptoms and signs involving the circulatory and respiratory systems: Secondary | ICD-10-CM

## 2013-11-22 DIAGNOSIS — IMO0001 Reserved for inherently not codable concepts without codable children: Secondary | ICD-10-CM

## 2013-11-22 DIAGNOSIS — R0600 Dyspnea, unspecified: Secondary | ICD-10-CM

## 2013-11-22 DIAGNOSIS — M791 Myalgia, unspecified site: Secondary | ICD-10-CM

## 2013-11-22 DIAGNOSIS — R0609 Other forms of dyspnea: Secondary | ICD-10-CM

## 2013-11-22 LAB — CK: Total CK: 132 U/L (ref 7–232)

## 2013-11-22 LAB — LIPASE: Lipase: 360 U/L — ABNORMAL HIGH (ref 11.0–59.0)

## 2013-11-22 LAB — ALT: ALT: 19 U/L (ref 0–53)

## 2013-11-22 LAB — SEDIMENTATION RATE: Sed Rate: 12 mm/hr (ref 0–22)

## 2013-11-22 LAB — AST: AST: 20 U/L (ref 0–37)

## 2013-11-22 LAB — AMYLASE: Amylase: 316 U/L — ABNORMAL HIGH (ref 27–131)

## 2013-11-22 NOTE — Progress Notes (Signed)
Pre visit review using our clinic review tool, if applicable. No additional management support is needed unless otherwise documented below in the visit note. 

## 2013-11-22 NOTE — Patient Instructions (Signed)
Your next office appointment will be determined based upon review of your pending labs & x-rays. Those instructions will be transmitted to you through My Chart .   I recommend a Physical Therapy consultation to determine optimal therapy if studies negative

## 2013-11-22 NOTE — Progress Notes (Signed)
   Subjective:    Patient ID: [name], male    DOB: [birth year], 79 y.o.   MRN: [id number]  HPI  Pt is here for many symptoms today. 1st problem and of most concern is pain in center of back. This began about two weeks ago, is located in upper back to the right of midline in the upper thoracic area. Characteristics include "pushing sensation" and sharp. It happens with increased activity, walking, going up or down stairs. It is 5-6/10 in severity and does not radiate. Rest relieves the pain and no treatments have been tried for this. No hx of spinal or shoulder surgeries. No loss of bladder, bowel, or weakness.   #2- SOB on exertion. This began within the last 30 days and with activity. He feels tired and has to rest to relieve this. He denies lightheadedness, dizziness, or coughing.   #3- Constipation- Hx of cholecystectomy. Pt describes intermittent pain that is mild in the RUQ. This began in early March. It does not radiate and improves with BM or flatus. He does have dyspepsia for which he takes tums in the evening with relief. He has no significant weight loss, but does report 9 lb loss over 12 mos.   Review of Systems Positive for abdominal pain in RUQ, significant dyspepsia for which he takes tums. Positive for fatigue, SOBE, numbness in lateral two fingers on right hand when driving, and muscle pain in upper right back, and right shoulder.   No significant weight loss, , dysphagia, melena, rectal bleeding, or persistently small caliber stools. No significant fever, chills, sweats, blurred vision, double vision, loss of vision, headaches, syncope, chest pain, tachycardia, palpitations, or skipped beats. Denies pain with urination, blood in urine, pus in urine, difficulty with balnance or walking. Denies color change or swelling in joints, rash, or temperature of skin in the area of pain. Denies loss of control of bowel or urine, abnormal bruising or bleeding.     Objective:   Physical Exam  See Dr. Frederik Pear Note for full PE.      Assessment & Plan:  See Dr. Frederik Pear Note

## 2013-11-22 NOTE — Telephone Encounter (Signed)
Pt came in today and had a chest x-ray done. Pt was wondering the reason of the chest x-ray today because he just had one done 10/14/13. Please call pt.

## 2013-11-22 NOTE — Progress Notes (Signed)
   Subjective:    Patient ID: [name], male    DOB: 03-30-1935, 79 y.o.   MRN: [id number]  HPI  He is here with multiple complaints.  His chief concern his pain in the back. This started 2 weeks ago and is located in the upper back to the right of midline. He describes as a "pushing sensation" as well as sharp intermittently. Activity increases the severity, especially with walking or going up or down stairs.  The pain is up to a level V-6 on 10 scale. It can last 15-30 minutes  It is improved with rest.  He also describes dyspnea. This occurs with activity over the last 30 days. He describes it as fatigue and having to breathe "heavily".  He's had abdominal pain since early March in the right upper quadrant. It is improved from when he passes flatus. The pain is actually better today.   He has associated constipation for which fiber supplements help  He does have intermittent dyspepsia for which he takes [place].  Review of Systems  He may have lost 1-2 pounds during this period. He feels he lost a total of 9 pounds in past year  He denies fever, chills, or sweats  He's had no changes in his vision such as blurred vision, double vision, loss of vision  He has no associated headaches.  There is no chest pain or tachycardia or dysrhythmia.  He has no melena, rectal bleeding, or change in caliber of stools.  There is no dysuria, pyuria, or hematuria.  He does have some myalgias as well as some weakness and numbness in the extremities. Shortness of breath is not  associated with cough or sputum production.  He is not having anorexia, vomiting, or hematemesis.        Objective:   Physical Exam General appearance is one of good health and nourishment w/o distress.  Eyes: No conjunctival inflammation or scleral icterus is present.  Decreased ROM of cervical spine  Oral exam: Upper plate; lips and gums are healthy appearing.There is no oropharyngeal erythema or  exudate noted.   Heart:  Normal rate and regular rhythm. S1 and S2 normal without gallop, murmur, click, rub or other extra sounds     Lungs:Chest clear to auscultation; no wheezes, rhonchi,rales ,or rubs present.No increased work of breathing.   Abdomen: bowel sounds normal, soft and non-tender without masses, organomegaly or hernias noted.  No guarding or rebound . No tenderness over the flanks to percussion  Musculoskeletal: Able to lie flat and sit up without help. Negative straight leg raising bilaterally. Crepitus shoulders but full ROM.Gait normal  Skin:Warm & dry.  Intact without suspicious lesions or rashes ; no jaundice or tenting  Lymphatic: No lymphadenopathy is noted about the head, neck, axilla               Assessment & Plan:   #1 back and shoulder pain  #2 dyspnea  #3 abdominal pain, improving without definitive treatment  See orders and recommendations

## 2013-11-28 ENCOUNTER — Telehealth: Payer: Self-pay | Admitting: Internal Medicine

## 2013-11-28 DIAGNOSIS — R1901 Right upper quadrant abdominal swelling, mass and lump: Secondary | ICD-10-CM

## 2013-11-28 DIAGNOSIS — R350 Frequency of micturition: Secondary | ICD-10-CM

## 2013-11-28 NOTE — Telephone Encounter (Signed)
Would like a phone call in regards to follow up labs Dr. Alwyn RenHopper is requesting on MyChart.

## 2013-11-28 NOTE — Telephone Encounter (Signed)
Phone call to patient wanting to verify repeat labs you want him to have. I advised patient of labs to be repeated. He states he has been having increased urine output.

## 2013-11-28 NOTE — Telephone Encounter (Signed)
Add UA for frequency

## 2013-11-28 NOTE — Telephone Encounter (Signed)
Orders have been placed.

## 2013-11-30 ENCOUNTER — Ambulatory Visit (INDEPENDENT_AMBULATORY_CARE_PROVIDER_SITE_OTHER): Payer: Medicare Other | Admitting: Internal Medicine

## 2013-11-30 ENCOUNTER — Other Ambulatory Visit (INDEPENDENT_AMBULATORY_CARE_PROVIDER_SITE_OTHER): Payer: Medicare Other

## 2013-11-30 ENCOUNTER — Encounter: Payer: Self-pay | Admitting: Internal Medicine

## 2013-11-30 VITALS — BP 120/76 | HR 73 | Temp 97.8°F | Wt 155.8 lb

## 2013-11-30 DIAGNOSIS — R1901 Right upper quadrant abdominal swelling, mass and lump: Secondary | ICD-10-CM

## 2013-11-30 DIAGNOSIS — R350 Frequency of micturition: Secondary | ICD-10-CM

## 2013-11-30 DIAGNOSIS — R0989 Other specified symptoms and signs involving the circulatory and respiratory systems: Secondary | ICD-10-CM

## 2013-11-30 DIAGNOSIS — R634 Abnormal weight loss: Secondary | ICD-10-CM

## 2013-11-30 DIAGNOSIS — R0609 Other forms of dyspnea: Secondary | ICD-10-CM

## 2013-11-30 DIAGNOSIS — R0600 Dyspnea, unspecified: Secondary | ICD-10-CM

## 2013-11-30 DIAGNOSIS — R748 Abnormal levels of other serum enzymes: Secondary | ICD-10-CM

## 2013-11-30 LAB — URINALYSIS
Bilirubin Urine: NEGATIVE
Hgb urine dipstick: NEGATIVE
Ketones, ur: NEGATIVE
Leukocytes, UA: NEGATIVE
NITRITE: NEGATIVE
Specific Gravity, Urine: <=1.005 — AB (ref 1.000–1.030)
Total Protein, Urine: NEGATIVE
Urine Glucose: NEGATIVE
Urobilinogen, UA: 0.2 (ref 0.0–1.0)
pH: 6.5 (ref 5.0–8.0)

## 2013-11-30 LAB — LIPASE: LIPASE: 80 U/L — AB (ref 11.0–59.0)

## 2013-11-30 LAB — AMYLASE: AMYLASE: 132 U/L — AB (ref 27–131)

## 2013-11-30 NOTE — Patient Instructions (Signed)
Your next office appointment will be determined based upon review of your pending Ultrasound. Those instructions will be transmitted to you through My Chart .

## 2013-11-30 NOTE — Progress Notes (Signed)
   Subjective:    Patient ID: [name], male    DOB: 07/29/1934, 79 y.o.   MRN: [id number]  HPI Pt presents today to follow up on abnormal pancreatic enzymes that were drawn on 11/22/13. On 11/22/13 the pt's amylase was 316 (nml 27-131)and the lipase was 132 (nml 11-59). Today his amylase is 132 and his lipase is 80. Pt also had a normal CXR on 11/22/13, at which time he was complaining of exertional dyspnea.   Today the pt continues to complain of exertional dyspnea and middle upper back pain. The pain has not worsened nor improved since his last visit. The pain continues to increases with activity, specifically while walking or going up or down stairs. It also seems to be worse in the heat. It causes referred chest soreness. The pt is concerned the chest soreness is cardiac in nature due to the combined exertional dyspnea. The pt also reports increased fatigue, weight loss of 9lbs over 12 months, and decreased appetite.  The pt would like to know what is the next step for assessing his pancreas.   Review of Systems  Constitutional: Positive for activity change, appetite change, fatigue and unexpected weight change. Negative for fever.  Respiratory: Positive for chest tightness.   Cardiovascular: Negative for palpitations and leg swelling.  Gastrointestinal: Negative for blood in stool.       No chalky stools   Genitourinary: Negative for hematuria and flank pain.        Objective:   Physical Exam        Assessment & Plan:

## 2013-11-30 NOTE — Progress Notes (Signed)
Pre visit review using our clinic review tool, if applicable. No additional management support is needed unless otherwise documented below in the visit note. 

## 2013-11-30 NOTE — Progress Notes (Signed)
   Subjective:    Patient ID: Georgia LopesJuan A Belshe, male    DOB: 02-24-1935, 79 y.o.   MRN: [id number]  HPI  He is here to followup his elevated pancreatic enzymes. One week ago his amylase was 316  & lipase 360. These have dropped to 132 and 80 respectively. These results were reviewed with him.  His alcohol intake is minimal.  He is very concerned about what he feels is significant weight loss.  His last colonoscopy was over 15 years ago.  He's concerned about possible cardiac issues because of dyspnea on exertion climbing stairs. He is overdue for Cardiology F/U with Dr Antoine PocheHochrein.  Flow sheet of vitals BMI were reviewed. His present weight is 155 lbs. 13 oz. He weighed 164 lbs. 6 oz. November 2013 representing possible < 9 pound weight loss. Weight of clothing would have been factor in body weight differential November.  Review of Systems Significant dyspepsia, dysphagia, melena, rectal bleeding, or persistently small caliber stools are denied. No history of dark urine or clay-colored stools.    Objective:   Physical Exam  Significant or distinguishing  findings on physical exam are documented first.  Below that are other systems examined & findings.   He has an upper plate &  lower partial.  He has a grade 1/2-1 systolic murmur.  The cholecystectomy operative scars are barely visible.  General appearance is one of good health and nourishment w/o distress.  Eyes: No conjunctival inflammation or scleral icterus is present.  Oral exam: lips and gums are healthy appearing.There is no oropharyngeal erythema or exudate noted.   Heart:  Normal rate and regular rhythm. S1 and S2 normal without gallop, click, rub or other extra sounds     Lungs:Chest clear to auscultation; no wheezes, rhonchi,rales ,or rubs present.No increased work of breathing.   Abdomen: bowel sounds normal, soft and non-tender (even to deep palpation & percussion) without masses, organomegaly or hernias noted.   No guarding or rebound . No tenderness over the flanks to percussion  Musculoskeletal: Able to lie flat and sit up without help. Negative straight leg raising bilaterally. Gait normal  Skin:Warm & dry.  Intact without suspicious lesions or rashes ; no jaundice or tenting  Lymphatic: No lymphadenopathy is noted about the head, neck, axilla areas.               Assessment & Plan:  #1 elevated pancreatic enzymes, resolving  #2 progressive exertional dyspnea  #3 coronary disease  Plan: Ultrasound of the pancreas ;the GI referral  Cardiology referral to assess need for stress imaging.

## 2013-12-01 LAB — URINE CULTURE
Colony Count: NO GROWTH
ORGANISM ID, BACTERIA: NO GROWTH

## 2013-12-02 ENCOUNTER — Ambulatory Visit
Admission: RE | Admit: 2013-12-02 | Discharge: 2013-12-02 | Disposition: A | Discharge status: Home / Self Care | Payer: Medicare Other | Source: Ambulatory Visit | Attending: Internal Medicine | Admitting: Internal Medicine

## 2013-12-02 DIAGNOSIS — R634 Abnormal weight loss: Secondary | ICD-10-CM

## 2013-12-02 DIAGNOSIS — R748 Abnormal levels of other serum enzymes: Secondary | ICD-10-CM

## 2013-12-03 ENCOUNTER — Other Ambulatory Visit: Payer: Self-pay | Admitting: Internal Medicine

## 2013-12-03 DIAGNOSIS — R748 Abnormal levels of other serum enzymes: Secondary | ICD-10-CM

## 2013-12-03 DIAGNOSIS — R634 Abnormal weight loss: Secondary | ICD-10-CM

## 2013-12-06 ENCOUNTER — Other Ambulatory Visit: Payer: Medicare Other

## 2013-12-06 ENCOUNTER — Telehealth: Payer: Self-pay | Admitting: Cardiology

## 2013-12-06 ENCOUNTER — Telehealth: Payer: Self-pay | Admitting: Internal Medicine

## 2013-12-06 NOTE — Telephone Encounter (Signed)
New problem   Pt need to speak to nurse concerning Chest Pain. Pt not having chest pain at the present. Please call pt.

## 2013-12-06 NOTE — Telephone Encounter (Signed)
Returned call to patient he stated he was returning Debbie's call about scheduling appointment,do not know who called him.Advised next appointment with Dr.Hochrein 11/15.Advised to call sooner if needed.

## 2013-12-06 NOTE — Telephone Encounter (Signed)
Follow Up ° °Pt returned call//  °

## 2013-12-06 NOTE — Telephone Encounter (Signed)
Left a message with patient's wife to return call

## 2013-12-06 NOTE — Telephone Encounter (Signed)
The pt states that Dr Alwyn RenHopper, his PCP, told him to call here for a stress test and that when he called he was advised that he would have to see his cardiologist first and was given an appointment for 01/20/14 which is the first available appt to see Dr Antoine PocheHochrein. He called the office 5  Mins. Before this call stating that he was returning a call but no one from this office called him. I attempted to explain to the pt that since this is not an emergency we would not be able to make him an appointment as soon as he wants it. He stated "what is wrong with office that cant see me like Dr Alwyn RenHopper said". Every time I tried to tell him that we had no available appointments at this time he and a women in the back ground would laugh and he would tell me that he is the pt and I have to do as he says. He then stated "you cant understand my language so I will put my daughter on the phone to explain". I again told him that I understand him and that talking to her will not change anything at this point. I could hear a lady yelling in the back ground so I told the pt I would have my Manager, Almira CoasterGina, call him and I hung up.

## 2013-12-06 NOTE — Telephone Encounter (Signed)
Pt request result of the ultrasound that was done 11/30/13. Please call pt

## 2013-12-06 NOTE — Telephone Encounter (Signed)
Patient has been informed.

## 2013-12-06 NOTE — Telephone Encounter (Signed)
Left a message for patient to return call - need to let him know a CAT scan is being ordered since gas in abdomen partially obscures visualization of the pancreas.

## 2013-12-06 NOTE — Telephone Encounter (Signed)
Patient states he would like to know more detailed results and recommendations than what is written on MyChart. Please call pt back.

## 2013-12-08 ENCOUNTER — Telehealth: Payer: Self-pay | Admitting: *Deleted

## 2013-12-08 NOTE — Telephone Encounter (Signed)
Left msg on triage requesting call back from Dr. Alwyn RenHopper. Called pt back he stated he received u/s results md wanted hin to have a MRI done & ? Stress test. Inform pt MRI referral is in place they will have to check insurance before setting up appt, and then give him a call. He also stated that Dr. Kirtland BouchardHocherin office call to make appt with him but its not until august nad he thinks he need to be seen sooner. He states he don't know why it take this long to see doctor. Requesting to come in to discuss issues with Dr. Alwyn RenHopper made appt for tomorrow 12/09/13 @ 10:00am.../lmb

## 2013-12-09 ENCOUNTER — Other Ambulatory Visit (INDEPENDENT_AMBULATORY_CARE_PROVIDER_SITE_OTHER): Payer: Medicare Other

## 2013-12-09 ENCOUNTER — Encounter: Payer: Self-pay | Admitting: Internal Medicine

## 2013-12-09 ENCOUNTER — Other Ambulatory Visit: Payer: Self-pay | Admitting: Cardiology

## 2013-12-09 ENCOUNTER — Encounter: Payer: Self-pay | Admitting: Cardiology

## 2013-12-09 ENCOUNTER — Ambulatory Visit (INDEPENDENT_AMBULATORY_CARE_PROVIDER_SITE_OTHER): Payer: Medicare Other | Admitting: Cardiology

## 2013-12-09 ENCOUNTER — Ambulatory Visit (INDEPENDENT_AMBULATORY_CARE_PROVIDER_SITE_OTHER): Payer: Medicare Other | Admitting: Internal Medicine

## 2013-12-09 VITALS — BP 120/70 | HR 73 | Temp 97.7°F | Wt 156.0 lb

## 2013-12-09 VITALS — BP 121/62 | HR 74 | Ht 64.0 in | Wt 157.0 lb

## 2013-12-09 DIAGNOSIS — R748 Abnormal levels of other serum enzymes: Secondary | ICD-10-CM

## 2013-12-09 DIAGNOSIS — E782 Mixed hyperlipidemia: Secondary | ICD-10-CM

## 2013-12-09 DIAGNOSIS — R0609 Other forms of dyspnea: Secondary | ICD-10-CM

## 2013-12-09 DIAGNOSIS — R079 Chest pain, unspecified: Secondary | ICD-10-CM

## 2013-12-09 DIAGNOSIS — I200 Unstable angina: Secondary | ICD-10-CM | POA: Insufficient documentation

## 2013-12-09 DIAGNOSIS — R0600 Dyspnea, unspecified: Secondary | ICD-10-CM

## 2013-12-09 DIAGNOSIS — R0989 Other specified symptoms and signs involving the circulatory and respiratory systems: Secondary | ICD-10-CM

## 2013-12-09 DIAGNOSIS — I2510 Atherosclerotic heart disease of native coronary artery without angina pectoris: Secondary | ICD-10-CM

## 2013-12-09 DIAGNOSIS — I10 Essential (primary) hypertension: Secondary | ICD-10-CM

## 2013-12-09 LAB — CBC WITH DIFFERENTIAL/PLATELET
Basophils Absolute: 0.1 10*3/uL (ref 0.0–0.1)
Basophils Relative: 1 % (ref 0–1)
EOS ABS: 0.5 10*3/uL (ref 0.0–0.7)
Eosinophils Relative: 7 % — ABNORMAL HIGH (ref 0–5)
HCT: 42.7 % (ref 39.0–52.0)
HEMOGLOBIN: 15.3 g/dL (ref 13.0–17.0)
LYMPHS ABS: 1.5 10*3/uL (ref 0.7–4.0)
Lymphocytes Relative: 20 % (ref 12–46)
MCH: 31 pg (ref 26.0–34.0)
MCHC: 35.8 g/dL (ref 30.0–36.0)
MCV: 86.6 fL (ref 78.0–100.0)
MONOS PCT: 9 % (ref 3–12)
Monocytes Absolute: 0.7 10*3/uL (ref 0.1–1.0)
Neutro Abs: 4.6 10*3/uL (ref 1.7–7.7)
Neutrophils Relative %: 63 % (ref 43–77)
PLATELETS: 222 10*3/uL (ref 150–400)
RBC: 4.93 MIL/uL (ref 4.22–5.81)
RDW: 13.3 % (ref 11.5–15.5)
WBC: 7.3 10*3/uL (ref 4.0–10.5)

## 2013-12-09 LAB — AMYLASE: Amylase: 108 U/L (ref 27–131)

## 2013-12-09 LAB — BASIC METABOLIC PANEL
BUN: 17 mg/dL (ref 6–23)
CHLORIDE: 99 meq/L (ref 96–112)
CO2: 27 mEq/L (ref 19–32)
CREATININE: 0.84 mg/dL (ref 0.50–1.35)
Calcium: 9.6 mg/dL (ref 8.4–10.5)
Glucose, Bld: 96 mg/dL (ref 70–99)
Potassium: 4 mEq/L (ref 3.5–5.3)
Sodium: 135 mEq/L (ref 135–145)

## 2013-12-09 LAB — PROTIME-INR
INR: 1.01 (ref <1.50)
Prothrombin Time: 13.3 seconds (ref 11.6–15.2)

## 2013-12-09 LAB — LIPASE: Lipase: 46 U/L (ref 11.0–59.0)

## 2013-12-09 NOTE — Progress Notes (Signed)
Matthew LopesJuan A [name] Date of Birth: 1935/06/06 Medical Record #[id number]  History of Present Illness: [name] is seen as a work in patient today. He is a pleasant 79 yo Male patient of Dr. Antoine PocheHochrein. He is s/p CABG in 2003 by Dr. Cornelius Moraswen with LIMA to the LAD and SVG to the Diagonal. By cath in 2008 he had a high grade proximal RCA stenosis and had stenting with a 2.75 mm Promus stent by Dr. Juanda ChanceBrodie. He was noted to have an atretic SVG to the diagonal but it was felt that antegrade flow into the diagonal was OK. His last stress Myoview in November 2012 showed a fixed apical defect without ischemia and EF 62%.  Over the last 6 weeks he complains of pain beginning in the mid back radiating through to the mid chest and radiating across his chest. He has associated dyspnea and neck pain. The pain is all exertional but has progressed to the point that he cannot do any activity without having the pain. If he stops and rests his pain resolves within 10 minutes. He has not taken any Ntg. He states the pain is similar to his symptoms before his last stent.  He does have a history of a murmur that has not been evaluated by Echo. He recently was seen by Dr. Alwyn RenHopper with elevated pancreatic enzymes of unclear significance. These have returned to normal. Abd. US was negative but could not visualize the entire pancreas. He is s/p cholecystectomy. He denies abdominal pain. No history of TIA/CVA, or bleeding.    Medication List       This list is accurate as of: 12/09/13  6:04 PM.  Always use your most recent med list.               aspirin 81 MG tablet  Take 81 mg by mouth daily.     CLARITIN PO  Take by mouth as needed.     metoprolol tartrate 25 MG tablet  Commonly known as:  LOPRESSOR  TAKE HALF TABLET BY MOUTH TWICE DAILY     MULTIVITAMIN PO  Take 1 tablet by mouth daily.     nitroGLYCERIN 0.4 MG SL tablet  Commonly known as:  NITROSTAT  Place 1 tablet (0.4 mg total) under the tongue every 5  (five) minutes as needed.     rosuvastatin 20 MG tablet  Commonly known as:  CRESTOR  Take 20 mg by mouth daily.         Allergies  Allergen Reactions  . Codeine Nausea Only    Past Medical History  Diagnosis Date  . Hyperlipidemia   . Hypertension   . Diverticulosis   . Gilbert's syndrome   . Osteopenia 2010     T score -1.9 @ hip (femoral neck)  . CAD (coronary artery disease)     Dr Antoine PocheHochrein  . PUD (peptic ulcer disease)     PMH , ? 1995    Past Surgical History  Procedure Laterality Date  . Hemorrhoid surgery    . Cholecystectomy  2004  . Cervical fusion  1997    Dr Jeral FruitBotero  . Coronary artery bypass graft  2003    Last catheterization was in August 2008 demonstrated a LIMA to th LAD which was patent, there was an atretic saphenous vein graft to the diagonal, the LAD had a 90% stenosis in the large calcified segment, the diagonal has ostial 70% stenosis, the circumflex had a ramus intermediate with ostial 25% stenosis, the right  coronary artery was dominant.  There was an ulcerated 80%-90% stenosis.    . Tonsillectomy and adenoidectomy    . Coronary stent placement  2008    drug-eluting stent to right coronary artery  . Upper gastrointestinal endoscopy       Dr Victorino Dike  . Colonoscopy      Dr Victorino Dike    History   Social History  . Marital Status: Married    Spouse Name: N/A    Number of Children: N/A  . Years of Education: N/A   Social History Main Topics  . Smoking status: Former Smoker    Quit date: 06/16/1978  . Smokeless tobacco: Never Used     Comment: smoked 1955-1980, up to 1 ppd  . Alcohol Use: 3.5 oz/week    7 drink(s) per week  . Drug Use: No  . Sexual Activity: None   Other Topics Concern  . None   Social History Narrative   Walks once a week for exercise    Family History  Problem Relation Age of Onset  . Cancer Father     ? primary  . Uterine cancer Daughter   . Hypertension Maternal Grandmother   . Osteoporosis Sister    . Diabetes Sister   . Stroke Neg Hx   . Peripheral vascular disease Maternal Grandmother   . Heart attack Neg Hx     Review of Systems: As noted in HPI.  All other systems were reviewed and are negative.  Physical Exam: BP 121/62  Pulse 74  Ht 5\' 4"  (1.626 m)  Wt 157 lb (71.215 kg)  BMI 26.94 kg/m2 Filed Weights   12/09/13 1457  Weight: 157 lb (71.215 kg)  GENERAL:  Well appearing WM in NAD. HEENT:  PERRL, EOMI, sclera are clear. Oropharynx is clear. NECK:  No jugular venous distention, carotid upstroke brisk and symmetric, no bruits, no thyromegaly or adenopathy LUNGS:  Clear to auscultation bilaterally CHEST:  Unremarkable HEART:  RRR,  PMI not displaced or sustained,S1 and S2 within normal limits, Grade 2-3/6 harsh systolic murmur at the apex.  ABD:  Soft, nontender. BS +, no masses or bruits. No hepatomegaly, no splenomegaly EXT:  2 + pulses throughout, no edema, no cyanosis no clubbing SKIN:  Warm and dry.  No rashes NEURO:  Alert and oriented x 3. Cranial nerves II through XII intact. PSYCH:  Cognitively intact    LABORATORY DATA: Ecg: 10/26/13: NSR, old septal infarct.  CXR: CHEST 2 VIEW  COMPARISON: 10/14/2013  FINDINGS: Normal heart size post CABG.  Atherosclerotic calcification aorta.  Mediastinal contours and pulmonary vascularity normal.  Lungs clear.  No acute infiltrate, pleural effusion or pneumothorax.  Bones unremarkable.  IMPRESSION: No acute abnormalities.   Electronically Signed By: Ulyses Southward M.D. On: 11/22/2013 16:34          Lab Results  Component Value Date   WBC 6.8 10/26/2013   HGB 16.8 10/26/2013   HCT 46.6 10/26/2013   PLT 197 10/26/2013   GLUCOSE 127* 10/26/2013   CHOL 134 10/14/2013   TRIG 69.0 10/14/2013   HDL 48.10 10/14/2013   LDLCALC 72 10/14/2013   ALT 19 11/22/2013   AST 20 11/22/2013   NA 143 10/26/2013   K 3.9 10/26/2013   CL 103 10/26/2013   CREATININE 0.80 10/26/2013   BUN 19 10/26/2013   CO2 26 10/26/2013   TSH  1.20 10/14/2013   PSA 0.98 06/04/2006   INR 0.9 02/06/2007   HGBA1C 5.7 02/13/2011  Assessment / Plan: 1. Crescendo angina- class 3. Known CAD s/p CABG in 2003 and subsequent stenting of native RCA 2008. I have recommended proceeding directly to cardiac cath with possible PCI. Will schedule for Monday. Instructed patient not to do anything strenuous this weekend. Take Ntg as needed for pain. If pain unresolved with Ntg or increases in frequency he is instructed to call 911 and go to the hospital.The procedure and risks were reviewed including but not limited to death, myocardial infarction, stroke, arrythmias, bleeding, transfusion, emergency surgery, dye allergy, or renal dysfunction. The patient voices understanding and is agreeable to proceed..  2. Heart murmur. Could be MR or AS. Will need to give attention to this at time of cardiac cath. May need Echo as well.   3. Elevated Amylase/lipase of unclear significance. Levels now back to normal. Asymptomatic. Abdominal CT planned depending on results of cardiac evaluation.  4. Hyperlipidemia- on statin  5. HTN- controlled.

## 2013-12-09 NOTE — Progress Notes (Signed)
   Subjective:    Patient ID: [name], male    DOB: 1934-06-23, 79 y.o.   MRN: [id number]  HPI  His chief symptom this time is exertional chest discomfort and dyspnea. After climbing ine stairs he describes a tightness in the chest with back radiation as well as dyspnea. There is no associated nausea or sweating.  He has had difficulty scheduling a followup cardiology evaluation.  Additionally a CT scan scheduling still pending. This was ordered because of elevated pancreatic enzymes; these had decreased significantly. Ultrasound of the abdomen did not divide optimal visualization of the pancreas.    Review of Systems  There's been no progressive weight loss.  He does not have significant abdominal pain, nausea, vomiting, melena, rectal bleeding,or change in his bowel habits.      Objective:   Physical Exam  He appears healthy and well-nourished in no distress  He appears younger than stated age  Dense arcus senilis is present. No scleral icterus is present.  He has an upper plate lower partial. Oropharynx reveals no erythema or exudates.  There is a grade 1 systolic murmur  Chest is clear without increased work of breathing  Abdomen reveals no organomegaly or masses.  He stabilized flat and sit up without help. Straight leg raising is negative.  He has no lymphadenopathy about the neck or axilla.          Assessment & Plan:  #1 exertional chest pain and dyspnea; rule out anginal equivalent  #2 elevated pancreatic enzymes  Plan: Cardiology consultation today at 2:45 PM  Repeat pancreatic enzymes. Confirm scheduling of CT of the abdomen.

## 2013-12-09 NOTE — Progress Notes (Signed)
Pre visit review using our clinic review tool, if applicable. No additional management support is needed unless otherwise documented below in the visit note. 

## 2013-12-09 NOTE — Patient Instructions (Signed)
Your next office appointment will be determined based upon review of your pending labs. Those instructions will be transmitted to you through My Chart . 

## 2013-12-09 NOTE — Progress Notes (Signed)
   Subjective:    Patient ID: [name], male    DOB: 1934/08/20, 79 y.o.   MRN: [id number]  HPI Pt is here to f/u on Abdominal U/S completed 12/02/13 for elevated pancreatic enzymes. He is also here to discuss his cardiology referral for DOE.   Pt's U/S results were relayed to pt and he has questions about the finding. CT is ordered to better visualize pancreatic processes. [facility]  Pt was referred at his last visit on 11/30/13 to see cardiologist for probable stress testing due to his DOE. Pt was confused about the course of the referral. He was under the assumption that internal medicine would order the stress testing and then he would present to cardiology with the results. Today the pt is extremely frustrated with his phone calls and inability to [facility]. The pt is unable to see Dr. Eliot [name] until august. He is instead seeing [name] at [phone] today.   Review of Systems     Objective:   Physical Exam        Assessment & Plan:

## 2013-12-12 ENCOUNTER — Encounter (HOSPITAL_COMMUNITY)
Admission: RE | Disposition: A | Discharge status: Home / Self Care | Payer: Medicare Other | Source: Ambulatory Visit | Attending: Cardiovascular Disease

## 2013-12-12 ENCOUNTER — Ambulatory Visit (HOSPITAL_COMMUNITY)
Admission: RE | Admit: 2013-12-12 | Discharge: 2013-12-13 | Disposition: A | Discharge status: Home / Self Care | Payer: Medicare Other | Source: Ambulatory Visit | Attending: Cardiovascular Disease | Admitting: Cardiovascular Disease

## 2013-12-12 ENCOUNTER — Encounter (HOSPITAL_COMMUNITY): Payer: Self-pay | Admitting: General Practice

## 2013-12-12 DIAGNOSIS — Z87891 Personal history of nicotine dependence: Secondary | ICD-10-CM | POA: Insufficient documentation

## 2013-12-12 DIAGNOSIS — I200 Unstable angina: Secondary | ICD-10-CM | POA: Diagnosis not present

## 2013-12-12 DIAGNOSIS — M949 Disorder of cartilage, unspecified: Secondary | ICD-10-CM

## 2013-12-12 DIAGNOSIS — I209 Angina pectoris, unspecified: Secondary | ICD-10-CM | POA: Diagnosis present

## 2013-12-12 DIAGNOSIS — E785 Hyperlipidemia, unspecified: Secondary | ICD-10-CM | POA: Diagnosis not present

## 2013-12-12 DIAGNOSIS — Z955 Presence of coronary angioplasty implant and graft: Secondary | ICD-10-CM

## 2013-12-12 DIAGNOSIS — R011 Cardiac murmur, unspecified: Secondary | ICD-10-CM | POA: Insufficient documentation

## 2013-12-12 DIAGNOSIS — Z9861 Coronary angioplasty status: Secondary | ICD-10-CM | POA: Insufficient documentation

## 2013-12-12 DIAGNOSIS — I2510 Atherosclerotic heart disease of native coronary artery without angina pectoris: Secondary | ICD-10-CM | POA: Diagnosis not present

## 2013-12-12 DIAGNOSIS — E782 Mixed hyperlipidemia: Secondary | ICD-10-CM | POA: Diagnosis present

## 2013-12-12 DIAGNOSIS — K219 Gastro-esophageal reflux disease without esophagitis: Secondary | ICD-10-CM | POA: Insufficient documentation

## 2013-12-12 DIAGNOSIS — K5730 Diverticulosis of large intestine without perforation or abscess without bleeding: Secondary | ICD-10-CM | POA: Insufficient documentation

## 2013-12-12 DIAGNOSIS — I25810 Atherosclerosis of coronary artery bypass graft(s) without angina pectoris: Secondary | ICD-10-CM | POA: Insufficient documentation

## 2013-12-12 DIAGNOSIS — R748 Abnormal levels of other serum enzymes: Secondary | ICD-10-CM | POA: Insufficient documentation

## 2013-12-12 DIAGNOSIS — M899 Disorder of bone, unspecified: Secondary | ICD-10-CM

## 2013-12-12 DIAGNOSIS — I10 Essential (primary) hypertension: Secondary | ICD-10-CM | POA: Diagnosis present

## 2013-12-12 HISTORY — DX: Coronary angioplasty status: Z98.61

## 2013-12-12 HISTORY — DX: Cardiac murmur, unspecified: R01.1

## 2013-12-12 HISTORY — PX: LEFT HEART CATHETERIZATION WITH CORONARY/GRAFT ANGIOGRAM: SHX5450

## 2013-12-12 HISTORY — DX: Shortness of breath: R06.02

## 2013-12-12 HISTORY — DX: Gastro-esophageal reflux disease without esophagitis: K21.9

## 2013-12-12 HISTORY — DX: Angina pectoris, unspecified: I20.9

## 2013-12-12 HISTORY — DX: Unspecified osteoarthritis, unspecified site: M19.90

## 2013-12-12 HISTORY — DX: Other amnesia: R41.3

## 2013-12-12 HISTORY — PX: CORONARY STENT PLACEMENT: SHX1402

## 2013-12-12 HISTORY — DX: Atherosclerotic heart disease of native coronary artery without angina pectoris: I25.10

## 2013-12-12 LAB — POCT ACTIVATED CLOTTING TIME: Activated Clotting Time: 478 seconds

## 2013-12-12 SURGERY — LEFT HEART CATHETERIZATION WITH CORONARY/GRAFT ANGIOGRAM
Anesthesia: LOCAL

## 2013-12-12 MED ORDER — ATORVASTATIN CALCIUM 40 MG PO TABS
40.0000 mg | ORAL_TABLET | Freq: Every day | ORAL | Status: DC
  Filled 2013-12-12 (×2): qty 1

## 2013-12-12 MED ORDER — CLOPIDOGREL BISULFATE 300 MG PO TABS
ORAL_TABLET | ORAL | Status: AC
  Filled 2013-12-12: qty 1

## 2013-12-12 MED ORDER — METOPROLOL TARTRATE 12.5 MG HALF TABLET
12.5000 mg | ORAL_TABLET | Freq: Two times a day (BID) | ORAL | Status: DC
  Administered 2013-12-12: 22:00:00 12.5 mg via ORAL
  Filled 2013-12-12 (×3): qty 1

## 2013-12-12 MED ORDER — LORATADINE 10 MG PO TABS
10.0000 mg | ORAL_TABLET | Freq: Every day | ORAL | Status: DC
  Filled 2013-12-12: qty 1

## 2013-12-12 MED ORDER — ONDANSETRON HCL 4 MG/2ML IJ SOLN: 4.0000 mg | Freq: Four times a day (QID) | INTRAMUSCULAR | Status: DC | PRN

## 2013-12-12 MED ORDER — ACETAMINOPHEN 325 MG PO TABS: 650.0000 mg | ORAL_TABLET | ORAL | Status: DC | PRN

## 2013-12-12 MED ORDER — LIDOCAINE HCL (PF) 1 % IJ SOLN
INTRAMUSCULAR | Status: AC
  Filled 2013-12-12: qty 30

## 2013-12-12 MED ORDER — NITROGLYCERIN 0.2 MG/ML ON CALL CATH LAB
INTRAVENOUS | Status: AC
  Filled 2013-12-12: qty 1

## 2013-12-12 MED ORDER — SODIUM CHLORIDE 0.9 % IV SOLN: 1.0000 mL/kg/h | INTRAVENOUS | Status: AC

## 2013-12-12 MED ORDER — MIDAZOLAM HCL 2 MG/2ML IJ SOLN
INTRAMUSCULAR | Status: AC
  Filled 2013-12-12: qty 2

## 2013-12-12 MED ORDER — FENTANYL CITRATE 0.05 MG/ML IJ SOLN
INTRAMUSCULAR | Status: AC
  Filled 2013-12-12: qty 2

## 2013-12-12 MED ORDER — ADULT MULTIVITAMIN W/MINERALS CH
1.0000 | ORAL_TABLET | Freq: Every day | ORAL | Status: DC
  Filled 2013-12-12: qty 1

## 2013-12-12 MED ORDER — NITROGLYCERIN 0.4 MG SL SUBL: 0.4000 mg | SUBLINGUAL_TABLET | SUBLINGUAL | Status: DC | PRN

## 2013-12-12 MED ORDER — BIVALIRUDIN 250 MG IV SOLR
INTRAVENOUS | Status: AC
  Filled 2013-12-12: qty 250

## 2013-12-12 MED ORDER — ASPIRIN EC 81 MG PO TBEC
81.0000 mg | DELAYED_RELEASE_TABLET | Freq: Every day | ORAL | Status: DC
  Filled 2013-12-12: qty 1

## 2013-12-12 MED ORDER — FLUTICASONE PROPIONATE 50 MCG/ACT NA SUSP
1.0000 | Freq: Every day | NASAL | Status: DC
  Filled 2013-12-12: qty 16

## 2013-12-12 MED ORDER — CLOPIDOGREL BISULFATE 75 MG PO TABS
75.0000 mg | ORAL_TABLET | Freq: Every day | ORAL | Status: DC
  Administered 2013-12-13: 10:00:00 75 mg via ORAL
  Filled 2013-12-12: qty 1

## 2013-12-12 MED ORDER — SODIUM CHLORIDE 0.9 % IJ SOLN: 3.0000 mL | Freq: Two times a day (BID) | INTRAMUSCULAR | Status: DC

## 2013-12-12 MED ORDER — SODIUM CHLORIDE 0.9 % IJ SOLN: 3.0000 mL | INTRAMUSCULAR | Status: DC | PRN

## 2013-12-12 MED ORDER — SODIUM CHLORIDE 0.9 % IV SOLN
INTRAVENOUS | Status: DC
  Administered 2013-12-12: 11:00:00 via INTRAVENOUS

## 2013-12-12 MED ORDER — SODIUM CHLORIDE 0.9 % IV SOLN: 250.0000 mL | INTRAVENOUS | Status: DC | PRN

## 2013-12-12 MED ORDER — ASPIRIN 81 MG PO CHEW: 81.0000 mg | CHEWABLE_TABLET | ORAL | Status: DC

## 2013-12-12 MED ORDER — HEPARIN SODIUM (PORCINE) 1000 UNIT/ML IJ SOLN
INTRAMUSCULAR | Status: AC
  Filled 2013-12-12: qty 1

## 2013-12-12 MED ORDER — HEPARIN (PORCINE) IN NACL 2-0.9 UNIT/ML-% IJ SOLN
INTRAMUSCULAR | Status: AC
  Filled 2013-12-12: qty 1500

## 2013-12-12 NOTE — CV Procedure (Signed)
Cardiac Catheterization Procedure Note  Name: [name] MRN: [id number] DOB: November 21, 1934  Procedure: Left Heart Cath, Selective Coronary Angiography, LV angiography, LIMA angiography, SVG angiography, PTCA and stenting of the native RCA  Indication: Crescendo angina, CCS Class 703. 79 year-old male with known CAD, prior CABG and PCI  Procedural Details:  The left wrist was prepped, draped, and anesthetized with 1% lidocaine. Using the modified Seldinger technique, a 5/6 French sheath was introduced into the right radial artery. 3 mg of verapamil was administered through the sheath, weight-based unfractionated heparin was administered intravenously. Standard Judkins catheters were used for selective coronary angiography and left ventriculography. The JR 4 catheter was used for LIMA and saphenous vein graft angiography. Catheter exchanges were performed over an exchange length guidewire.  PROCEDURAL FINDINGS Hemodynamics: AO 118/88 LV 127/10   Coronary angiography: Coronary dominance: right  Left mainstem: The left main is patent with no evidence of coronary obstruction. The left main divides into the LAD, intermediate branch, and left circumflex.  Left anterior descending (LAD): The LAD is heavily calcified and totally occluded in its proximal portion. The vessel fills from the mammary graft.  Left circumflex (LCx): The left circumflex is patent. There are minor irregularities. There is a medium caliber ramus intermedius branch with no obstruction. The AV circumflex is patent with nonobstructive 20-30% stenosis. This vessel gives off a large PLA branch.  Right coronary artery (RCA): The RCA is dominant. The stented segment in the proximal aspect is patent. Just off the distal edge of the stent there is 95-99% stenosis. Distally the vessel has a tight stenosis in the distal aspect of the PDA branch of approximately 80-90%.  Saphenous vein graft to diagonal: Total occlusion of the  aortic anastomosis.  LIMA to LAD: This graft is widely patent. The distal anastomosis is widely patent. The LAD fills retrograde as does the first diagonal branch. The distal LAD is widely patent with no obstructive disease.  Left ventriculography: Left ventricular systolic function is normal, LVEF is estimated at 55-65%, there is no significant mitral regurgitation   PCI Note:  Following the diagnostic procedure, the decision was made to proceed with PCI of the native right coronary artery. The patient was loaded with Plavix 600 mg.  Weight-based bivalirudin was given for anticoagulation. Once a therapeutic ACT was achieved, a 6 [place]French JR 4 guide catheter was inserted.  A cougar coronary guidewire was used to cross the lesion.  The lesion was predilated with a 2.5 x 12 mm balloon.  The lesion was then stented with a 2.75 x 18 mm Xience drug-eluting stent.  The stent was postdilated with a 3.0 mm noncompliant balloon to maximum pressure of 18 atmospheres.  Following PCI, there was 0% residual stenosis and TIMI-3 flow. Final angiography confirmed an excellent result. The patient tolerated the procedure well. There were no immediate procedural complications. A TR band was used for radial hemostasis. The patient was transferred to the post catheterization recovery area for further monitoring.  PCI Data: Vessel - RCA/Segment - mid (distal stent edge restenosis) Percent Stenosis (pre)  95 TIMI-flow 3 Stent 2.75 x 18 mm Xience DES Percent Stenosis (post) 0 TIMI-flow (post) 3  Final Conclusions:   1. Severe 2 vessel coronary artery disease with severe stenosis of the RCA and total occlusion of the proximal LAD  2. Status post aortocoronary bypass surgery with continued patency of the LIMA to LAD and total occlusion of the vein graft to diagonal  3. Preserved LV systolic  function  4. Successful PCI of the right coronary artery using a single drug-eluting stent.   Recommendations:  Dual antiplatelet  therapy with aspirin and Plavix for at least 12 months. Anticipate discharge home tomorrow morning  Matthew BollmanMichael [name] 12/12/2013, 2:30 PM

## 2013-12-12 NOTE — Progress Notes (Signed)
TR BAND REMOVAL  LOCATION:    left radial  DEFLATED PER PROTOCOL:    Yes.    TIME BAND OFF / DRESSING APPLIED:    1830   SITE UPON ARRIVAL:    Level 1  SITE AFTER BAND REMOVAL:    Level 1  REVERSE ALLEN'S TEST:     positive  CIRCULATION SENSATION AND MOVEMENT:    Within Normal Limits   Yes.    COMMENTS:   TOLERATED PROCEDURE WELL

## 2013-12-12 NOTE — H&P (View-Only) (Signed)
[name] Date of Birth: 1935/06/06 Medical Record #[id number]  History of Present Illness: [name] is seen as a work in patient today. He is a pleasant 79 yo Male patient of Dr. Antoine PocheHochrein. He is s/p CABG in 2003 by Dr. Cornelius Moraswen with LIMA to the LAD and SVG to the Diagonal. By cath in 2008 he had a high grade proximal RCA stenosis and had stenting with a 2.75 mm Promus stent by Dr. Juanda ChanceBrodie. He was noted to have an atretic SVG to the diagonal but it was felt that antegrade flow into the diagonal was OK. His last stress Myoview in November 2012 showed a fixed apical defect without ischemia and EF 62%.  Over the last 6 weeks he complains of pain beginning in the mid back radiating through to the mid chest and radiating across his chest. He has associated dyspnea and neck pain. The pain is all exertional but has progressed to the point that he cannot do any activity without having the pain. If he stops and rests his pain resolves within 10 minutes. He has not taken any Ntg. He states the pain is similar to his symptoms before his last stent.  He does have a history of a murmur that has not been evaluated by Echo. He recently was seen by Dr. Alwyn RenHopper with elevated pancreatic enzymes of unclear significance. These have returned to normal. Abd. US was negative but could not visualize the entire pancreas. He is s/p cholecystectomy. He denies abdominal pain. No history of TIA/CVA, or bleeding.    Medication List       This list is accurate as of: 12/09/13  6:04 PM.  Always use your most recent med list.               aspirin 81 MG tablet  Take 81 mg by mouth daily.     CLARITIN PO  Take by mouth as needed.     metoprolol tartrate 25 MG tablet  Commonly known as:  LOPRESSOR  TAKE HALF TABLET BY MOUTH TWICE DAILY     MULTIVITAMIN PO  Take 1 tablet by mouth daily.     nitroGLYCERIN 0.4 MG SL tablet  Commonly known as:  NITROSTAT  Place 1 tablet (0.4 mg total) under the tongue every 5  (five) minutes as needed.     rosuvastatin 20 MG tablet  Commonly known as:  CRESTOR  Take 20 mg by mouth daily.         Allergies  Allergen Reactions  . Codeine Nausea Only    Past Medical History  Diagnosis Date  . Hyperlipidemia   . Hypertension   . Diverticulosis   . Gilbert's syndrome   . Osteopenia 2010     T score -1.9 @ hip (femoral neck)  . CAD (coronary artery disease)     Dr Antoine PocheHochrein  . PUD (peptic ulcer disease)     PMH , ? 1995    Past Surgical History  Procedure Laterality Date  . Hemorrhoid surgery    . Cholecystectomy  2004  . Cervical fusion  1997    Dr Jeral FruitBotero  . Coronary artery bypass graft  2003    Last catheterization was in August 2008 demonstrated a LIMA to th LAD which was patent, there was an atretic saphenous vein graft to the diagonal, the LAD had a 90% stenosis in the large calcified segment, the diagonal has ostial 70% stenosis, the circumflex had a ramus intermediate with ostial 25% stenosis, the right  coronary artery was dominant.  There was an ulcerated 80%-90% stenosis.    . Tonsillectomy and adenoidectomy    . Coronary stent placement  2008    drug-eluting stent to right coronary artery  . Upper gastrointestinal endoscopy       Dr Victorino Dike  . Colonoscopy      Dr Victorino Dike    History   Social History  . Marital Status: Married    Spouse Name: N/A    Number of Children: N/A  . Years of Education: N/A   Social History Main Topics  . Smoking status: Former Smoker    Quit date: 06/16/1978  . Smokeless tobacco: Never Used     Comment: smoked 1955-1980, up to 1 ppd  . Alcohol Use: 3.5 oz/week    7 drink(s) per week  . Drug Use: No  . Sexual Activity: None   Other Topics Concern  . None   Social History Narrative   Walks once a week for exercise    Family History  Problem Relation Age of Onset  . Cancer Father     ? primary  . Uterine cancer Daughter   . Hypertension Maternal Grandmother   . Osteoporosis Sister    . Diabetes Sister   . Stroke Neg Hx   . Peripheral vascular disease Maternal Grandmother   . Heart attack Neg Hx     Review of Systems: As noted in HPI.  All other systems were reviewed and are negative.  Physical Exam: BP 121/62  Pulse 74  Ht 5\' 4"  (1.626 m)  Wt 157 lb (71.215 kg)  BMI 26.94 kg/m2 Filed Weights   12/09/13 1457  Weight: 157 lb (71.215 kg)  GENERAL:  Well appearing WM in NAD. HEENT:  PERRL, EOMI, sclera are clear. Oropharynx is clear. NECK:  No jugular venous distention, carotid upstroke brisk and symmetric, no bruits, no thyromegaly or adenopathy LUNGS:  Clear to auscultation bilaterally CHEST:  Unremarkable HEART:  RRR,  PMI not displaced or sustained,S1 and S2 within normal limits, Grade 2-3/6 harsh systolic murmur at the apex.  ABD:  Soft, nontender. BS +, no masses or bruits. No hepatomegaly, no splenomegaly EXT:  2 + pulses throughout, no edema, no cyanosis no clubbing SKIN:  Warm and dry.  No rashes NEURO:  Alert and oriented x 3. Cranial nerves II through XII intact. PSYCH:  Cognitively intact    LABORATORY DATA: Ecg: 10/26/13: NSR, old septal infarct.  CXR: CHEST 2 VIEW  COMPARISON: 10/14/2013  FINDINGS: Normal heart size post CABG.  Atherosclerotic calcification aorta.  Mediastinal contours and pulmonary vascularity normal.  Lungs clear.  No acute infiltrate, pleural effusion or pneumothorax.  Bones unremarkable.  IMPRESSION: No acute abnormalities.   Electronically Signed By: Ulyses Southward M.D. On: 11/22/2013 16:34          Lab Results  Component Value Date   WBC 6.8 10/26/2013   HGB 16.8 10/26/2013   HCT 46.6 10/26/2013   PLT 197 10/26/2013   GLUCOSE 127* 10/26/2013   CHOL 134 10/14/2013   TRIG 69.0 10/14/2013   HDL 48.10 10/14/2013   LDLCALC 72 10/14/2013   ALT 19 11/22/2013   AST 20 11/22/2013   NA 143 10/26/2013   K 3.9 10/26/2013   CL 103 10/26/2013   CREATININE 0.80 10/26/2013   BUN 19 10/26/2013   CO2 26 10/26/2013   TSH  1.20 10/14/2013   PSA 0.98 06/04/2006   INR 0.9 02/06/2007   HGBA1C 5.7 02/13/2011  Assessment / Plan: 1. Crescendo angina- class 3. Known CAD s/p CABG in 2003 and subsequent stenting of native RCA 2008. I have recommended proceeding directly to cardiac cath with possible PCI. Will schedule for Monday. Instructed patient not to do anything strenuous this weekend. Take Ntg as needed for pain. If pain unresolved with Ntg or increases in frequency he is instructed to call 911 and go to the hospital.The procedure and risks were reviewed including but not limited to death, myocardial infarction, stroke, arrythmias, bleeding, transfusion, emergency surgery, dye allergy, or renal dysfunction. The patient voices understanding and is agreeable to proceed..  2. Heart murmur. Could be MR or AS. Will need to give attention to this at time of cardiac cath. May need Echo as well.   3. Elevated Amylase/lipase of unclear significance. Levels now back to normal. Asymptomatic. Abdominal CT planned depending on results of cardiac evaluation.  4. Hyperlipidemia- on statin  5. HTN- controlled.

## 2013-12-12 NOTE — Care Management Note (Addendum)
  Page 1 of 1   12/13/2013     10:59:15 AM CARE MANAGEMENT NOTE 12/13/2013  Patient:  Burlene ArntBERNACCHI,Ladislav A   Account Number:  [id number]  Date Initiated:  12/12/2013  Documentation initiated by:  Anup Brigham  Subjective/Objective Assessment:   LEFT HEART CATHETERIZATION WITH CORONARY/GRAFT ANGIOGRAM     Action/Plan:   CM to follow for disposition needs   Anticipated DC Date:  12/13/2013   Anticipated DC Plan:  HOME/SELF CARE      DC Planning Services  CM consult  Medication Assistance      Choice offered to / List presented to:             Status of service:  Completed, signed off Medicare Important Message given?   (If response is "NO", the following Medicare IM given date fields will be blank) Date Medicare IM given:   Medicare IM given by:   Date Additional Medicare IM given:   Additional Medicare IM given by:    Discharge Disposition:  HOME/SELF CARE  Per UR Regulation:    If discussed at Long Length of Stay Meetings, dates discussed:    Comments:  Thu Baggett RN, BSN, MSHL, CCM  Nurse - Case Manager, ([phone])  [phone]  12/12/2013 Med Review:  Plavix Pharmacy:  [facility] - Nurse confirms [facility] has med available. Disposition:  Home / Self care.

## 2013-12-12 NOTE — Interval H&P Note (Signed)
History and Physical Interval Note:  12/12/2013 1:15 PM  [name]  has presented today for surgery, with the diagnosis of CP  The various methods of treatment have been discussed with the patient and family. After consideration of risks, benefits and other options for treatment, the patient has consented to  Procedure(s): LEFT HEART CATHETERIZATION WITH CORONARY/GRAFT ANGIOGRAM (N/A) as a surgical intervention .  The patient's history has been reviewed, patient examined, no change in status, stable for surgery.  I have reviewed the patient's chart and labs.  Questions were answered to the patient's satisfaction.    Cath Lab Visit (complete for each Cath Lab visit)  Clinical Evaluation Leading to the Procedure:   ACS: No.  Non-ACS:    Anginal Classification: CCS III  Anti-ischemic medical therapy: Minimal Therapy (1 class of medications)  Non-Invasive Test Results: No non-invasive testing performed  Prior CABG: Previous CABG       Tonny BollmanMichael Gennie Eisinger

## 2013-12-13 ENCOUNTER — Other Ambulatory Visit: Payer: Self-pay | Admitting: Physician Assistant

## 2013-12-13 ENCOUNTER — Other Ambulatory Visit: Payer: Medicare Other

## 2013-12-13 ENCOUNTER — Encounter (HOSPITAL_COMMUNITY): Payer: Self-pay | Admitting: Cardiology

## 2013-12-13 DIAGNOSIS — I200 Unstable angina: Secondary | ICD-10-CM | POA: Diagnosis not present

## 2013-12-13 DIAGNOSIS — I25810 Atherosclerosis of coronary artery bypass graft(s) without angina pectoris: Secondary | ICD-10-CM | POA: Diagnosis not present

## 2013-12-13 DIAGNOSIS — I2510 Atherosclerotic heart disease of native coronary artery without angina pectoris: Secondary | ICD-10-CM | POA: Diagnosis not present

## 2013-12-13 DIAGNOSIS — R011 Cardiac murmur, unspecified: Secondary | ICD-10-CM

## 2013-12-13 DIAGNOSIS — E785 Hyperlipidemia, unspecified: Secondary | ICD-10-CM | POA: Diagnosis not present

## 2013-12-13 LAB — BASIC METABOLIC PANEL
BUN: 14 mg/dL (ref 6–23)
CHLORIDE: 101 meq/L (ref 96–112)
CO2: 24 mEq/L (ref 19–32)
Calcium: 9.4 mg/dL (ref 8.4–10.5)
Creatinine, Ser: 0.74 mg/dL (ref 0.50–1.35)
GFR calc Af Amer: >90 mL/min (ref >90)
GFR calc non Af Amer: 87 mL/min — ABNORMAL LOW (ref >90)
Glucose, Bld: 91 mg/dL (ref 70–99)
POTASSIUM: 4 meq/L (ref 3.7–5.3)
Sodium: 138 mEq/L (ref 137–147)

## 2013-12-13 LAB — CBC
HEMATOCRIT: 43.3 % (ref 39.0–52.0)
Hemoglobin: 14.9 g/dL (ref 13.0–17.0)
MCH: 30.5 pg (ref 26.0–34.0)
MCHC: 34.4 g/dL (ref 30.0–36.0)
MCV: 88.7 fL (ref 78.0–100.0)
Platelets: 189 10*3/uL (ref 150–400)
RBC: 4.88 MIL/uL (ref 4.22–5.81)
RDW: 12.6 % (ref 11.5–15.5)
WBC: 7.1 10*3/uL (ref 4.0–10.5)

## 2013-12-13 MED ORDER — CLOPIDOGREL BISULFATE 75 MG PO TABS: 75.0000 mg | ORAL_TABLET | Freq: Every day | ORAL | Status: DC

## 2013-12-13 MED FILL — Sodium Chloride IV Soln 0.9%: INTRAVENOUS | Qty: 50 | Status: AC

## 2013-12-13 NOTE — Discharge Summary (Signed)
I saw & examined the patient prior to discharge. Agree with summary.  OP Echo to evaluate murmur.   Stable post PCI.  ROV with [name].  See last PN for details.  Marykay LexHARDING,Rosbel Buckner W, MD

## 2013-12-13 NOTE — Discharge Instructions (Signed)
Angina Pectoris °Angina pectoris is extreme discomfort in your chest, neck, or arm. Your doctor may call it just angina. It is caused by a lack of oxygen to your heart wall. It may feel like tightness or heavy pressure. It may feel like a crushing or squeezing pain. Some people say it feels like gas. It may go down your shoulders, back, and arms. Some people have symptoms other than pain. These include: °· Tiredness. °· Shortness of breath. °· Cold sweats. °· Feeling sick to your stomach (nausea). °There are four types of angina: °· Stable angina. This type often lasts the same amount of time each time it happens. Activity, stress, or excitement can bring it on. It often gets better after taking a medicine called nitroglycerin. This goes under your tongue. °· Unstable angina. This type can happen when you are not active or even during sleep. It can suddenly get worse or happen more often. It may not get better after taking the special medicine. It can last up to 30 minutes. °· Microvascular angina. This type is more common in women. It may be more severe or last longer than other types. °· Prinzmetal angina. This type often happens when you are not active or in the early morning hours. °HOME CARE  °· Only take medicines as told by your doctor. °· Stay active or exercise more as told by your doctor. °· Limit very hard activity as told by your doctor. °· Limit heavy lifting as told by your doctor. °· Keep a healthy weight. °· Learn about and eat foods that are healthy for your heart. °· Do not use any tobacco such as cigarettes, chewing tobacco, or e-cigarettes. °GET HELP RIGHT AWAY IF:  °· You have chest, neck, deep shoulder, or arm pain or discomfort that lasts more than a few minutes. °· You have chest, neck, deep shoulder, or arm pain or discomfort that goes away and comes back over and over again. °· You have heavy sweating that seems to happen for no reason. °· You have shortness of breath or trouble  breathing. °· Your angina does not get better after a few minutes of rest. °· Your angina does not get better after you take nitroglycerin medicine. °These can all be symptoms of a heart attack. Get help right away. Call your local emergency service (911 in [place] Do not  drive yourself to the hospital. Do not  wait to for your symptoms to go away. °MAKE SURE YOU:  °· Understand these instructions. °· Will watch your condition. °· Will get help right away if you are not doing well or get worse. °Document Released: 11/19/2007 Document Revised: 06/07/2013 Document Reviewed: 03/11/2012 °ExitCare® Patient Information ©2015 [employer]. This information is not intended to replace advice given to you by your health care provider. Make sure you discuss any questions you have with your health care provider. ° °No driving for 24 hours. No lifting over 5 lbs for 1 week. No sexual activity for 1 week. Keep procedure site clean & dry. If you notice increased pain, swelling, bleeding or pus, call/return!  You may shower, but no soaking baths/hot tubs/pools for 1 week.  ° ° °

## 2013-12-13 NOTE — Discharge Summary (Signed)
Discharge Summary   Patient ID: Matthew LopesJuan A [name],  MRN: [id number], DOB/AGE: [birth year] 77 y.o.  Admit date: 12/12/2013 Discharge date: 12/13/2013  Primary Care Provider: Marga MelnickWilliam Hopper Primary Cardiologist: Dr. Antoine PocheHochrein  Discharge Diagnoses Principal Problem:   Angina, class III  - single DES to RCA on 12/12/2013 Active Problems:   CAD S/P percutaneous coronary angioplasty - PCI of RCA (12/12/2013)   HYPERLIPIDEMIA   HYPERTENSION, ESSENTIAL NOS   GERD   Allergies Allergies  Allergen Reactions  . Codeine Nausea Only    Procedures  Procedure: Left Heart Cath, Selective Coronary Angiography, LV angiography, LIMA angiography, SVG angiography, PTCA and stenting of the native RCA  Indication: Crescendo angina, CCS Class 663. 79 year-old male with known CAD, prior CABG and PCI  PROCEDURAL FINDINGS  Hemodynamics:  AO 118/88  LV 127/10  Coronary angiography:  Coronary dominance: right  Left mainstem: The left main is patent with no evidence of coronary obstruction. The left main divides into the LAD, intermediate branch, and left circumflex.  Left anterior descending (LAD): The LAD is heavily calcified and totally occluded in its proximal portion. The vessel fills from the mammary graft.  Left circumflex (LCx): The left circumflex is patent. There are minor irregularities. There is a medium caliber ramus intermedius branch with no obstruction. The AV circumflex is patent with nonobstructive 20-30% stenosis. This vessel gives off a large PLA branch.  Right coronary artery (RCA): The RCA is dominant. The stented segment in the proximal aspect is patent. Just off the distal edge of the stent there is 95-99% stenosis. Distally the vessel has a tight stenosis in the distal aspect of the PDA branch of approximately 80-90%.  Saphenous vein graft to diagonal: Total occlusion of the aortic anastomosis.  LIMA to LAD: This graft is widely patent. The distal anastomosis is widely patent. The LAD  fills retrograde as does the first diagonal branch. The distal LAD is widely patent with no obstructive disease.  Left ventriculography: Left ventricular systolic function is normal, LVEF is estimated at 55-65%, there is no significant mitral regurgitation  PCI Note: Following the diagnostic procedure, the decision was made to proceed with PCI of the native right coronary artery. The patient was loaded with Plavix 600 mg. Weight-based bivalirudin was given for anticoagulation. Once a therapeutic ACT was achieved, a 6 [place] JR 4 guide catheter was inserted. A cougar coronary guidewire was used to cross the lesion. The lesion was predilated with a 2.5 x 12 mm balloon. The lesion was then stented with a 2.75 x 18 mm Xience drug-eluting stent. The stent was postdilated with a 3.0 mm noncompliant balloon to maximum pressure of 18 atmospheres. Following PCI, there was 0% residual stenosis and TIMI-3 flow. Final angiography confirmed an excellent result. The patient tolerated the procedure well. There were no immediate procedural complications. A TR band was used for radial hemostasis. The patient was transferred to the post catheterization recovery area for further monitoring.  PCI Data:  Vessel - RCA/Segment - mid (distal stent edge restenosis)  Percent Stenosis (pre) 95  TIMI-flow 3  Stent 2.75 x 18 mm Xience DES  Percent Stenosis (post) 0  TIMI-flow (post) 3  Final Conclusions:  1. Severe 2 vessel coronary artery disease with severe stenosis of the RCA and total occlusion of the proximal LAD  2. Status post aortocoronary bypass surgery with continued patency of the LIMA to LAD and total occlusion of the vein graft to diagonal  3. Preserved LV systolic function  4. Successful  PCI of the right coronary artery using a single drug-eluting stent.  Recommendations:  Dual antiplatelet therapy with aspirin and Plavix for at least 12 months. Anticipate discharge home tomorrow morning    Hospital Course The  patient is a 79 year old Caucasian male with past medical history significant for hypertension, hyperlipidemia and history of coronary disease status post CABG in 2003 with LIMA to LAD, SVG to diagonal. He was seen by [name] in the clinic on 12/09/2013 at which time he complained of having chest discomfort with minimal exertion for the past 6 weeks. He also noted to have a history of a murmur which has not been previously evaluated by echocardiogram. Of note, patient was recently seen by Dr. Alwyn RenHopper for elevated pancreatic enzyme of unclear significance. Repeat pancreatic enzymes returned to normal level. Given the patient's significant class III angina, a cardiac catheterization was recommended. Patient underwent a scheduled outpatient cardiac catheterization on 12/12/2013 which showed EF 55-65%, totally occluded proximal LAD filled by LIMA, occluded SVG to diag, 95-99% stenosis distal to the previous RCA stent treated with a single drug-eluting stent, 80-90% stenosis distal PLA. Plavix was added to his medical regimen along with aspirin, metoprolol and statin.  Patient was seen the morning of 12/13/2013 at which time he denies any significant chest discomfort, shortness breath, or dizziness. The left radial cath site appears to be stable, clean, dry, without significant bleeding or hematoma. He was seen by cardiac rehab in the morning and was able to ambulate without significant chest discomfort. He will be discharged to followup with Dr. Antoine PocheHochrein or his APP. Prior to his followup in the clinic, patient will have outpatient echocardiogram to assess for valvular disorder   Discharge Vitals Blood pressure 142/59, pulse 58, temperature 97.4 F (36.3 C), temperature source Oral, resp. rate 18, height 5\' 4"  (1.626 m), weight 151 lb 3.8 oz (68.6 kg), SpO2 96.00%.  Filed Weights   12/12/13 1019 12/13/13 0001  Weight: 150 lb (68.04 kg) 151 lb 3.8 oz (68.6 kg)    Labs  CBC  Recent Labs  12/13/13 0405    WBC 7.1  HGB 14.9  HCT 43.3  MCV 88.7  PLT 189   Basic Metabolic Panel  Recent Labs  12/13/13 0405  NA 138  K 4.0  CL 101  CO2 24  GLUCOSE 91  BUN 14  CREATININE 0.74  CALCIUM 9.4    Disposition  Pt is being discharged home today in good condition.  Follow-up Plans & Appointments      Follow-up Information   [facility] On 01/09/2014. (9:30 am)    Specialty:  Cardiology   Contact information:   [address] [place] [address] [phone]       Follow up with [facility] On 01/05/2014. (10:00am Outpatient echocardiogram)    Contact information:   [address] [place] [address] [phone]      Discharge Medications    Medication List         aspirin EC 81 MG tablet  Take 81 mg by mouth daily.     clopidogrel 75 MG tablet  Commonly known as:  PLAVIX  Take 1 tablet (75 mg total) by mouth daily with breakfast.     fluticasone 27.5 MCG/SPRAY nasal spray  Commonly known as:  VERAMYST  Place 1 spray into both nostrils daily.     loratadine 10 MG tablet  Commonly known as:  CLARITIN  Take 10 mg by mouth daily.  metoprolol tartrate 25 MG tablet  Commonly known as:  LOPRESSOR  Take 12.5 mg by mouth 2 (two) times daily.     multivitamin with minerals Tabs tablet  Take 1 tablet by mouth daily at 12 noon.     nitroGLYCERIN 0.4 MG SL tablet  Commonly known as:  NITROSTAT  Place 0.4 mg under the tongue every 5 (five) minutes as needed for chest pain.     rosuvastatin 20 MG tablet  Commonly known as:  CRESTOR  Take 20 mg by mouth at bedtime.        Outstanding Labs/Studies  Outpatient echocardiogram 01/05/2014 10:00am  Duration of Discharge Encounter   Greater than 30 minutes including physician time.  Ramond Dial PA 12/13/2013, 9:24 AM

## 2013-12-13 NOTE — Progress Notes (Signed)
CARDIAC REHAB PHASE I   PRE:  Rate/Rhythm: 74 SR  BP:  Supine: 142/59  Sitting:   Standing:    SaO2: 94%RA  MODE:  Ambulation: 500 ft   POST:  Rate/Rhythm: 88 SR  BP:  Supine:   Sitting: 141/66  Standing:    SaO2: 97%RA 0750-0855 Pt walked 500 ft with steady gait. C/o legs a little weak. Denied CP. Stated when he took a deep breath, a little chest pressure but not with walking. Education completed. Understanding voiced. Has attended [facility] before and gave permission for referral to [name]. Discussed NTG use and importance of plavix.   Luetta Nuttingharlene Dunlap, RN BSN  12/13/2013 8:53 AM

## 2013-12-13 NOTE — Progress Notes (Signed)
Patient Name: Matthew LopesJuan A Dickerman Date of Encounter: 12/13/2013     Principal Problem:   Angina, class III Active Problems:   CAD S/P percutaneous coronary angioplasty - PCI of RCA (12/12/2013)   HYPERLIPIDEMIA   HYPERTENSION, ESSENTIAL NOS   GERD    SUBJECTIVE  Denies any chest pain or SOB. No dizziness  CURRENT MEDS . aspirin EC  81 mg Oral Daily  . atorvastatin  40 mg Oral q1800  . clopidogrel  75 mg Oral Q breakfast  . fluticasone  1 spray Each Nare Daily  . loratadine  10 mg Oral Daily  . metoprolol tartrate  12.5 mg Oral BID  . multivitamin with minerals  1 tablet Oral Q1200  . sodium chloride  3 mL Intravenous Q12H    OBJECTIVE  Filed Vitals:   12/12/13 1910 12/12/13 2222 12/13/13 0001 12/13/13 0400  BP: 99/51 107/56 120/62 129/69  Pulse: 65 61 65 60  Temp:  97.4 F (36.3 C) 97.5 F (36.4 C) 97.4 F (36.3 C)  TempSrc:  Oral Oral Oral  Resp:   18 20  Height:      Weight:   151 lb 3.8 oz (68.6 kg)   SpO2: 96%  97% 94%    Intake/Output Summary (Last 24 hours) at 12/13/13 0712 Last data filed at 12/13/13 0550  Gross per 24 hour  Intake    409 ml  Output   1900 ml  Net  -1491 ml   Filed Weights   12/12/13 1019 12/13/13 0001  Weight: 150 lb (68.04 kg) 151 lb 3.8 oz (68.6 kg)    PHYSICAL EXAM  General: Pleasant, NAD. Neuro: Alert and oriented X 3. Moves all extremities spontaneously. Psych: Normal affect. HEENT:  Normal  Neck: Supple without bruits or JVD. Lungs:  Resp regular and unlabored, CTA. Heart: RRR no s3, s4. 2/6 systolic murmur Abdomen: Soft, non-tender, non-distended, BS + x 4.  Extremities: No clubbing, cyanosis or edema. DP/PT/Radials 2+ and equal bilaterally.  Accessory Clinical Findings  CBC  Recent Labs  12/13/13 0405  WBC 7.1  HGB 14.9  HCT 43.3  MCV 88.7  PLT 189   Basic Metabolic Panel  Recent Labs  12/13/13 0405  NA 138  K 4.0  CL 101  CO2 24  GLUCOSE 91  BUN 14  CREATININE 0.74  CALCIUM 9.4     TELE  NSR with HR 50-60s, no significant ventricular ectopy  ECG  Sinus bradycardia with HR 50s, no significant ST-T wave changes, poor R wave progression in anterior lead   Radiology/Studies  Dg Chest 2 View  11/22/2013   CLINICAL DATA:  Upper back pain, shortness of breath, former smoker, thoracic spine pain, dyspnea, hypertension, coronary artery disease  EXAM: CHEST  2 VIEW  COMPARISON:  10/14/2013  FINDINGS: Normal heart size post CABG.  Atherosclerotic calcification aorta.  Mediastinal contours and pulmonary vascularity normal.  Lungs clear.  No acute infiltrate, pleural effusion or pneumothorax.  Bones unremarkable.  IMPRESSION: No acute abnormalities.   Electronically Signed   By: Ulyses SouthwardMark  Boles M.D.   On: 11/22/2013 16:34   [place] Abdomen Complete  12/02/2013   CLINICAL DATA:  Elevated pancreatic enzymes with 10 lb weight loss over 1 year. History previous cholecystectomy and CABG  EXAM: ULTRASOUND ABDOMEN COMPLETE  COMPARISON:  Report of an abdominal and pelvic CT scan dated February 13, 2003.  FINDINGS: Gallbladder:  The gallbladder is surgically absent. There is no tenderness to palpation over the gallbladder fossa.  Common bile  duct:  Diameter: 5.41 mm  Liver:  There is a 1.5 cm hypoechoic focus adjacent to the IVC in the right hepatic lobe consistent with findings on a CT scan in 2004. This is most compatible with a hemangioma. There is no intrahepatic ductal dilation.  IVC:  No abnormality visualized.  Pancreas:  Bowel gas limited evaluation of the pancreatic head and tail. The visualized portions of the pancreatic body are normal.  Spleen:  Size and appearance within normal limits.  Right Kidney:  Length: 10.5 cm. Echogenicity within normal limits. No mass or hydronephrosis visualized.  Left Kidney:  Length: 11.7 cm. Echogenicity within normal limits. No mass or hydronephrosis visualized.  Abdominal aorta:  No aneurysm visualized.  Other findings:  No ascites is demonstrated.  IMPRESSION:  1. Evaluation of the pancreas is limited by bowel gas. Further evaluation with abdominal and pelvic CT scanning or pancreatic protocol MRI is recommended given the patient's clinical findings. 2. There is likely a hemangioma in the right lobe of the liver measuring 1.5 cm in greatest dimension. A similar lesion was described on the 2004 CT scan. 3. There is no acute abnormality demonstrated elsewhere within the abdomen.   Electronically Signed   By: David  SwazilandJordan   On: 12/02/2013 16:58    ASSESSMENT AND PLAN  1. Class III angina for past 6 wks  - totally occluded prox LAD filled by LIMA, SVG to Diag down, 95-99% stenosis distal to previous RCA stent treated with single DES, 80-90% stenosis in distal PLA, LV function 55-65%  - continue ASA, plavix, statin, metoprolol  - evaluate by cardiac rehab this morning and ambulate to see if improvement with class III angina  - if normal, possible discharge today  2. CAD s/p CABG 2003 (LIMA to LAD, SVG to Diag)   - cath 2008 stent to RCA, atretic SVG to diag with good antegrade flow  - myoview 04/2011 no ischemia and EF 62%   3. Heart murmur. Could be MR or AS.   - no significant MR on cath  - will schedule outpatient echo before next follow up  4. Elevated Amylase/lipase of unclear significance. Levels now back to normal. Asymptomatic. Abdominal CT planned depending on results of cardiac evaluation.   5. Hyperlipidemia- on statin   6. HTN- controlled.     Virgel PalingSigned, HARDING,DAVID W PA-C Pager: [phone]: [phone]  I have seen and evaluated the patient this AM along with Azalee CourseMeng, Hao PA-C. I agree with his findings, examination as well as impression recommendations.  Doing well s/p PCI of native RCA.  Has not yet walked -- will need to walk this Am prior to d/c.  Systolic Murmur heard on Exam - no recent Echo noted, will check OP Echo prior to ROV with [name] or APP.  OK for d/'c if stable ambulating this AM. Continue home meds + DAPT x minimum of 1  yr.   Marykay LexHARDING,DAVID W, M.D., M.S. Interventional Cardiologist   Pager # [phone] 12/13/2013

## 2013-12-19 ENCOUNTER — Ambulatory Visit (INDEPENDENT_AMBULATORY_CARE_PROVIDER_SITE_OTHER)
Admission: RE | Admit: 2013-12-19 | Discharge: 2013-12-19 | Disposition: A | Discharge status: Home / Self Care | Payer: Medicare Other | Source: Ambulatory Visit | Attending: Internal Medicine | Admitting: Internal Medicine

## 2013-12-19 DIAGNOSIS — R634 Abnormal weight loss: Secondary | ICD-10-CM

## 2013-12-19 DIAGNOSIS — R748 Abnormal levels of other serum enzymes: Secondary | ICD-10-CM

## 2013-12-19 MED ORDER — IOHEXOL 300 MG/ML  SOLN
100.0000 mL | Freq: Once | INTRAMUSCULAR | Status: AC | PRN
  Administered 2013-12-19: 100 mL via INTRAVENOUS

## 2013-12-22 ENCOUNTER — Encounter: Payer: Self-pay | Admitting: Internal Medicine

## 2013-12-23 NOTE — Telephone Encounter (Signed)
Patient walked in office Monday 12/19/13.Patient stated he wanted to change care to Dr.Jordan.Patient stated he was feeling good.Advised to keep post hospital appointment with Nada BoozerLaura Ingold NP 01/09/14 at 9:30 am.

## 2013-12-28 ENCOUNTER — Other Ambulatory Visit: Payer: Self-pay | Admitting: Internal Medicine

## 2013-12-28 DIAGNOSIS — G4750 Parasomnia, unspecified: Secondary | ICD-10-CM

## 2014-01-02 ENCOUNTER — Encounter: Payer: Self-pay | Admitting: Neurology

## 2014-01-02 ENCOUNTER — Ambulatory Visit (INDEPENDENT_AMBULATORY_CARE_PROVIDER_SITE_OTHER): Payer: Medicare Other | Admitting: Neurology

## 2014-01-02 VITALS — BP 107/63 | HR 69 | Resp 17 | Ht 65.0 in | Wt 156.0 lb

## 2014-01-02 DIAGNOSIS — G4710 Hypersomnia, unspecified: Secondary | ICD-10-CM

## 2014-01-02 DIAGNOSIS — R0609 Other forms of dyspnea: Secondary | ICD-10-CM

## 2014-01-02 DIAGNOSIS — R0989 Other specified symptoms and signs involving the circulatory and respiratory systems: Secondary | ICD-10-CM

## 2014-01-02 DIAGNOSIS — R0683 Snoring: Secondary | ICD-10-CM

## 2014-01-02 DIAGNOSIS — G4750 Parasomnia, unspecified: Secondary | ICD-10-CM
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
## 2014-01-02 NOTE — Patient Instructions (Signed)
Polisomnografa (estudios del sueo) (Polysomnography (Sleep Studies)) La polisomnografa (PSG) comprende una serie de estudios que se usan para Engineer, manufacturingdetectar (diagnosticar) la apnea obstructiva del sueo y otros trastornos del sueo. Los estudios miden el funcionamiento de Environmental manageralgunas partes del organismo mientras usted duerme. Son Dorthy Coolerestudios exhaustivos y costosos. Se realizan en un laboratorio del sueo o un hospital y varan de un centro a otro. Su mdico puede realizar otros estudios del sueo ms simples y cuestionarios antes de optar por los ms completos y complejos. Es posible que no tengan Dominicacobertura del seguro. Algunos de CHS Incestos estudios son:  Un EEG (electroencefalograma), que estudia las ondas cerebrales y las etapas del sueo.  Una EOG (electrooculografa), que International Paperanaliza los movimientos de los ojos. Detecta los perodos de sueo MOR (movimientos oculares rpidos) que es el sueo [place]ivo.  Un ECG (electrocardiograma), que mide el ritmo cardaco.  Una EMG (electromiograma), que es una medicin de la forma en que funcionan los msculos de las vas respiratorias superiores y las piernas mientras duerme.  Una medicin de la oximetra, que determina la cantidad de oxgeno (aire) que recibe mientras duerme.  Se pueden medir los esfuerzos respiratorios. Diferentes mdicos y centros que estudian el sueo pueden interpretar (entender) el mismo estudio de distinto modo.  Los estudios pueden incluir un ndice de apnea-hipopnea (IAH), un nmero que se calcula al contar las veces que no hay respiracin o que disminuye el flujo respiratorio durante la noche, y Web designerrelacionar esos nmeros con el tiempo que se pasa en la cama. Cuando el IAH es de ms de 15, es probable que el paciente se queje de somnolencia diurna. Cuando es de ms de30, el paciente corre un riesgo mayor de tener problemas cardacos, y se Industrial/product designerle debe hacer un seguimiento ms exhaustivo. El seguimiento del IAH tambin le permite saber cmo funciona el  tratamiento. Se puede usar una oximetra simple (un control de la cantidad de oxgeno que se inhala) para evaluar a los siguientes pacientes:  Los que no tienen sntomas (problemas) de apnea obstructiva del sueo.  Los que tienen una puntuacin normal en la [place] de somnolencia de Epworth.  Los que tienen una baja probabilidad previa al estudio de tener apnea obstructiva del sueo.  Los que no tienen ninguno de los problemas de las vas respiratorias superiores que probablemente causen apnea.  Tambin se [place]ausa la oximetra para determinar si el tratamiento es Engineer, manufacturingeficaz en los pacientes que mostraron desaturaciones significativas (no reciben suficiente oxgeno) en los estudios de Animatorsueo en el hogar. Roxanne GatesUna medida de seguridad adicional es Education officer, environmentalrealizar ms estudios a la persona que solo ronca, porque nadie puede predecir con absoluta certeza quines tendrn apnea obstructiva del sueo. Es recomendable que las personas con desaturaciones significativas (no reciben suficiente oxgeno) se hagan un estudio del sueo ms detallado. Document Released: 03/23/2013 [facility] Patient Information 2015 [employer], [place]. This information is not intended to replace advice given to you by your health care provider. Make sure you discuss any questions you have with your health care provider.

## 2014-01-05 ENCOUNTER — Ambulatory Visit (HOSPITAL_COMMUNITY)
Admit: 2014-01-05 | Discharge: 2014-01-05 | Disposition: A | Discharge status: Home / Self Care | Payer: Medicare Other | Source: Ambulatory Visit | Attending: Cardiology | Admitting: Cardiology

## 2014-01-05 DIAGNOSIS — R011 Cardiac murmur, unspecified: Secondary | ICD-10-CM | POA: Diagnosis not present

## 2014-01-05 DIAGNOSIS — R002 Palpitations: Secondary | ICD-10-CM

## 2014-01-05 DIAGNOSIS — I359 Nonrheumatic aortic valve disorder, unspecified: Secondary | ICD-10-CM

## 2014-01-05 NOTE — Progress Notes (Signed)
2D Echo Performed 01/05/2014    Briel Gallicchio, RCS  

## 2014-01-09 ENCOUNTER — Encounter: Payer: Self-pay | Admitting: Cardiology

## 2014-01-09 ENCOUNTER — Ambulatory Visit (INDEPENDENT_AMBULATORY_CARE_PROVIDER_SITE_OTHER): Payer: Medicare Other | Admitting: Cardiology

## 2014-01-09 VITALS — BP 100/68 | HR 76 | Ht 65.0 in | Wt 156.5 lb

## 2014-01-09 DIAGNOSIS — I10 Essential (primary) hypertension: Secondary | ICD-10-CM

## 2014-01-09 DIAGNOSIS — I359 Nonrheumatic aortic valve disorder, unspecified: Secondary | ICD-10-CM

## 2014-01-09 DIAGNOSIS — Z9861 Coronary angioplasty status: Secondary | ICD-10-CM

## 2014-01-09 DIAGNOSIS — I2510 Atherosclerotic heart disease of native coronary artery without angina pectoris: Secondary | ICD-10-CM

## 2014-01-09 DIAGNOSIS — I351 Nonrheumatic aortic (valve) insufficiency: Secondary | ICD-10-CM

## 2014-01-09 DIAGNOSIS — E782 Mixed hyperlipidemia: Secondary | ICD-10-CM

## 2014-01-09 MED ORDER — CLOPIDOGREL BISULFATE 75 MG PO TABS: 75.0000 mg | ORAL_TABLET | Freq: Every day | ORAL | Status: DC

## 2014-01-09 NOTE — Patient Instructions (Signed)
Your physician has requested that you have an echocardiogram. Echocardiography is a painless test that uses sound waves to create images of your heart. It provides your doctor with information about the size and shape of your heart and how well your heart's chambers and valves are working. There are no restrictions for this procedure.  Your physician recommends that you schedule a follow-up appointment in 2 months with Dr SwazilandJordan.

## 2014-01-09 NOTE — Assessment & Plan Note (Signed)
Murmur present , on Echo no doppler of aortic valve to obtain gradients, have asked pt to have limited study, not sure why it was not done.  He will follow up with [name] in 2-3 months.

## 2014-01-09 NOTE — Assessment & Plan Note (Signed)
controlled 

## 2014-01-09 NOTE — Assessment & Plan Note (Signed)
Continue DAPT for at least 1 year.  No further complaints

## 2014-01-09 NOTE — Progress Notes (Signed)
01/09/2014   PCP: Marga MelnickWilliam Hopper, MD   Chief Complaint  Patient presents with  . Hospitalization Follow-up    Patient reports shortness of breath-getting better, hip/back pain when walking. Patient had echo done-would like results.    Primary Cardiologist: [name]  HPI:  79 year old Caucasian male with past medical history significant for hypertension, hyperlipidemia and history of coronary disease status post CABG in 2003 with LIMA to LAD, SVG to diagonal. He was seen by [name] in the clinic on 12/09/2013 at which time he complained of having chest discomfort and pain in his upper back  with minimal exertion for the past 6 weeks. He was admitted and cardiac cath was done. Results below. Final Conclusions:  1. Severe 2 vessel coronary artery disease with severe stenosis of the RCA and total occlusion of the proximal LAD  2. Status post aortocoronary bypass surgery with continued patency of the LIMA to LAD and total occlusion of the vein graft to diagonal  3. Preserved LV systolic function  4. Successful PCI of the right coronary artery using a single drug-eluting stent.   Echo was done as outpt. To eval murmur.  His doppler of aortic valve not done.  EF was normal at 55-60%.    Today he has no complaints, feels better no chest pain and no back pain.  No SOB.  We reviewed his meds and echo.    Allergies  Allergen Reactions  . Codeine Nausea Only    Current Outpatient Prescriptions  Medication Sig Dispense Refill  . aspirin EC 81 MG tablet Take 81 mg by mouth daily.      . clopidogrel (PLAVIX) 75 MG tablet Take 1 tablet (75 mg total) by mouth daily with breakfast.  90 tablet  3  . fluticasone (VERAMYST) 27.5 MCG/SPRAY nasal spray Place 1 spray into both nostrils daily.      [name]. loratadine (CLARITIN) 10 MG tablet Take 10 mg by mouth daily.      . metoprolol tartrate (LOPRESSOR) 25 MG tablet Take 12.5 mg by mouth 2 (two) times daily.       . Multiple Vitamin  (MULTIVITAMIN WITH MINERALS) TABS tablet Take 1 tablet by mouth daily at 12 noon.      . nitroGLYCERIN (NITROSTAT) 0.4 MG SL tablet Place 0.4 mg under the tongue every 5 (five) minutes as needed for chest pain.      . rosuvastatin (CRESTOR) 20 MG tablet Take 20 mg by mouth at bedtime.        No current facility-administered medications for this visit.    Past Medical History  Diagnosis Date  . Hyperlipidemia   . Hypertension   . Diverticulosis   . Gilbert's syndrome   . Osteopenia 2010     T score -1.9 @ hip (femoral neck)  . CAD S/P percutaneous coronary angioplasty 2003    Dr Antoine PocheHochrein: 2003: a) s/p CABG (LIMA-LAD, SVG-Diag);b) 2008: prior PCI to native RCA; c) 12/12/2013: SVG-Diag occluded, patent LIMA, 95% post stent in RCA --> PCI  with 2.75 x 18 mm Xience Alpine DES, residual dRCA-PLA 80=-90%, EF 55-65%  . PUD (peptic ulcer disease)     PMH , ? 1995  . Anginal pain   . Heart murmur   . Shortness of breath   . Short-term memory loss   . GERD (gastroesophageal reflux disease)   . Arthritis     BACK & SHOULDERS    Past Surgical History  Procedure  Laterality Date  . Hemorrhoid surgery    . Cholecystectomy  2004  . Cervical fusion  1997    Dr Jeral Fruit  . Coronary artery bypass graft  2003    Last catheterization was in August 2008 demonstrated a LIMA to th LAD which was patent, there was an atretic saphenous vein graft to the diagonal, the LAD had a 90% stenosis in the large calcified segment, the diagonal has ostial 70% stenosis, the circumflex had a ramus intermediate with ostial 25% stenosis, the right coronary artery was dominant.  There was an ulcerated 80%-90% stenosis.    . Tonsillectomy and adenoidectomy    . Coronary stent placement  2008    drug-eluting stent to right coronary artery  . Upper gastrointestinal endoscopy       Dr Victorino Dike  . Colonoscopy      Dr Victorino Dike  . Coronary stent placement Right 12/12/2013    RT CORONARY  DES       DR COOPER      ZOX:WRUEAVW:UJ colds or fevers, no weight changes Skin:no rashes or ulcers HEENT:no blurred vision, no congestion CV:see HPI PUL:see HPI GI:no diarrhea constipation or melena, no indigestion GU:no hematuria, no dysuria MS:no joint pain, no claudication Neuro:no syncope, no lightheadedness Endo:no diabetes, no thyroid disease  Wt Readings from Last 3 Encounters:  01/09/14 156 lb 8 oz (70.988 kg)  01/02/14 156 lb (70.761 kg)  12/13/13 151 lb 3.8 oz (68.6 kg)   Lipid Panel     Component Value Date/Time   CHOL 134 10/14/2013 1030   TRIG 69.0 10/14/2013 1030   HDL 48.10 10/14/2013 1030   CHOLHDL 3 10/14/2013 1030   VLDL 13.8 10/14/2013 1030   LDLCALC 72 10/14/2013 1030     PHYSICAL EXAM BP 100/68  Pulse 76  Ht 5\' 5"  (1.651 m)  Wt 156 lb 8 oz (70.988 kg)  BMI 26.04 kg/m2 General:Pleasant affect, NAD Skin:Warm and dry, brisk capillary refill HEENT:normocephalic, sclera clear, mucus membranes moist Neck:supple, no JVD, no bruits  Heart:S1S2 RRR with 2/6 aortic outflow murmur,no  gallup, rub or click Lungs:clear without rales, rhonchi, or wheezes WJX:BJYN, non tender, + BS, do not palpate liver spleen or masses Ext:no lower ext edema, 2+ pedal pulses, 2+ radial pulses- lt cath site without hematoma, did have ecchymosis now resolved.   Neuro:alert and oriented, MAE, follows commands, + facial symmetry  EKG:pt refused EKG  ASSESSMENT AND PLAN CAD S/P percutaneous coronary angioplasty - PCI of RCA (12/12/2013) Continue DAPT for at least 1 year.  No further complaints  Aortic regurgitation Murmur present , on Echo no doppler of aortic valve to obtain gradients, have asked pt to have limited study, not sure why it was not done.  He will follow up with Dr. Swaziland in 2-3 months.  HYPERLIPIDEMIA controlled  HYPERTENSION, ESSENTIAL NOS controlled

## 2014-01-13 ENCOUNTER — Telehealth: Payer: Self-pay

## 2014-01-13 NOTE — Telephone Encounter (Signed)
Patient called no answer.Left message on personal voice mail recent echo normal LV function.Keep follow up appointment with Dr.Jordan 03/16/14 at 9:45 am.Advised to call sooner if needed.

## 2014-01-15 ENCOUNTER — Emergency Department (HOSPITAL_BASED_OUTPATIENT_CLINIC_OR_DEPARTMENT_OTHER)
Admission: EM | Admit: 2014-01-15 | Discharge: 2014-01-15 | Disposition: A | Discharge status: Home / Self Care | Payer: Medicare Other | Attending: Emergency Medicine | Admitting: Emergency Medicine

## 2014-01-15 ENCOUNTER — Emergency Department (HOSPITAL_BASED_OUTPATIENT_CLINIC_OR_DEPARTMENT_OTHER): Payer: Medicare Other

## 2014-01-15 ENCOUNTER — Encounter (HOSPITAL_BASED_OUTPATIENT_CLINIC_OR_DEPARTMENT_OTHER): Payer: Self-pay | Admitting: Emergency Medicine

## 2014-01-15 DIAGNOSIS — R42 Dizziness and giddiness: Secondary | ICD-10-CM | POA: Diagnosis not present

## 2014-01-15 DIAGNOSIS — Z7902 Long term (current) use of antithrombotics/antiplatelets: Secondary | ICD-10-CM | POA: Diagnosis not present

## 2014-01-15 DIAGNOSIS — R197 Diarrhea, unspecified: Secondary | ICD-10-CM | POA: Diagnosis not present

## 2014-01-15 DIAGNOSIS — R110 Nausea: Secondary | ICD-10-CM | POA: Insufficient documentation

## 2014-01-15 DIAGNOSIS — Z951 Presence of aortocoronary bypass graft: Secondary | ICD-10-CM | POA: Diagnosis not present

## 2014-01-15 DIAGNOSIS — Z8719 Personal history of other diseases of the digestive system: Secondary | ICD-10-CM | POA: Diagnosis not present

## 2014-01-15 DIAGNOSIS — E785 Hyperlipidemia, unspecified: Secondary | ICD-10-CM | POA: Diagnosis not present

## 2014-01-15 DIAGNOSIS — I209 Angina pectoris, unspecified: Secondary | ICD-10-CM | POA: Diagnosis not present

## 2014-01-15 DIAGNOSIS — I10 Essential (primary) hypertension: Secondary | ICD-10-CM | POA: Insufficient documentation

## 2014-01-15 DIAGNOSIS — Z7982 Long term (current) use of aspirin: Secondary | ICD-10-CM | POA: Diagnosis not present

## 2014-01-15 DIAGNOSIS — Z9861 Coronary angioplasty status: Secondary | ICD-10-CM | POA: Diagnosis not present

## 2014-01-15 DIAGNOSIS — Z87891 Personal history of nicotine dependence: Secondary | ICD-10-CM | POA: Insufficient documentation

## 2014-01-15 DIAGNOSIS — M129 Arthropathy, unspecified: Secondary | ICD-10-CM | POA: Diagnosis not present

## 2014-01-15 DIAGNOSIS — R011 Cardiac murmur, unspecified: Secondary | ICD-10-CM | POA: Insufficient documentation

## 2014-01-15 DIAGNOSIS — I2510 Atherosclerotic heart disease of native coronary artery without angina pectoris: Secondary | ICD-10-CM | POA: Diagnosis not present

## 2014-01-15 DIAGNOSIS — IMO0002 Reserved for concepts with insufficient information to code with codable children: Secondary | ICD-10-CM | POA: Diagnosis not present

## 2014-01-15 LAB — COMPREHENSIVE METABOLIC PANEL
ALT: 20 U/L (ref 0–53)
AST: 21 U/L (ref 0–37)
Albumin: 4.5 g/dL (ref 3.5–5.2)
Alkaline Phosphatase: 71 U/L (ref 39–117)
Anion gap: 14 (ref 5–15)
BILIRUBIN TOTAL: 0.8 mg/dL (ref 0.3–1.2)
BUN: 15 mg/dL (ref 6–23)
CALCIUM: 10.2 mg/dL (ref 8.4–10.5)
CHLORIDE: 98 meq/L (ref 96–112)
CO2: 26 meq/L (ref 19–32)
Creatinine, Ser: 0.7 mg/dL (ref 0.50–1.35)
GFR, EST NON AFRICAN AMERICAN: 89 mL/min — AB (ref >90)
GLUCOSE: 121 mg/dL — AB (ref 70–99)
Potassium: 4.2 mEq/L (ref 3.7–5.3)
SODIUM: 138 meq/L (ref 137–147)
Total Protein: 8 g/dL (ref 6.0–8.3)

## 2014-01-15 LAB — URINALYSIS, ROUTINE W REFLEX MICROSCOPIC
Bilirubin Urine: NEGATIVE
Glucose, UA: NEGATIVE mg/dL
Hgb urine dipstick: NEGATIVE
KETONES UR: NEGATIVE mg/dL
Leukocytes, UA: NEGATIVE
NITRITE: NEGATIVE
Protein, ur: NEGATIVE mg/dL
Specific Gravity, Urine: 1.02 (ref 1.005–1.030)
UROBILINOGEN UA: 1 mg/dL (ref 0.0–1.0)
pH: 7.5 (ref 5.0–8.0)

## 2014-01-15 LAB — CBC
HCT: 45.5 % (ref 39.0–52.0)
HEMOGLOBIN: 16.2 g/dL (ref 13.0–17.0)
MCH: 31.1 pg (ref 26.0–34.0)
MCHC: 35.6 g/dL (ref 30.0–36.0)
MCV: 87.3 fL (ref 78.0–100.0)
Platelets: 200 10*3/uL (ref 150–400)
RBC: 5.21 MIL/uL (ref 4.22–5.81)
RDW: 12.5 % (ref 11.5–15.5)
WBC: 9.5 10*3/uL (ref 4.0–10.5)

## 2014-01-15 LAB — TROPONIN I

## 2014-01-15 LAB — PROTIME-INR
INR: 0.98 (ref 0.00–1.49)
Prothrombin Time: 13 seconds (ref 11.6–15.2)

## 2014-01-15 LAB — APTT: aPTT: 33 seconds (ref 24–37)

## 2014-01-15 MED ORDER — SODIUM CHLORIDE 0.9 % IV BOLUS (SEPSIS)
500.0000 mL | Freq: Once | INTRAVENOUS | Status: AC
  Administered 2014-01-15: 500 mL via INTRAVENOUS

## 2014-01-15 MED ORDER — MECLIZINE HCL 50 MG PO TABS: 25.0000 mg | ORAL_TABLET | Freq: Three times a day (TID) | ORAL | Status: DC | PRN

## 2014-01-15 NOTE — Discharge Instructions (Signed)

## 2014-01-15 NOTE — ED Notes (Addendum)
Per family, patient c/o   feeling dizzy around 9:30 this morning,  C/o chills, Loose BM twice this morning, c/o neck & shoulder discomfort and leg weakness

## 2014-01-15 NOTE — ED Provider Notes (Signed)
CSN: [id number]     Arrival date & time 01/15/14  1052 History   First MD Initiated Contact with Patient 01/15/14 1204     Chief Complaint  Patient presents with  . Dizziness     (Consider location/radiation/quality/duration/timing/severity/associated sxs/prior Treatment) Patient is a 79 y.o. male presenting with dizziness. The history is provided by the patient.  Dizziness Quality:  Vertigo Severity: mild to mod. Onset quality:  Sudden Timing:  Constant Progression:  Improving Chronicity:  Recurrent Context comment:  Upon awakening this morning Relieved by:  Being still Worsened by:  Movement and turning head Ineffective treatments:  None tried Associated symptoms: diarrhea (one episode) and nausea   Associated symptoms: no chest pain, no headaches, no shortness of breath and no vomiting     Past Medical History  Diagnosis Date  . Hyperlipidemia   . Hypertension   . Diverticulosis   . Gilbert's syndrome   . Osteopenia 2010     T score -1.9 @ hip (femoral neck)  . CAD S/P percutaneous coronary angioplasty 2003    Dr Antoine Poche: 2003: a) s/p CABG (LIMA-LAD, SVG-Diag);b) 2008: prior PCI to native RCA; c) 12/12/2013: SVG-Diag occluded, patent LIMA, 95% post stent in RCA --> PCI  with 2.75 x 18 mm Xience Alpine DES, residual dRCA-PLA 80=-90%, EF 55-65%  . PUD (peptic ulcer disease)     PMH , ? 1995  . Anginal pain   . Heart murmur   . Shortness of breath   . Short-term memory loss   . GERD (gastroesophageal reflux disease)   . Arthritis     BACK & SHOULDERS   Past Surgical History  Procedure Laterality Date  . Hemorrhoid surgery    . Cholecystectomy  2004  . Cervical fusion  1997    Dr Jeral Fruit  . Coronary artery bypass graft  2003    Last catheterization was in August 2008 demonstrated a LIMA to th LAD which was patent, there was an atretic saphenous vein graft to the diagonal, the LAD had a 90% stenosis in the large calcified segment, the diagonal has ostial 70% stenosis,  the circumflex had a ramus intermediate with ostial 25% stenosis, the right coronary artery was dominant.  There was an ulcerated 80%-90% stenosis.    . Tonsillectomy and adenoidectomy    . Coronary stent placement  2008    drug-eluting stent to right coronary artery  . Upper gastrointestinal endoscopy       Dr Victorino Dike  . Colonoscopy      Dr Victorino Dike  . Coronary stent placement Right 12/12/2013    RT CORONARY  DES       DR COOPER    Family History  Problem Relation Age of Onset  . Cancer Father     ? primary  . Uterine cancer Daughter   . Hypertension Maternal Grandmother   . Osteoporosis Sister   . Diabetes Sister   . Stroke Neg Hx   . Peripheral vascular disease Maternal Grandmother   . Heart attack Neg Hx    History  Substance Use Topics  . Smoking status: Former Smoker    Quit date: 02/25/1979  . Smokeless tobacco: Never Used     Comment: smoked 1955-1980, up to 1 ppd  . Alcohol Use: Yes    Review of Systems  Constitutional: Negative for fever.  HENT: Negative for drooling and rhinorrhea.   Eyes: Negative for pain.  Respiratory: Negative for cough and shortness of breath.   Cardiovascular: Negative for  chest pain and leg swelling.  Gastrointestinal: Positive for nausea and diarrhea (one episode). Negative for vomiting and abdominal pain.  Genitourinary: Negative for dysuria and hematuria.  Musculoskeletal: Negative for gait problem and neck pain.  Skin: Negative for color change.  Neurological: Positive for dizziness. Negative for numbness and headaches.  Hematological: Negative for adenopathy.  Psychiatric/Behavioral: Negative for behavioral problems.  All other systems reviewed and are negative.     Allergies  Codeine  Home Medications   Prior to Admission medications   Medication Sig Start Date End Date Taking? Authorizing Provider  aspirin EC 81 MG tablet Take 81 mg by mouth daily.    Historical Provider, MD  clopidogrel (PLAVIX) 75 MG tablet  Take 1 tablet (75 mg total) by mouth daily with breakfast. 01/09/14   Nada Boozer, NP  fluticasone (VERAMYST) 27.5 MCG/SPRAY nasal spray Place 1 spray into both nostrils daily.    Historical Provider, MD  loratadine (CLARITIN) 10 MG tablet Take 10 mg by mouth daily.    Historical Provider, MD  metoprolol tartrate (LOPRESSOR) 25 MG tablet Take 12.5 mg by mouth 2 (two) times daily.     Historical Provider, MD  Multiple Vitamin (MULTIVITAMIN WITH MINERALS) TABS tablet Take 1 tablet by mouth daily at 12 noon.    Historical Provider, MD  nitroGLYCERIN (NITROSTAT) 0.4 MG SL tablet Place 0.4 mg under the tongue every 5 (five) minutes as needed for chest pain.    Historical Provider, MD  rosuvastatin (CRESTOR) 20 MG tablet Take 20 mg by mouth at bedtime.     Historical Provider, MD   BP 146/58  Pulse 67  Temp(Src) 97.4 F (36.3 C) (Oral)  Resp 18  Wt 152 lb (68.947 kg)  SpO2 100% Physical Exam  Nursing note and vitals reviewed. Constitutional: He is oriented to person, place, and time. He appears well-developed and well-nourished.  HENT:  Head: Normocephalic and atraumatic.  Right Ear: External ear normal.  Left Ear: External ear normal.  Nose: Nose normal.  Mouth/Throat: Oropharynx is clear and moist. No oropharyngeal exudate.  Eyes: Conjunctivae and EOM are normal. Pupils are equal, round, and reactive to light.  Neck: Normal range of motion. Neck supple.  Cardiovascular: Normal rate, regular rhythm, normal heart sounds and intact distal pulses.  Exam reveals no gallop and no friction rub.   No murmur heard. Pulmonary/Chest: Effort normal and breath sounds normal. No respiratory distress. He has no wheezes.  Abdominal: Soft. Bowel sounds are normal. He exhibits no distension. There is no tenderness. There is no rebound and no guarding.  Musculoskeletal: Normal range of motion. He exhibits no edema and no tenderness.  Neurological: He is alert and oriented to person, place, and time.  alert,  oriented x3 speech: normal in context and clarity memory: intact grossly cranial nerves II-XII: intact motor strength: full proximally and distally no involuntary movements or tremors sensation: intact to light touch diffusely  cerebellar: finger-to-nose and heel-to-shin intact gait: normal forwards and backwards, normal tandem gait   Skin: Skin is warm and dry.  Psychiatric: He has a normal mood and affect. His behavior is normal.    ED Course  Procedures (including critical care time) Labs Review Labs Reviewed  COMPREHENSIVE METABOLIC PANEL - Abnormal; Notable for the following:    Glucose, Bld 121 (*)    GFR calc non Af Amer 89 (*)    All other components within normal limits  CBC  PROTIME-INR  APTT  TROPONIN I  URINALYSIS, ROUTINE W REFLEX MICROSCOPIC  Imaging Review Dg Chest 2 View  01/15/2014   CLINICAL DATA:  Chest pain and dizziness.  EXAM: CHEST  2 VIEW  COMPARISON:  Two-view chest 6/9/ 15.  FINDINGS: The heart size is normal. Atherosclerotic calcifications are present in the aortic arch. Emphysematous changes are noted. No focal airspace disease is present. The visualized soft tissues and bony thorax are unremarkable.  IMPRESSION: No acute cardiopulmonary disease or significant interval change.   Electronically Signed   By: Gennette Pachris  Mattern M.D.   On: 01/15/2014 13:17   Ct Head Wo Contrast  01/15/2014   CLINICAL DATA:  Dizziness with movement.  EXAM: CT HEAD WITHOUT CONTRAST  TECHNIQUE: Contiguous axial images were obtained from the base of the skull through the vertex without intravenous contrast.  COMPARISON:  CT head without contrast 10/26/2013.  FINDINGS: No acute infarct, hemorrhage, or mass lesion is present. The ventricles are of normal size. No significant extraaxial fluid collection is present. Minimal subcortical white matter changes are similar to the prior study.  Mild mucosal thickening is present in the left maxillary sinus. There is mucosal thickening  throughout the ethmoid air cells and right sphenoid sinus. The frontal sinuses are clear. The mastoid air cells are clear. The osseous skull is intact. Atherosclerotic calcifications are present within the cavernous carotid arteries bilaterally.  IMPRESSION: 1. No acute intracranial abnormality or significant interval change. 2. Minimal white matter disease is stable, likely within normal limits for age. 3. Atherosclerosis. 4. Mild sinus disease as described.   Electronically Signed   By: Gennette Pachris  Mattern M.D.   On: 01/15/2014 13:20     EKG Interpretation   Date/Time:  Sunday January 15 2014 11:19:09 EDT Ventricular Rate:  62 PR Interval:  154 QRS Duration: 80 QT Interval:  422 QTC Calculation: 428 R Axis:   49 Text Interpretation:  Normal sinus rhythm Possible Anteroseptal infarct ,  age undetermined non-spec t wave change in V2 Confirmed by Criston Chancellor  MD,  Harlo Jaso ([id number]) on 01/15/2014 11:24:16 AM      MDM   Final diagnoses:  Dizziness    12:07 PM 79 y.o. male who awoke w/ dizziness this morning around 8:00 AM. He states that he only has symptoms when he is up and moving. He has a normal neurologic exam here. He has a history of vertigo and this feels similar, slightly worse. His vital signs are unremarkable. Will get screening labs and imaging.   3:37 PM: I interpreted/reviewed the labs and/or imaging which were non-contributory. Pt feeling much better on exam, no longer dizzy. No ataxia on repeat gait exam. Likely pt's vertigo. I have discussed the diagnosis/risks/treatment options with the patient and believe the pt to be eligible for discharge home to follow-up with his pcp for vertigo. We also discussed returning to the ED immediately if new or worsening sx occur. We discussed the sx which are most concerning (e.g., worsening dizziness, weakness, numbness) that necessitate immediate return. Medications administered to the patient during their visit and any new prescriptions provided to the  patient are listed below.  Medications given during this visit Medications  sodium chloride 0.9 % bolus 500 mL (500 mLs Intravenous New Bag/Given 01/15/14 1233)    Discharge Medication List as of 01/15/2014  3:38 PM         Randa SpikeForrest Mort SawyersS Jessika Rothery, MD 01/16/14 [phone]

## 2014-01-16 ENCOUNTER — Ambulatory Visit (HOSPITAL_COMMUNITY)
Admission: RE | Admit: 2014-01-16 | Discharge: 2014-01-16 | Disposition: A | Discharge status: Home / Self Care | Payer: Medicare Other | Source: Ambulatory Visit | Attending: Cardiovascular Disease | Admitting: Cardiovascular Disease

## 2014-01-16 DIAGNOSIS — I351 Nonrheumatic aortic (valve) insufficiency: Secondary | ICD-10-CM

## 2014-01-20 ENCOUNTER — Ambulatory Visit: Payer: Medicare Other | Admitting: Cardiology

## 2014-01-23 ENCOUNTER — Telehealth: Payer: Self-pay | Admitting: Cardiology

## 2014-01-23 MED ORDER — METOPROLOL TARTRATE 25 MG PO TABS: 12.5000 mg | ORAL_TABLET | Freq: Two times a day (BID) | ORAL | Status: DC

## 2014-01-23 NOTE — Telephone Encounter (Signed)
Refill Request   Pt is requesting a refill of metoprolol tartrate (LOPRESSOR) 25 MG tablet to [facility] on [address].

## 2014-01-23 NOTE — Telephone Encounter (Signed)
Rx was sent to pharmacy electronically. 

## 2014-01-24 ENCOUNTER — Other Ambulatory Visit: Payer: Self-pay | Admitting: Cardiology

## 2014-01-26 ENCOUNTER — Other Ambulatory Visit: Payer: Self-pay | Admitting: *Deleted

## 2014-01-27 ENCOUNTER — Telehealth: Payer: Self-pay | Admitting: Cardiology

## 2014-01-27 MED ORDER — METOPROLOL TARTRATE 25 MG PO TABS: 12.5000 mg | ORAL_TABLET | Freq: Two times a day (BID) | ORAL | Status: DC

## 2014-01-27 NOTE — Telephone Encounter (Signed)
Pt called in stating that he needs his metoprolol refilled for a quantity of 90 and he needs it sent to [facility] on [address]. Thanks

## 2014-01-27 NOTE — Telephone Encounter (Signed)
Rx was sent to pharmacy electronically. 

## 2014-02-06 ENCOUNTER — Encounter: Payer: Self-pay | Admitting: Neurology

## 2014-02-06 ENCOUNTER — Ambulatory Visit (INDEPENDENT_AMBULATORY_CARE_PROVIDER_SITE_OTHER): Payer: Medicare Other | Admitting: Neurology

## 2014-02-06 DIAGNOSIS — G4750 Parasomnia, unspecified: Secondary | ICD-10-CM

## 2014-02-06 DIAGNOSIS — G4733 Obstructive sleep apnea (adult) (pediatric): Secondary | ICD-10-CM

## 2014-02-06 DIAGNOSIS — R0683 Snoring: Secondary | ICD-10-CM

## 2014-02-06 DIAGNOSIS — G4710 Hypersomnia, unspecified: Secondary | ICD-10-CM

## 2014-02-06 DIAGNOSIS — R0902 Hypoxemia: Secondary | ICD-10-CM

## 2014-02-16 ENCOUNTER — Other Ambulatory Visit (HOSPITAL_COMMUNITY): Payer: Self-pay | Admitting: Cardiology

## 2014-02-16 ENCOUNTER — Ambulatory Visit (HOSPITAL_COMMUNITY): Payer: Medicare Other | Attending: Cardiology | Admitting: Radiology

## 2014-02-16 DIAGNOSIS — I359 Nonrheumatic aortic valve disorder, unspecified: Secondary | ICD-10-CM

## 2014-02-16 DIAGNOSIS — R0989 Other specified symptoms and signs involving the circulatory and respiratory systems: Secondary | ICD-10-CM

## 2014-02-16 NOTE — Progress Notes (Signed)
Limited Echo to Assess Aortic Valve Function.

## 2014-02-22 ENCOUNTER — Other Ambulatory Visit: Payer: Self-pay | Admitting: General Surgery

## 2014-02-22 DIAGNOSIS — I351 Nonrheumatic aortic (valve) insufficiency: Secondary | ICD-10-CM

## 2014-02-24 ENCOUNTER — Telehealth: Payer: Self-pay | Admitting: *Deleted

## 2014-02-24 NOTE — Telephone Encounter (Signed)
Called pt and informed him of his sleep results which revelaed OSA.  Pt was instructed that we would be sending a copy of his results to the referring MD, Marga Melnick as well as him.  Pt informed that info would be provided to a DME company and they would be in contact with him soon.

## 2014-02-28 ENCOUNTER — Other Ambulatory Visit: Payer: Self-pay | Admitting: Neurology

## 2014-02-28 ENCOUNTER — Encounter: Payer: Self-pay | Admitting: Neurology

## 2014-02-28 DIAGNOSIS — G4733 Obstructive sleep apnea (adult) (pediatric): Secondary | ICD-10-CM

## 2014-03-16 ENCOUNTER — Ambulatory Visit (INDEPENDENT_AMBULATORY_CARE_PROVIDER_SITE_OTHER): Payer: Medicare Other | Admitting: Cardiology

## 2014-03-16 ENCOUNTER — Encounter: Payer: Self-pay | Admitting: Cardiology

## 2014-03-16 VITALS — BP 124/64 | HR 66 | Ht 65.0 in | Wt 156.0 lb

## 2014-03-16 DIAGNOSIS — I10 Essential (primary) hypertension: Secondary | ICD-10-CM

## 2014-03-16 DIAGNOSIS — I2510 Atherosclerotic heart disease of native coronary artery without angina pectoris: Secondary | ICD-10-CM

## 2014-03-16 DIAGNOSIS — E782 Mixed hyperlipidemia: Secondary | ICD-10-CM

## 2014-03-16 DIAGNOSIS — Z9861 Coronary angioplasty status: Secondary | ICD-10-CM

## 2014-03-16 DIAGNOSIS — I351 Nonrheumatic aortic (valve) insufficiency: Secondary | ICD-10-CM

## 2014-03-16 NOTE — Progress Notes (Signed)
[name] Date of Birth: 1935/03/15 Medical Record #[id number]  History of Present Illness: [name] is seen for follow up today. He is s/p CABG in 2003 by Dr. Cornelius Moras with LIMA to the LAD and SVG to the Diagonal. By cath in 2008 he had a high grade proximal RCA stenosis and had stenting with a 2.75 mm Promus stent by Dr. Juanda Chance. He was noted to have an atretic SVG to the diagonal but it was felt that antegrade flow into the diagonal was OK. His last stress Myoview in November 2012 showed a fixed apical defect without ischemia and EF 62%.  In May he presented with unstable angina. Cardiac cath showed patent LIMA to the LAD and occlusion of SVG to the diagonal. There was a severe stenosis in the RCA which was stented with a DES. He also had an Echo in 9/15 for a murmur and has AV sclerosis with mild to moderate AI.  On follow up today he feels very well. No chest pain or SOB. He stays active but doesn't do any formal exercise. Tries to eat healthy.    Medication List       This list is accurate as of: 03/16/14 10:35 AM.  Always use your most recent med list.               aspirin EC 81 MG tablet  Take 81 mg by mouth daily.     clopidogrel 75 MG tablet  Commonly known as:  PLAVIX  Take 1 tablet (75 mg total) by mouth daily with breakfast.     fluticasone 27.5 MCG/SPRAY nasal spray  Commonly known as:  VERAMYST  Place 1 spray into both nostrils daily. AS NEEDED     loratadine 10 MG tablet  Commonly known as:  CLARITIN  Take 10 mg by mouth daily.     meclizine 50 MG tablet  Commonly known as:  ANTIVERT  Take 0.5 tablets (25 mg total) by mouth 3 (three) times daily as needed.     metoprolol tartrate 25 MG tablet  Commonly known as:  LOPRESSOR  Take 0.5 tablets (12.5 mg total) by mouth 2 (two) times daily.     multivitamin with minerals Tabs tablet  Take 1 tablet by mouth daily at 12 noon.     nitroGLYCERIN 0.4 MG SL tablet  Commonly known as:  NITROSTAT  Place 0.4  mg under the tongue every 5 (five) minutes as needed for chest pain.     rosuvastatin 20 MG tablet  Commonly known as:  CRESTOR  Take 20 mg by mouth at bedtime.         Allergies  Allergen Reactions  . Codeine Nausea Only    Past Medical History  Diagnosis Date  . Hyperlipidemia   . Hypertension   . Diverticulosis   . Gilbert's syndrome   . Osteopenia 2010     T score -1.9 @ hip (femoral neck)  . CAD S/P percutaneous coronary angioplasty 2003    Dr Antoine Poche: 2003: a) s/p CABG (LIMA-LAD, SVG-Diag);b) 2008: prior PCI to native RCA; c) 12/12/2013: SVG-Diag occluded, patent LIMA, 95% post stent in RCA --> PCI  with 2.75 x 18 mm Xience Alpine DES, residual dRCA-PLA 80=-90%, EF 55-65%  . PUD (peptic ulcer disease)     PMH , ? 1995  . Anginal pain   . Heart murmur   . Shortness of breath   . Short-term memory loss   . GERD (gastroesophageal reflux disease)   .  Arthritis     BACK & SHOULDERS    Past Surgical History  Procedure Laterality Date  . Hemorrhoid surgery    . Cholecystectomy  2004  . Cervical fusion  1997    Dr Jeral Fruit  . Coronary artery bypass graft  2003    Last catheterization was in August 2008 demonstrated a LIMA to th LAD which was patent, there was an atretic saphenous vein graft to the diagonal, the LAD had a 90% stenosis in the large calcified segment, the diagonal has ostial 70% stenosis, the circumflex had a ramus intermediate with ostial 25% stenosis, the right coronary artery was dominant.  There was an ulcerated 80%-90% stenosis.    . Tonsillectomy and adenoidectomy    . Coronary stent placement  2008    drug-eluting stent to right coronary artery  . Upper gastrointestinal endoscopy       Dr Victorino Dike  . Colonoscopy      Dr Victorino Dike  . Coronary stent placement Right 12/12/2013    RT CORONARY  DES       DR COOPER     History   Social History  . Marital Status: Married    Spouse Name: [name]    Number of Children: 2  . Years of Education:  Bachelors   Occupational History  .     Social History Main Topics  . Smoking status: Former Smoker    Quit date: 02/25/1979  . Smokeless tobacco: Never Used     Comment: smoked 1955-1980, up to 1 ppd  . Alcohol Use: Yes  . Drug Use: No  . Sexual Activity: None   Other Topics Concern  . 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   Patient has two adult children.   Patient is retired.   Patient has a Bachelor's degree.   Patient is right-handed.   Patient drinks 1/2 liter of green tea daily.   Walks once week for exercise    Family History  Problem Relation Age of Onset  . Cancer Father     ? primary  . Uterine cancer Daughter   . Hypertension Maternal Grandmother   . Osteoporosis Sister   . Diabetes Sister   . Stroke Neg Hx   . Peripheral vascular disease Maternal Grandmother   . Heart attack Neg Hx     Review of Systems: As noted in HPI.  All other systems were reviewed and are negative.  Physical Exam: BP 124/64  Pulse 66  Ht 5\' 5"  (1.651 m)  Wt 156 lb (70.761 kg)  BMI 25.96 kg/m2 Filed Weights   03/16/14 1003  Weight: 156 lb (70.761 kg)  GENERAL:  Well appearing WM in NAD. HEENT:  PERRL, EOMI, sclera are clear. Oropharynx is clear. NECK:  No jugular venous distention, carotid upstroke brisk and symmetric, no bruits, no thyromegaly or adenopathy LUNGS:  Clear to auscultation bilaterally CHEST:  Unremarkable HEART:  RRR,  PMI not displaced or sustained,S1 and S2 within normal limits, Grade 2/6 harsh systolic murmur at the apex with soft diastolic murmur at the LSB.  ABD:  Soft, nontender. BS +, no masses or bruits. No hepatomegaly, no splenomegaly EXT:  2 + pulses throughout, no edema, no cyanosis no clubbing SKIN:  Warm and dry.  No rashes NEURO:  Alert and oriented x 3. Cranial nerves II through XII intact. PSYCH:  Cognitively intact    LABORATORY DATA:  Lab Results  Component Value Date   WBC  9.5 01/15/2014   HGB 16.2 01/15/2014   HCT 45.5 01/15/2014   PLT 200 01/15/2014   GLUCOSE 121* 01/15/2014   CHOL 134 10/14/2013   TRIG 69.0 10/14/2013   HDL 48.10 10/14/2013   LDLCALC 72 10/14/2013   ALT 20 01/15/2014   AST 21 01/15/2014   NA 138 01/15/2014   K 4.2 01/15/2014   CL 98 01/15/2014   CREATININE 0.70 01/15/2014   BUN 15 01/15/2014   CO2 26 01/15/2014   TSH 1.20 10/14/2013   PSA 0.98 06/04/2006   INR 0.98 01/15/2014   HGBA1C 5.7 02/13/2011     Assessment / Plan: 1. CAD. Known CAD s/p CABG in 2003 and subsequent stenting of native RCA 2008. DES of the proximal RCA in June 2015. Recommend continued DAPT indefinitely as long as he is not having any bleeding problems.    2. Mild to moderate AI.  Normal LV size and function. Asymptomatic. Will follow every 4-5 years unless status changes.   3. Hyperlipidemia- on statin. Will check fasting lab work in 6 months.  4. HTN- controlled.

## 2014-03-16 NOTE — Patient Instructions (Signed)
Continue your current therapy  I will see you in 6 months with fasting lab work.   

## 2014-04-03 ENCOUNTER — Telehealth: Payer: Self-pay | Admitting: Neurology

## 2014-04-03 DIAGNOSIS — F515 Nightmare disorder: Secondary | ICD-10-CM

## 2014-04-03 DIAGNOSIS — Z9989 Dependence on other enabling machines and devices: Secondary | ICD-10-CM

## 2014-04-03 DIAGNOSIS — G4733 Obstructive sleep apnea (adult) (pediatric): Secondary | ICD-10-CM

## 2014-04-03 NOTE — Telephone Encounter (Signed)
Patient calls the office with a complaint that he thinks that his CPAP pressure might be set too high.  He says that his ears are popping so badly when during the evening that it is causing him to awaken.  He says that when he called [facility] they told him that he had to follow up with his physician.

## 2014-04-04 NOTE — Telephone Encounter (Signed)
Contacted patient about his sleep issues.  Though Dr. Oliva Bustardohmeier's recommendations were for a nasal PICO mask, patient states they were out of stock at [facility] and was eventually fitted with a nasal/oral interface.  Notes in [facility] state a [employer] View mask being used.  I am contacting [facility] and want the patient to try and use a nasal mask, which was recommended.

## 2014-04-10 ENCOUNTER — Encounter: Payer: Self-pay | Admitting: Internal Medicine

## 2014-04-10 ENCOUNTER — Ambulatory Visit (INDEPENDENT_AMBULATORY_CARE_PROVIDER_SITE_OTHER): Payer: Medicare Other | Admitting: Internal Medicine

## 2014-04-10 VITALS — BP 120/88 | HR 66 | Temp 97.8°F | Resp 13 | Wt 156.4 lb

## 2014-04-10 DIAGNOSIS — H8110 Benign paroxysmal vertigo, unspecified ear: Secondary | ICD-10-CM

## 2014-04-10 DIAGNOSIS — Z23 Encounter for immunization: Secondary | ICD-10-CM

## 2014-04-10 NOTE — Progress Notes (Signed)
Pre visit review using our clinic review tool, if applicable. No additional management support is needed unless otherwise documented below in the visit note. 

## 2014-04-10 NOTE — Progress Notes (Signed)
   Subjective:    Patient ID: Matthew LopesJuan A Marciel, male    DOB: 1934/06/30, 79 y.o.   MRN: [id number]  HPI   He was seen in emergency room on 2 occasions 10/26/13 as well as 01/15/14 with dizziness and vertigo. He had CAT scans on both occasions as well as EKG and extensive labs.Those records were reviewed.  A third episode occurred 03/31/14. These occur while he is in bed change with change in position of his head and with sitting up on the side of the bed. It occurred with hyperextension of the neck as well as rotation of the head.  There was no cardiac or neurologic prodrome prior to the events.  He has started CPAP the last 10-14 days. His wife also describes symptoms of restless leg.       Review of Systems  Denied were any change in heart rhythm or rate prior to the event. There was no associated chest pain or shortness of breath .  Also specifically denied prior to the episode were headache, limb weakness, tingling, or numbness. No seizure activity noted.      Objective:   Physical Exam Positive or pertinent findings include: He has dense arcus senilis bilaterally He has an upper plate and lower partial There is decreased range of motion the cervical spine. He has a grade 1 systolic murmur at the base. The neurologic exam revealed no deficit. Tuning fork exam was negative. Whispered speech was heard at 6 feet. Romberg and finger-nose testing was negative. Deep tendon reflexes were decreased at the knees.  General appearance :adequately nourished; in no distress. Eyes: No conjunctival inflammation or scleral icterus is present. Oral exam:  Lips and gums are healthy appearing.There is no oropharyngeal erythema or exudate noted.  Heart:  Normal rate and regular rhythm. S1 and S2 normal without gallop, click, rub or other extra sounds   Lungs:Chest clear to auscultation; no wheezes, rhonchi,rales ,or rubs present.No increased work of breathing.  Abdomen: bowel sounds normal,  soft and non-tender without masses, organomegaly or hernias noted.  No guarding or rebound.  Vascular : all pulses equal ; no bruits present. Skin:Warm & dry.  Intact without suspicious lesions or rashes ; no jaundice or tenting Lymphatic: No lymphadenopathy is noted about the head, neck, axilla          Assessment & Plan:  #1 BPV See AVS

## 2014-04-10 NOTE — Patient Instructions (Signed)
Go to Web M.D. for information on benign positional vertigo (BPV) . Physical therapy exercises can treat that. The referral will be scheduled and you'll be notified of the time.Please call the Referral Co-Ordinator @ [phone] if you have not been notified of appointment time within 7-10 days.

## 2014-04-12 NOTE — Telephone Encounter (Signed)
reduced overall CPAP pressure  by 1 cm and introduced EPR of 3 cm water.  CD send to [employer] [facility] [employer] [employer].

## 2014-04-12 NOTE — Telephone Encounter (Signed)
6 cm CPAP- 3 cm EPR>

## 2014-04-18 ENCOUNTER — Other Ambulatory Visit: Payer: Self-pay | Admitting: Internal Medicine

## 2014-04-18 ENCOUNTER — Telehealth: Payer: Self-pay | Admitting: Internal Medicine

## 2014-04-18 DIAGNOSIS — H8110 Benign paroxysmal vertigo, unspecified ear: Secondary | ICD-10-CM | POA: Insufficient documentation

## 2014-04-18 NOTE — Telephone Encounter (Signed)
The PT referral entered; you'll be notified of the time.Please call the Referral Co-Ordinator @ [phone] if you have not been notified of appointment time within 7-10 days.

## 2014-04-18 NOTE — Telephone Encounter (Signed)
Pt called in said that he was suppose to have a referral for physical therapy,.  He called inferring about the referral.

## 2014-04-25 ENCOUNTER — Ambulatory Visit: Payer: Medicare Other | Attending: Internal Medicine | Admitting: Physical Therapy

## 2014-04-25 ENCOUNTER — Telehealth: Payer: Self-pay

## 2014-04-25 ENCOUNTER — Encounter: Payer: Self-pay | Admitting: Physical Therapy

## 2014-04-25 DIAGNOSIS — Z5189 Encounter for other specified aftercare: Secondary | ICD-10-CM | POA: Diagnosis not present

## 2014-04-25 DIAGNOSIS — H8111 Benign paroxysmal vertigo, right ear: Secondary | ICD-10-CM | POA: Diagnosis not present

## 2014-04-25 NOTE — Patient Instructions (Addendum)
Benign Positional Vertigo Vertigo means you feel like you or your surroundings are moving when they are not. Benign positional vertigo is the most common form of vertigo. Benign means that the cause of your condition is not serious. Benign positional vertigo is more common in older adults. CAUSES  Benign positional vertigo is the result of an upset in the labyrinth system. This is an area in the middle ear that helps control your balance. This may be caused by a viral infection, head injury, or repetitive motion. However, often no specific cause is found. SYMPTOMS  Symptoms of benign positional vertigo occur when you move your head or eyes in different directions. Some of the symptoms may include:  Loss of balance and falls.  Vomiting.  Blurred vision.  Dizziness.  Nausea.  Involuntary eye movements (nystagmus). DIAGNOSIS  Benign positional vertigo is usually diagnosed by physical exam. If the specific cause of your benign positional vertigo is unknown, your caregiver may perform imaging tests, such as magnetic resonance imaging (MRI) or computed tomography (CT). TREATMENT  Your caregiver may recommend movements or procedures to correct the benign positional vertigo. Medicines such as meclizine, benzodiazepines, and medicines for nausea may be used to treat your symptoms. In rare cases, if your symptoms are caused by certain conditions that affect the inner ear, you may need surgery. HOME CARE INSTRUCTIONS   Follow your caregiver's instructions.  Move slowly. Do not make sudden body or head movements.  Avoid driving.  Avoid operating heavy machinery.  Avoid performing any tasks that would be dangerous to you or others during a vertigo episode.  Drink enough fluids to keep your urine clear or pale yellow. SEEK IMMEDIATE MEDICAL CARE IF:   You develop problems with walking, weakness, numbness, or using your arms, hands, or legs.  You have difficulty speaking.  You develop  severe headaches.  Your nausea or vomiting continues or gets worse.  You develop visual changes.  Your family or friends notice any behavioral changes.  Your condition gets worse.  You have a fever.  You develop a stiff neck or sensitivity to light. MAKE SURE YOU:   Understand these instructions.  Will watch your condition.  Will get help right away if you are not doing well or get worse. Document Released: 03/10/2006 Document Revised: 08/25/2011 Document Reviewed: 02/20/2011 [facility] [place] Patient Information 2015 [employer] [place] [place]. This information is not intended to replace advice given to you by your health care provider. Make sure you discuss any questions you have with your health care provider.  Gave pt. Brandt-Daroff exercises - instructed to do sequence 5 times 3x/day

## 2014-04-25 NOTE — Therapy (Signed)
Physical Therapy Evaluation  Patient Details  Name: [name] MRN: [id number] Date of Birth: 23-Sep-1934  Encounter Date: 04/25/2014      PT End of Session - 04/25/14 1524    Visit Number 1   Number of Visits 4   Date for PT Re-Evaluation 05/25/14   PT Start Time 1410   PT Stop Time 1500   PT Time Calculation (min) 50 min   Activity Tolerance Patient tolerated treatment well      Past Medical History  Diagnosis Date  . Hyperlipidemia   . Hypertension   . Diverticulosis   . Gilbert's syndrome   . Osteopenia 2010     T score -1.9 @ hip (femoral neck)  . CAD S/P percutaneous coronary angioplasty 2003    Dr Antoine Poche: 2003: a) s/p CABG (LIMA-LAD, SVG-Diag);b) 2008: prior PCI to native RCA; c) 12/12/2013: SVG-Diag occluded, patent LIMA, 95% post stent in RCA --> PCI  with 2.75 x 18 mm Xience Alpine DES, residual dRCA-PLA 80=-90%, EF 55-65%  . PUD (peptic ulcer disease)     PMH , ? 1995  . Anginal pain   . Heart murmur   . Shortness of breath   . Short-term memory loss   . GERD (gastroesophageal reflux disease)   . Arthritis     BACK & SHOULDERS    Past Surgical History  Procedure Laterality Date  . Hemorrhoid surgery    . Cholecystectomy  2004  . Cervical fusion  1997    Dr Jeral Fruit  . Coronary artery bypass graft  2003    Last catheterization was in August 2008 demonstrated a LIMA to th LAD which was patent, there was an atretic saphenous vein graft to the diagonal, the LAD had a 90% stenosis in the large calcified segment, the diagonal has ostial 70% stenosis, the circumflex had a ramus intermediate with ostial 25% stenosis, the right coronary artery was dominant.  There was an ulcerated 80%-90% stenosis.    . Tonsillectomy and adenoidectomy    . Coronary stent placement  2008    drug-eluting stent to right coronary artery  . Upper gastrointestinal endoscopy       Dr Victorino Dike  . Colonoscopy      Dr Victorino Dike  . Coronary stent placement Right 12/12/2013     RT CORONARY  DES       DR COOPER     There were no vitals taken for this visit.  Visit Diagnosis:  BPPV (benign paroxysmal positional vertigo), right - Plan: PT plan of care cert/re-cert      Subjective Assessment - 04/25/14 1416    Symptoms pt. reports vertigo with supine to sit in the morning; vertigo is worse in the am   Pertinent History pt. reports episode of vertigo in May 2015; went to ED in [facility]; occurred again in August 2015 - Urgent Care - diagnosed with vertigo   Patient Stated Goals resolve the vertigo   Currently in Pain? Yes   Pain Score 4    Pain Location Shoulder   Pain Orientation Right   Pain Descriptors / Indicators Dull   Pain Type Chronic pain   Aggravating Factors  certain movements aggravate   Pain Relieving Factors resting - avoiding those movements   Multiple Pain Sites No          [facility] PT Assessment - 04/25/14 1514    Assessment   Medical Diagnosis BPPV   Onset Date --  Aug. 2015   Prior  Therapy none   Balance Screen   Has the patient fallen in the past 6 months No   Has the patient had a decrease in activity level because of a fear of falling?  No   Is the patient reluctant to leave their home because of a fear of falling?  No   Prior Function   Level of Independence Independent with gait   Special Tests    Special Tests --  positive right Dix-Hallpike test            PT Education - 04/25/14 1521    Education provided Yes   Education Details Brandt-Daroff exercises   Person(s) Educated Patient   Methods Explanation;Demonstration   Comprehension Verbalized understanding;Returned demonstration          PT Short Term Goals - 04/25/14 1534    PT SHORT TERM GOAL #1   Title same as LTG's          PT Long Term Goals - 04/25/14 1536    PT LONG TERM GOAL #1   Title Pt. will have a negative right Dix-hallpike test without nystagmus and no c/o vertigo to indicate that R BPPV has resolved   Time 4  05-25-14   Period Weeks    Status On-going   PT LONG TERM GOAL #2   Title Report no vertigo with bed mobility or ambulation   Time 4  05-25-14   Period Weeks   Status On-going   PT LONG TERM GOAL #3   Title Independent in Brandt-Daroff exercises for habituation of R BPPV   Time 4  05-25-14   Period Weeks   Status On-going          Plan - 04/25/14 1531    Pt will benefit from skilled therapeutic intervention in order to improve on the following deficits Abnormal gait;Decreased balance  vertigo   Rehab Potential Good   PT Frequency 2x / week   PT Duration 4 weeks   PT Treatment/Interventions Neuromuscular re-education;Other (comment);Balance training;Therapeutic exercise;Patient/family education  canalith repositioning maneuver   PT Next Visit Plan epley's maneuver for R BPPV   PT Home Exercise Plan [email or web address]   Consulted and Agree with Plan of Care Patient          G-Codes - 04/25/14 1544    Functional Assessment Tool Used bed mobility and transfers with severity of vertigo reported   Self Care Current Status (Z6109(G8987) --   Self Care Goal Status (U0454(G8988) --   Other PT Primary Current Status (U9811(G8990) At least 20 percent but less than 40 percent impaired, limited or restricted   Other PT Primary Goal Status (B1478(G8991) At least 1 percent but less than 20 percent impaired, limited or restricted      Problem List Patient Active Problem List   Diagnosis Date Noted  . BPV (benign positional vertigo) 04/18/2014  . Aortic regurgitation 01/09/2014  . Angina, class III 12/12/2013  . Angina pectoris, crescendo 12/09/2013  . DOE (dyspnea on exertion) 10/15/2013  . Other malaise and fatigue 10/15/2013  . Neck pain 04/21/2013  . GERD 09/21/2009  . 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 ARTHRITIS, GENERALIZED 01/09/2009  . OSTEOPENIA 01/09/2009  . CAD S/P percutaneous coronary angioplasty - PCI of RCA (12/12/2013) 11/16/2008  . DIVERTICULOSIS, COLON 11/16/2008  . HYPERLIPIDEMIA  05/04/2007  . Essential hypertension 05/04/2007                Vestibular Treatment/Exercise - 04/25/14 1450    Number of  Reps 1 3   Overall Response 1 Improved Symptoms   Response Details 1 some nausea reported                                      Kary KosDilday, Gaither Biehn Suzanne 04/25/2014, 3:57 PM

## 2014-04-25 NOTE — Telephone Encounter (Signed)
I am sorry; I placed the order 04/18/14; but I have no control over their scheduling. [facility]

## 2014-04-25 NOTE — Telephone Encounter (Signed)
Patient has been advised and ended up speaking to Dr Alwyn RenHopper directly.

## 2014-04-25 NOTE — Telephone Encounter (Signed)
Phone call from patient. He states he saw you on 04/10/14 and a referral was to be placed for therapy. This referral was placed on 04/18/14. He has an appointment 05/04/14. He upset and asks to speak to you directly as to why it took so long to be notified.

## 2014-04-27 ENCOUNTER — Ambulatory Visit: Payer: Medicare Other | Admitting: Physical Therapy

## 2014-04-27 ENCOUNTER — Encounter: Payer: Self-pay | Admitting: Physical Therapy

## 2014-04-27 DIAGNOSIS — Z5189 Encounter for other specified aftercare: Secondary | ICD-10-CM | POA: Diagnosis not present

## 2014-04-27 DIAGNOSIS — H8111 Benign paroxysmal vertigo, right ear: Secondary | ICD-10-CM

## 2014-04-27 NOTE — Therapy (Signed)
Physical Therapy Treatment  Patient Details  Name: [name] MRN: [id number] Date of Birth: May 03, 1935  Encounter Date: 04/27/2014      PT End of Session - 04/27/14 1632    Visit Number 2   Number of Visits 8   Date for PT Re-Evaluation 05/25/14   PT Start Time 1535   PT Stop Time 1415   PT Time Calculation (min) 1360 min   Activity Tolerance Patient tolerated treatment well      Past Medical History  Diagnosis Date  . Hyperlipidemia   . Hypertension   . Diverticulosis   . Gilbert's syndrome   . Osteopenia 2010     T score -1.9 @ hip (femoral neck)  . CAD S/P percutaneous coronary angioplasty 2003    Dr Antoine PocheHochrein: 2003: a) s/p CABG (LIMA-LAD, SVG-Diag);b) 2008: prior PCI to native RCA; c) 12/12/2013: SVG-Diag occluded, patent LIMA, 95% post stent in RCA --> PCI  with 2.75 x 18 mm Xience Alpine DES, residual dRCA-PLA 80=-90%, EF 55-65%  . PUD (peptic ulcer disease)     PMH , ? 1995  . Anginal pain   . Heart murmur   . Shortness of breath   . Short-term memory loss   . GERD (gastroesophageal reflux disease)   . Arthritis     BACK & SHOULDERS    Past Surgical History  Procedure Laterality Date  . Hemorrhoid surgery    . Cholecystectomy  2004  . Cervical fusion  1997    Dr Jeral FruitBotero  . Coronary artery bypass graft  2003    Last catheterization was in August 2008 demonstrated a LIMA to th LAD which was patent, there was an atretic saphenous vein graft to the diagonal, the LAD had a 90% stenosis in the large calcified segment, the diagonal has ostial 70% stenosis, the circumflex had a ramus intermediate with ostial 25% stenosis, the right coronary artery was dominant.  There was an ulcerated 80%-90% stenosis.    . Tonsillectomy and adenoidectomy    . Coronary stent placement  2008    drug-eluting stent to right coronary artery  . Upper gastrointestinal endoscopy       Dr Victorino DikeSam [place]  . Colonoscopy      Dr Victorino DikeSam [place]  . Coronary stent placement Right 12/12/2013   RT CORONARY  DES       DR COOPER     There were no vitals taken for this visit.  Visit Diagnosis:  BPPV (benign paroxysmal positional vertigo), right      Subjective Assessment - 04/27/14 1625    Symptoms pt. states he is still having vertigo - may be a little bit better than it was on Tuesday but experienced vertigo around 9:00 this am   Currently in Pain? No/denies              PT Education - 04/27/14 1631    Education Details instructed pt to continue with Brandt-Daroff exercises starting tomorrow    Person(s) Educated Patient   Methods Explanation   Comprehension Verbalized understanding              Plan - 04/27/14 1634    Clinical Impression Statement pt. continues to have positive right Dix-Hallpike test with nystagmus - indicative of R BPPV; intensity is not as intense as that reported on 04-25-14   Pt will benefit from skilled therapeutic intervention in order to improve on the following deficits Other (comment);Abnormal gait;Decreased balance  vertigo   Rehab Potential Good  PT Frequency 2x / week   PT Duration 4 weeks   PT Treatment/Interventions Other (comment);Patient/family education;Neuromuscular re-education;Balance training;Therapeutic exercise  vestibular rehab; canalith repositioning maneuver   PT Next Visit Plan epley's maneuver for R BPPV (right)   PT Home Exercise Plan [email or web address]   Consulted and Agree with Plan of Care Patient        Problem List Patient Active Problem List   Diagnosis Date Noted  . BPV (benign positional vertigo) 04/18/2014  . Aortic regurgitation 01/09/2014  . Angina, class III 12/12/2013  . Angina pectoris, crescendo 12/09/2013  . DOE (dyspnea on exertion) 10/15/2013  . Other malaise and fatigue 10/15/2013  . Neck pain 04/21/2013  . GERD 09/21/2009  . 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 ARTHRITIS, GENERALIZED 01/09/2009  . OSTEOPENIA 01/09/2009  . CAD S/P percutaneous coronary  angioplasty - PCI of RCA (12/12/2013) 11/16/2008  . DIVERTICULOSIS, COLON 11/16/2008  . HYPERLIPIDEMIA 05/04/2007  . Essential hypertension 05/04/2007                Vestibular Treatment/Exercise - 04/27/14 1627    Vestibular Treatment Provided Canalith Repositioning  x 3 reps for R BPPV   Canalith Repositioning Epley Manuever Right   Habituation Exercises Austin MilesBrandt Daroff  reviewed HEP   Gaze Exercises --  sit to right sidelying:  sit to supine x 2 reps   Number of Reps 1 3   Overall Response 1 Symptoms Worsened   Response Details 1 --  nausea occurred after 3rd rep Epley's   Overall Response 3 Improved Symptoms                                   Kerry FortSuzanne Kinzy Weyers, PT [facility] [address] [place], [place] [address] [phone]   Kary KosDilday, [name] Suzanne 04/27/2014, 4:41 PM

## 2014-05-02 ENCOUNTER — Encounter: Payer: Self-pay | Admitting: Neurology

## 2014-05-02 ENCOUNTER — Encounter: Payer: Self-pay | Admitting: Physical Therapy

## 2014-05-02 ENCOUNTER — Ambulatory Visit: Payer: Medicare Other | Admitting: Physical Therapy

## 2014-05-02 DIAGNOSIS — H8111 Benign paroxysmal vertigo, right ear: Secondary | ICD-10-CM

## 2014-05-02 DIAGNOSIS — Z5189 Encounter for other specified aftercare: Secondary | ICD-10-CM | POA: Diagnosis not present

## 2014-05-02 NOTE — Therapy (Signed)
Physical Therapy Treatment  Patient Details  Name: Matthew LopesJuan A Virtue MRN: [id number] Date of Birth: 04/27/1935  Encounter Date: 05/02/2014      PT End of Session - 05/02/14 1628    Visit Number 3   Number of Visits 8   Date for PT Re-Evaluation 05/25/14   PT Start Time 1401   PT Stop Time 1445   PT Time Calculation (min) 44 min   Activity Tolerance Patient tolerated treatment well  less c/o nausea today after Epley's maneuver      Past Medical History  Diagnosis Date  . Hyperlipidemia   . Hypertension   . Diverticulosis   . Gilbert's syndrome   . Osteopenia 2010     T score -1.9 @ hip (femoral neck)  . CAD S/P percutaneous coronary angioplasty 2003    Dr Antoine PocheHochrein: 2003: a) s/p CABG (LIMA-LAD, SVG-Diag);b) 2008: prior PCI to native RCA; c) 12/12/2013: SVG-Diag occluded, patent LIMA, 95% post stent in RCA --> PCI  with 2.75 x 18 mm Xience Alpine DES, residual dRCA-PLA 80=-90%, EF 55-65%  . PUD (peptic ulcer disease)     PMH , ? 1995  . Anginal pain   . Heart murmur   . Shortness of breath   . Short-term memory loss   . GERD (gastroesophageal reflux disease)   . Arthritis     BACK & SHOULDERS    Past Surgical History  Procedure Laterality Date  . Hemorrhoid surgery    . Cholecystectomy  2004  . Cervical fusion  1997    Dr Jeral FruitBotero  . Coronary artery bypass graft  2003    Last catheterization was in August 2008 demonstrated a LIMA to th LAD which was patent, there was an atretic saphenous vein graft to the diagonal, the LAD had a 90% stenosis in the large calcified segment, the diagonal has ostial 70% stenosis, the circumflex had a ramus intermediate with ostial 25% stenosis, the right coronary artery was dominant.  There was an ulcerated 80%-90% stenosis.    . Tonsillectomy and adenoidectomy    . Coronary stent placement  2008    drug-eluting stent to right coronary artery  . Upper gastrointestinal endoscopy       Dr Victorino DikeSam [place]  . Colonoscopy      Dr Victorino DikeSam [place]  .  Coronary stent placement Right 12/12/2013    RT CORONARY  DES       DR COOPER     There were no vitals taken for this visit.  Visit Diagnosis:  BPPV (benign paroxysmal positional vertigo), right      Subjective Assessment - 05/02/14 1526    Symptoms Pt. states Sunday was a good day - has experienced little vertigo since treatment last Thursday - feels vertigo is decreasing   Pertinent History pt. reports episode of vertigo in May 2015; [facility]; occurred again in August 2015 - Urgent Care - diagnosed with vertigo   Patient Stated Goals resolve the vertigo   Currently in Pain? No/denies   Multiple Pain Sites No              PT Education - 05/02/14 1533    Education Details instructed pt to continue with Brandt-Daroff exercises 3x/day - recommended pt. to perform 1x this evening before going to bed   Person(s) Educated Patient   Methods Explanation   Comprehension Verbalized understanding              Plan - 05/02/14 1631  Clinical Impression Statement pt. had less rotary nystagmus today with epley's maneuvers - indicative that R BPPV is improving; pt. reports he is feeling better and isn't experiencing alot of vertigo at home within past 3 days   Pt will benefit from skilled therapeutic intervention in order to improve on the following deficits Other (comment);Abnormal gait;Decreased balance  vertigo   PT Frequency 2x / week   PT Duration 3 weeks   PT Treatment/Interventions Other (comment);Patient/family education;Neuromuscular re-education;Balance training;Therapeutic exercise   PT Next Visit Plan recheck right Dix-Hallpike test: epley's maneuver for R BPPV (right) if needed   PT Home Exercise Plan [email or web address]   Consulted and Agree with Plan of Care Patient        Problem List Patient Active Problem List   Diagnosis Date Noted  . BPV (benign positional vertigo) 04/18/2014  . Aortic regurgitation 01/09/2014  . Angina, class III 12/12/2013   . Angina pectoris, crescendo 12/09/2013  . DOE (dyspnea on exertion) 10/15/2013  . Other malaise and fatigue 10/15/2013  . Neck pain 04/21/2013  . GERD 09/21/2009  . 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 ARTHRITIS, GENERALIZED 01/09/2009  . OSTEOPENIA 01/09/2009  . CAD S/P percutaneous coronary angioplasty - PCI of RCA (12/12/2013) 11/16/2008  . DIVERTICULOSIS, COLON 11/16/2008  . HYPERLIPIDEMIA 05/04/2007  . Essential hypertension 05/04/2007                Vestibular Treatment/Exercise - 05/02/14 1644    Vestibular Treatment Provided Canalith Repositioning  4 reps   Canalith Repositioning Epley Manuever Right   Habituation Exercises Austin MilesBrandt Daroff   Gaze Exercises --  sit to right sidelying   Number of Reps 1 4   Overall Response 1 Improved Symptoms   Response Details 1 --  nystagmus observed - with delayed onset on 3rd rep   Number of Reps 3 3   Number of Reps 11 4  sit to right sidelying   Symptom Description 1 minimal c/o vertigo in sidelying position but c/o vertigo with return to upright   COMMENT reviewed etiology of BPPV in regards to location of crystals being in canal rather than in utricle where they should be located; pt. verbalized understanding                               Kerry FortSuzanne Thomasa Heidler, PT [facility] [address] [place], [place] [address] [phone]        Kary KosDilday, Laquanta Hummel Suzanne 05/02/2014, 4:46 PM

## 2014-05-04 ENCOUNTER — Encounter: Payer: Medicare Other | Admitting: Rehabilitative and Restorative Service Providers"

## 2014-05-04 ENCOUNTER — Ambulatory Visit: Payer: Medicare Other | Admitting: Rehabilitative and Restorative Service Providers"

## 2014-05-05 ENCOUNTER — Ambulatory Visit: Payer: Medicare Other | Admitting: Rehabilitative and Restorative Service Providers"

## 2014-05-08 ENCOUNTER — Ambulatory Visit: Payer: Medicare Other | Admitting: Physical Therapy

## 2014-05-15 ENCOUNTER — Ambulatory Visit: Payer: Medicare Other | Admitting: Rehabilitative and Restorative Service Providers"

## 2014-05-15 DIAGNOSIS — H8111 Benign paroxysmal vertigo, right ear: Secondary | ICD-10-CM

## 2014-05-15 DIAGNOSIS — Z5189 Encounter for other specified aftercare: Secondary | ICD-10-CM | POA: Diagnosis not present

## 2014-05-15 NOTE — Therapy (Signed)
Physical Therapy Treatment  Patient Details  Name: Matthew LopesJuan A [name] MRN: [id number] Date of Birth: 1934-07-19  Encounter Date: 05/15/2014      PT End of Session - 05/15/14 1229    Visit Number 4   Number of Visits 8   Date for PT Re-Evaluation 05/25/14   PT Start Time 1154   PT Stop Time 1234   PT Time Calculation (min) 40 min   Activity Tolerance Patient tolerated treatment well      Past Medical History  Diagnosis Date  . Hyperlipidemia   . Hypertension   . Diverticulosis   . Gilbert's syndrome   . Osteopenia 2010     T score -1.9 @ hip (femoral neck)  . CAD S/P percutaneous coronary angioplasty 2003    Dr Antoine PocheHochrein: 2003: a) s/p CABG (LIMA-LAD, SVG-Diag);b) 2008: prior PCI to native RCA; c) 12/12/2013: SVG-Diag occluded, patent LIMA, 95% post stent in RCA --> PCI  with 2.75 x 18 mm Xience Alpine DES, residual dRCA-PLA 80=-90%, EF 55-65%  . PUD (peptic ulcer disease)     PMH , ? 1995  . Anginal pain   . Heart murmur   . Shortness of breath   . Short-term memory loss   . GERD (gastroesophageal reflux disease)   . Arthritis     BACK & SHOULDERS    Past Surgical History  Procedure Laterality Date  . Hemorrhoid surgery    . Cholecystectomy  2004  . Cervical fusion  1997    Dr Jeral FruitBotero  . Coronary artery bypass graft  2003    Last catheterization was in August 2008 demonstrated a LIMA to th LAD which was patent, there was an atretic saphenous vein graft to the diagonal, the LAD had a 90% stenosis in the large calcified segment, the diagonal has ostial 70% stenosis, the circumflex had a ramus intermediate with ostial 25% stenosis, the right coronary artery was dominant.  There was an ulcerated 80%-90% stenosis.    . Tonsillectomy and adenoidectomy    . Coronary stent placement  2008    drug-eluting stent to right coronary artery  . Upper gastrointestinal endoscopy       Dr Victorino DikeSam [place]  . Colonoscopy      Dr Victorino DikeSam [place]  . Coronary stent placement Right 12/12/2013   RT CORONARY  DES       DR COOPER     There were no vitals taken for this visit.  Visit Diagnosis:  BPPV (benign paroxysmal positional vertigo), right      Subjective Assessment - 05/15/14 1159    Symptoms Pt reports he is doing brandt daroff HEP and is still noting dizziness when he first begins exercise.  He is feeling symptoms overall have improved.  He is planning to go out of town in a couple of days and return 05/25/14.                                                           Currently in Pain? Yes   Pain Location Neck   Pain Descriptors / Indicators Aching   Effect of Pain on Daily Activities Patient reports regular pain during daily activities due to aging.   Multiple Pain Sites No              PT Education -  05/15/14 1356    Education provided Yes   Education Details Re-emphasized need for brandt daroff habituation and frequency of treatment for home.   Person(s) Educated Patient   Methods Explanation;Demonstration   Comprehension Verbalized understanding;Returned demonstration              Plan - 05/15/14 1234    Clinical Impression Statement The patient has R BPPV noted per nystagmus with R sidelying test that is not responding consistently to canolith repositioning maneuvers.  He is not performing HEP for habituation as indicated due to concern his symptoms may feel worse.  PT and patient discussed need for regular habituation to improve symptoms and the patient agrees to perform.  If symptoms still do not clear, consider possible L sided vestibular hypofunction per L skew eye deviation noted with eye position observation.  PT to progress habituation and also include gaze and standing balance activities to determine if further HEP needed toimprove vestibular adaptation.                                                   PT Next Visit Plan re-check for BPPV, consider possible L hypofunction if symptoms continue to persist after regular habituation and home program.    PT Home Exercise Plan Continue brandt-daroff HEP   Consulted and Agree with Plan of Care Patient        Problem List Patient Active Problem List   Diagnosis Date Noted  . BPV (benign positional vertigo) 04/18/2014  . Aortic regurgitation 01/09/2014  . Angina, class III 12/12/2013  . Angina pectoris, crescendo 12/09/2013  . DOE (dyspnea on exertion) 10/15/2013  . Other malaise and fatigue 10/15/2013  . Neck pain 04/21/2013  . GERD 09/21/2009  . 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 ARTHRITIS, GENERALIZED 01/09/2009  . OSTEOPENIA 01/09/2009  . CAD S/P percutaneous coronary angioplasty - PCI of RCA (12/12/2013) 11/16/2008  . DIVERTICULOSIS, COLON 11/16/2008  . HYPERLIPIDEMIA 05/04/2007  . Essential hypertension 05/04/2007              Vestibular Assessment - 05/15/14 1205    General Observation --  Pt reports symptoms still present at times.   Type of Dizziness Spinning   and off balance sensations   Frequency of Dizziness --  daily   Duration of Dizziness --  seconds to minutes   Aggravating Factors --  bed mobility   Relieving Factors --  lying still   Sidelying Test Sidelying Right  sidelying left negative for nystagmus, brief dizziness   Sidelying Right Duration --  positive for upbeat, rotary nystagmus x 20 seconds.    Sidelying Right Symptoms Upbeat, right rotatory nystagmus  with sensation of room spinning          Vestibular Treatment/Exercise - 05/15/14 1203    Vestibular Treatment Provided Canalith Repositioning;Habituation;Gaze   Canalith Repositioning Epley Manuever Right   Habituation Exercises Austin Miles   Number of Reps  Of Epley's 2  performed second rep with vibration at position 1 and 3   Overall Response to Epley's Improved Symptoms   Response Details  --  pt with trace of symptoms remaining after 2 reps   Number of Reps of Brandt Daroff Habituation --  6 repetitions   Symptom Description  patient with  trace of nystagmus noted after epley's maneuver x 2 reps.  PT educated patient to continue brandt daroff and the reason for performing.  He has been avoiding exercise due to it bringing on symptoms                                   Margretta DittyChristina Nachman [name], PT, MPT 05/15/2014 2:14 PM [facility] Phone: [phone] Fax: [phone]   [name] 05/15/2014, 2:11 PM

## 2014-05-23 ENCOUNTER — Ambulatory Visit: Payer: Medicare Other | Admitting: Neurology

## 2014-05-24 ENCOUNTER — Encounter: Payer: Self-pay | Admitting: Neurology

## 2014-05-25 ENCOUNTER — Ambulatory Visit (INDEPENDENT_AMBULATORY_CARE_PROVIDER_SITE_OTHER): Payer: Medicare Other | Admitting: Neurology

## 2014-05-25 ENCOUNTER — Encounter (HOSPITAL_COMMUNITY): Payer: Self-pay | Admitting: Cardiovascular Disease

## 2014-05-25 VITALS — BP 127/78 | HR 60 | Resp 13

## 2014-05-25 DIAGNOSIS — G4731 Primary central sleep apnea: Secondary | ICD-10-CM | POA: Insufficient documentation

## 2014-05-25 MED ORDER — MELATONIN 1 MG PO TABS: ORAL_TABLET | ORAL | Status: DC
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
## 2014-05-25 NOTE — Patient Instructions (Signed)

## 2014-05-26 ENCOUNTER — Ambulatory Visit: Payer: Medicare Other | Attending: Internal Medicine | Admitting: Rehabilitative and Restorative Service Providers"

## 2014-05-26 DIAGNOSIS — Z5189 Encounter for other specified aftercare: Secondary | ICD-10-CM | POA: Diagnosis not present

## 2014-05-26 DIAGNOSIS — H8111 Benign paroxysmal vertigo, right ear: Secondary | ICD-10-CM

## 2014-05-26 NOTE — Therapy (Signed)
[facility] [address] [place], [place], [address] Phone: [phone]   Fax:  [phone]  Physical Therapy Treatment  Patient Details  Name: [name] MRN: [id number] Date of Birth: 10-Sep-1934  Encounter Date: 05/26/2014      PT End of Session - 05/26/14 1303    Visit Number 5   Number of Visits 8   Date for PT Re-Evaluation 05/25/14   PT Start Time 1240   PT Stop Time 1300   PT Time Calculation (min) 20 min   Activity Tolerance Patient tolerated treatment well      Past Medical History  Diagnosis Date  . Hyperlipidemia   . Hypertension   . Diverticulosis   . Gilbert's syndrome   . Osteopenia 2010     T score -1.9 @ hip (femoral neck)  . CAD S/P percutaneous coronary angioplasty 2003    Dr Percival Spanish: 2003: a) s/p CABG (LIMA-LAD, SVG-Diag);b) 2008: prior PCI to native RCA; c) 12/12/2013: SVG-Diag occluded, patent LIMA, 95% post stent in RCA --> PCI  with 2.75 x 18 mm Xience Alpine DES, residual dRCA-PLA 80=-90%, EF 55-65%  . PUD (peptic ulcer disease)     PMH , ? 1995  . Anginal pain   . Heart murmur   . Shortness of breath   . Short-term memory loss   . GERD (gastroesophageal reflux disease)   . Arthritis     BACK & SHOULDERS    Past Surgical History  Procedure Laterality Date  . Hemorrhoid surgery    . Cholecystectomy  2004  . Cervical fusion  1997    Dr Joya Salm  . Coronary artery bypass graft  2003    Last catheterization was in August 2008 demonstrated a LIMA to th LAD which was patent, there was an atretic saphenous vein graft to the diagonal, the LAD had a 90% stenosis in the large calcified segment, the diagonal has ostial 70% stenosis, the circumflex had a ramus intermediate with ostial 25% stenosis, the right coronary artery was dominant.  There was an ulcerated 80%-90% stenosis.    . Tonsillectomy and adenoidectomy    . Coronary stent placement  2008    drug-eluting stent to right coronary artery   . Upper gastrointestinal endoscopy       Dr Lyla Son  . Colonoscopy      Dr Lyla Son  . Coronary stent placement Right 12/12/2013    RT CORONARY  DES       DR COOPER   . Left heart catheterization with coronary/graft angiogram N/A 12/12/2013    Procedure: LEFT HEART CATHETERIZATION WITH Beatrix Fetters;  Surgeon: Blane Ohara, MD;  Location: [facility] CATH LAB;  Service: Cardiovascular;  Laterality: N/A;    There were no vitals taken for this visit.  Visit Diagnosis:  BPPV (benign paroxysmal positional vertigo), right      Subjective Assessment - 05/26/14 1241    Symptoms The patient reports 90% improvement in symptoms.  He is afraid this could return. He notes that dizziness is more stable.  When he does lying down, sitting up exercise he only notices trace symptoms on first repetition.     Currently in Pain? Yes   Pain Score 3    Pain Location Neck   Pain Orientation Right   Pain Descriptors / Indicators Aching   Pain Type Chronic pain    NEUROMUSCULAR RE-EDUCATION: Sit<>bilateral sidelying without nystagmus or dizziness.  Pt reports brief sensation of lightheadedness that settles within seconds of  moving sidelying to sitting.   SELF CARE/HOME MANAGEMENT: The patient was educated to stop brandt daroff at this time as no nystagmus or dizziness noted.  Recommended he hold onto exercises for future self management of symptoms (after being evalauated by MD).        PT Education - 05/26/14 1302    Education provided Yes   Education Details Recommended d/c and let patient know to keep habituation exercises if needed in the future.  Recommended only treating at home if he has undergone medical evaluation to ensure bertigo.   Person(s) Educated Patient   Methods Explanation   Comprehension Verbalized understanding            PT Long Term Goals - 05/26/14 1256    PT LONG TERM GOAL #1   Title Pt. will have a negative right Dix-hallpike test without nystagmus and  no c/o vertigo to indicate that R BPPV has resolved   Status Achieved   PT LONG TERM GOAL #2   Title Report no vertigo with bed mobility or ambulation   Status Achieved   PT LONG TERM GOAL #3   Title Independent in Brandt-Daroff exercises for habituation of R BPPV   Status Achieved          Plan - 05/26/14 1303    Clinical Impression Statement The patient has met all 3 LTGs and is able to be d/c from PT at this time.   PT Next Visit Plan d/c today from PT   Consulted and Agree with Plan of Care Patient          G-Codes - 05/26/14 1306    Functional Assessment Tool Used bed mobility and transfers with severity of vertigo reported   Other PT Primary Current Status (G8990) At least 1 percent but less than 20 percent impaired, limited or restricted   Other PT Primary Goal Status (G8991) At least 1 percent but less than 20 percent impaired, limited or restricted      Problem List Patient Active Problem List   Diagnosis Date Noted  . CSA (central sleep apnea) 05/25/2014  . BPV (benign positional vertigo) 04/18/2014  . Aortic regurgitation 01/09/2014  . Angina, class III 12/12/2013  . Angina pectoris, crescendo 12/09/2013  . DOE (dyspnea on exertion) 10/15/2013  . Other malaise and fatigue 10/15/2013  . Neck pain 04/21/2013  . GERD 09/21/2009  . 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 ARTHRITIS, GENERALIZED 01/09/2009  . OSTEOPENIA 01/09/2009  . CAD S/P percutaneous coronary angioplasty - PCI of RCA (12/12/2013) 11/16/2008  . DIVERTICULOSIS, COLON 11/16/2008  . HYPERLIPIDEMIA 05/04/2007  . Essential hypertension 05/04/2007     PHYSICAL THERAPY DISCHARGE SUMMARY  Visits from Start of Care: 5  Current functional level related to goals / functional outcomes: See above for goal status.   Remaining deficits: No dizziness with side<>bilateral sidelying.     Education / Equipment: Has HEP for future management if indicated.  Plan: Patient agrees  to discharge.  Patient goals were met. Patient is being discharged due to meeting the stated rehab goals.  ?????    Thank you for the referral of this patient.    , PT, MPT 05/26/2014 1:13 PM [facility] Phone: [phone] Fax: [phone]   , 05/26/2014, 1:09 PM      

## 2014-07-03 ENCOUNTER — Encounter: Payer: Self-pay | Admitting: Neurology

## 2014-07-28 ENCOUNTER — Telehealth: Payer: Self-pay

## 2014-07-28 NOTE — Telephone Encounter (Signed)
Received clearance form to have teeth extractions from Dr.Sidney Teague.Dr.Jordan signed form and faxed back to fax # [phone].

## 2014-08-04 ENCOUNTER — Telehealth: Payer: Self-pay

## 2014-08-04 NOTE — Telephone Encounter (Signed)
Received surgical clearance from [facility] No SBE needed.Hold Plavix 7 days prior to procedure,needs to remain on aspirin. Dr.Jordan signed form.Form faxed back to fax # [phone].

## 2014-08-07 ENCOUNTER — Telehealth: Payer: Self-pay | Admitting: Cardiology

## 2014-08-07 NOTE — Telephone Encounter (Signed)
[name] is calling because she has some questions about a release form that was sent back in September and sent in another release and they receive it and in comparing the new to the old she has some questions . Please call   Thanks

## 2014-08-07 NOTE — Telephone Encounter (Signed)
Returned call to [facility].Advised to follow instructions on form that was faxed back to them 08/04/14.

## 2014-08-08 ENCOUNTER — Telehealth: Payer: Self-pay | Admitting: *Deleted

## 2014-08-08 NOTE — Telephone Encounter (Signed)
Returned call to patient Dr.Jordan advised since he is having 5 extractions ok to hold plavix 5 days before.[facility] form completed and faxed back to fax # [phone].

## 2014-08-08 NOTE — Telephone Encounter (Signed)
Patient wanted to know if he can stop plavix prior to dental extraction. Will forward for dr jordan's review.

## 2014-08-08 NOTE — Telephone Encounter (Signed)
He is s/p stenting in June of 2015. For most dental extractions the bleeding risk is low and I would recommend he not stop Plavix. If extensive extractions are planned with higher bleeding risk then Plavix could be held for 5 days.  Peter SwazilandJordan MD, [facility]

## 2014-09-13 ENCOUNTER — Ambulatory Visit (INDEPENDENT_AMBULATORY_CARE_PROVIDER_SITE_OTHER): Payer: Medicare Other | Admitting: Cardiology

## 2014-09-13 ENCOUNTER — Encounter: Payer: Self-pay | Admitting: Cardiology

## 2014-09-13 VITALS — BP 128/72 | HR 72 | Ht 65.0 in | Wt 158.0 lb

## 2014-09-13 DIAGNOSIS — E782 Mixed hyperlipidemia: Secondary | ICD-10-CM | POA: Diagnosis not present

## 2014-09-13 DIAGNOSIS — I2510 Atherosclerotic heart disease of native coronary artery without angina pectoris: Secondary | ICD-10-CM | POA: Diagnosis not present

## 2014-09-13 DIAGNOSIS — R0600 Dyspnea, unspecified: Secondary | ICD-10-CM

## 2014-09-13 DIAGNOSIS — Z9861 Coronary angioplasty status: Secondary | ICD-10-CM | POA: Diagnosis not present

## 2014-09-13 DIAGNOSIS — R0609 Other forms of dyspnea: Secondary | ICD-10-CM | POA: Diagnosis not present

## 2014-09-13 NOTE — Patient Instructions (Addendum)
We will taper off your metoprolol. Take 12.5 mg once a day for 5 days then discontinue completely  Continue your other therapy  I will see you in 6 months.

## 2014-09-13 NOTE — Progress Notes (Signed)
Matthew LopesJuan A [name] Date of Birth: 10-Dec-1934 Medical Record #[id number]  History of Present Illness: [name] is seen for follow up CAD. He is s/p CABG in 2003 by Dr. Cornelius Moraswen with LIMA to the LAD and SVG to the Diagonal. By cath in 2008 he had a high grade proximal RCA stenosis and had stenting with a 2.75 mm Promus stent by Dr. Juanda ChanceBrodie. He was noted to have an atretic SVG to the diagonal but it was felt that antegrade flow into the diagonal was OK. His last stress Myoview in November 2012 showed a fixed apical defect without ischemia and EF 62%.  In May 2015 he presented with unstable angina. Cardiac cath showed patent LIMA to the LAD and occlusion of SVG to the diagonal. There was a severe stenosis in the RCA which was stented with a DES. He also had an Echo in 9/15 for a murmur and has AV sclerosis with mild to moderate AI.  On follow up today he is doing fairly well. He does note more fatigue and gets tired easily. No chest pain or dyspnea. He did have vertigo that resolved with vestibular PT.     Medication List       This list is accurate as of: 09/13/14  5:48 PM.  Always use your most recent med list.               aspirin EC 81 MG tablet  Take 81 mg by mouth daily.     clopidogrel 75 MG tablet  Commonly known as:  PLAVIX  Take 1 tablet (75 mg total) by mouth daily with breakfast.     fluticasone 27.5 MCG/SPRAY nasal spray  Commonly known as:  VERAMYST  Place 1 spray into both nostrils daily. AS NEEDED     loratadine 10 MG tablet  Commonly known as:  CLARITIN  Take 10 mg by mouth daily.     nitroGLYCERIN 0.4 MG SL tablet  Commonly known as:  NITROSTAT  Place 0.4 mg under the tongue every 5 (five) minutes as needed for chest pain.     rosuvastatin 20 MG tablet  Commonly known as:  CRESTOR  Take 20 mg by mouth at bedtime.         Allergies  Allergen Reactions  . Codeine Nausea Only    Past Medical History  Diagnosis Date  . Hyperlipidemia   . Hypertension     . Diverticulosis   . Gilbert's syndrome   . Osteopenia 2010     T score -1.9 @ hip (femoral neck)  . CAD S/P percutaneous coronary angioplasty 2003    Dr Antoine PocheHochrein: 2003: a) s/p CABG (LIMA-LAD, SVG-Diag);b) 2008: prior PCI to native RCA; c) 12/12/2013: SVG-Diag occluded, patent LIMA, 95% post stent in RCA --> PCI  with 2.75 x 18 mm Xience Alpine DES, residual dRCA-PLA 80=-90%, EF 55-65%  . PUD (peptic ulcer disease)     PMH , ? 1995  . Anginal pain   . Heart murmur   . Shortness of breath   . Short-term memory loss   . GERD (gastroesophageal reflux disease)   . Arthritis     BACK & SHOULDERS    Past Surgical History  Procedure Laterality Date  . Hemorrhoid surgery    . Cholecystectomy  2004  . Cervical fusion  1997    Dr Jeral FruitBotero  . Coronary artery bypass graft  2003    Last catheterization was in August 2008 demonstrated a LIMA to th LAD which was  patent, there was an atretic saphenous vein graft to the diagonal, the LAD had a 90% stenosis in the large calcified segment, the diagonal has ostial 70% stenosis, the circumflex had a ramus intermediate with ostial 25% stenosis, the right coronary artery was dominant.  There was an ulcerated 80%-90% stenosis.    . Tonsillectomy and adenoidectomy    . Coronary stent placement  2008    drug-eluting stent to right coronary artery  . Upper gastrointestinal endoscopy       Dr Victorino Dike  . Colonoscopy      Dr Victorino Dike  . Coronary stent placement Right 12/12/2013    RT CORONARY  DES       DR COOPER   . Left heart catheterization with coronary/graft angiogram N/A 12/12/2013    Procedure: LEFT HEART CATHETERIZATION WITH Isabel Caprice;  Surgeon: Micheline Chapman, MD;  Location: [facility] CATH LAB;  Service: Cardiovascular;  Laterality: N/A;    History   Social History  . Marital Status: Married    Spouse Name: [name]  . Number of Children: 2  . Years of Education: Bachelors   Occupational History  .     Social History Main  Topics  . Smoking status: Former Smoker    Quit date: 02/25/1979  . Smokeless tobacco: Never Used     Comment: smoked 1955-1980, up to 1 ppd  . Alcohol Use: Yes  . Drug Use: No  . Sexual Activity: Not on file   Other Topics Concern  . 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   Patient has two adult children.   Patient is retired.   Patient has a Bachelor's degree.   Patient is right-handed.   Patient drinks 1/2 liter of green tea daily.   Walks once week for exercise    Family History  Problem Relation Age of Onset  . Cancer Father     ? primary  . Uterine cancer Daughter   . Hypertension Maternal Grandmother   . Osteoporosis Sister   . Diabetes Sister   . Stroke Neg Hx   . Peripheral vascular disease Maternal Grandmother   . Heart attack Neg Hx     Review of Systems: As noted in HPI.  All other systems were reviewed and are negative.  Physical Exam: BP 128/72 mmHg  Pulse 72  Ht  (1.651 m)  Wt 158 lb (71.668 kg)  BMI 26.29 kg/m2 Filed Weights   09/13/14 1611  Weight: 158 lb (71.668 kg)  GENERAL:  Well appearing WM in NAD. HEENT:  PERRL, EOMI, sclera are clear. Oropharynx is clear. NECK:  No jugular venous distention, carotid upstroke brisk and symmetric, no bruits, no thyromegaly or adenopathy LUNGS:  Clear to auscultation bilaterally CHEST:  Unremarkable HEART:  RRR,  PMI not displaced or sustained,S1 and S2 within normal limits, Grade 2/6 harsh systolic murmur at the apex with soft diastolic murmur at the LSB.  ABD:  Soft, nontender. BS +, no masses or bruits. No hepatomegaly, no splenomegaly EXT:  2 + pulses throughout, no edema, no cyanosis no clubbing SKIN:  Warm and dry.  No rashes NEURO:  Alert and oriented x 3. Cranial nerves II through XII intact. PSYCH:  Cognitively intact    LABORATORY DATA:  Lab Results  Component Value Date   WBC 9.5 01/15/2014   HGB 16.2 01/15/2014   HCT 45.5  01/15/2014   PLT 200 01/15/2014  GLUCOSE 121* 01/15/2014   CHOL 134 10/14/2013   TRIG 69.0 10/14/2013   HDL 48.10 10/14/2013   LDLCALC 72 10/14/2013   ALT 20 01/15/2014   AST 21 01/15/2014   NA 138 01/15/2014   K 4.2 01/15/2014   CL 98 01/15/2014   CREATININE 0.70 01/15/2014   BUN 15 01/15/2014   CO2 26 01/15/2014   TSH 1.20 10/14/2013   PSA 0.98 06/04/2006   INR 0.98 01/15/2014   HGBA1C 5.7 02/13/2011     Assessment / Plan: 1. CAD. Known CAD s/p CABG in 2003 and subsequent stenting of native RCA 2008. DES of the proximal RCA in June 2015. Recommend continued DAPT indefinitely as long as he is not having any bleeding problems.    2. Mild to moderate AI.  Normal LV size and function. Asymptomatic. Will follow every 4-5 years unless status changes.   3. Hyperlipidemia- on statin. He is due for fasting lab work with Dr. Alwyn Ren in May.  4. Fatigue. May be related to beta blocker therapy. He has been on metoprolol long term and is on a low dose. He denies history of HTN. Will taper off metoprolol and see if symptoms will improve. He will check BP. Encourage increased aerobic activity.

## 2014-10-09 ENCOUNTER — Telehealth: Payer: Self-pay | Admitting: Cardiology

## 2014-10-09 MED ORDER — ROSUVASTATIN CALCIUM 20 MG PO TABS: 20.0000 mg | ORAL_TABLET | Freq: Every day | ORAL | Status: DC

## 2014-10-09 NOTE — Telephone Encounter (Signed)
Spoke to pt. He needed PA or possibly tier exception - not sure - he will come this week to drop off paperwork he received from insurance co.  I had a sample box of Crestor, will leave for him - pt aware.

## 2014-10-09 NOTE — Telephone Encounter (Signed)
Please call,concerning his prescription for his Crestor please.He needs to give you the details.

## 2014-10-09 NOTE — Telephone Encounter (Signed)
No answer when called 

## 2014-10-10 NOTE — Telephone Encounter (Signed)
Pt came for samples, had paper for financial assistance from [employer].  Paper needed physician sig - obtained this from [name] after filling out required information. Handed back to pt along w/ samples of Crestor.   Pt expressed appreciation for the prompt help.

## 2014-10-19 ENCOUNTER — Ambulatory Visit (INDEPENDENT_AMBULATORY_CARE_PROVIDER_SITE_OTHER): Payer: Medicare Other | Admitting: Internal Medicine

## 2014-10-19 ENCOUNTER — Encounter: Payer: Self-pay | Admitting: Internal Medicine

## 2014-10-19 VITALS — BP 140/82 | HR 80 | Temp 97.5°F | Resp 15 | Ht 65.0 in | Wt 158.5 lb

## 2014-10-19 DIAGNOSIS — IMO0001 Reserved for inherently not codable concepts without codable children: Secondary | ICD-10-CM

## 2014-10-19 DIAGNOSIS — I10 Essential (primary) hypertension: Secondary | ICD-10-CM | POA: Diagnosis not present

## 2014-10-19 DIAGNOSIS — Z23 Encounter for immunization: Secondary | ICD-10-CM

## 2014-10-19 DIAGNOSIS — Z0000 Encounter for general adult medical examination without abnormal findings: Secondary | ICD-10-CM

## 2014-10-19 DIAGNOSIS — M791 Myalgia: Secondary | ICD-10-CM

## 2014-10-19 DIAGNOSIS — K219 Gastro-esophageal reflux disease without esophagitis: Secondary | ICD-10-CM | POA: Diagnosis not present

## 2014-10-19 DIAGNOSIS — M5416 Radiculopathy, lumbar region: Secondary | ICD-10-CM | POA: Insufficient documentation

## 2014-10-19 DIAGNOSIS — E782 Mixed hyperlipidemia: Secondary | ICD-10-CM

## 2014-10-19 DIAGNOSIS — M609 Myositis, unspecified: Secondary | ICD-10-CM

## 2014-10-19 NOTE — Progress Notes (Signed)
Subjective:    Patient ID: Matthew LopesJuan A Shawn, male    DOB: [birth year], 80 y.o.   MRN: [id number]  HPI Medicare Wellness Visit: Psychosocial and medical history were reviewed as required by Medicare (history related to abuse, antisocial behavior , firearm risk). Social history: Caffeine: 30 g choc/day Alcohol: 1/2 "cup" /day Tobacco NWG:NFAOuse:quit 1980 Exercise:no Personal safety/fall risk:no Limitations of activities of daily living:no Seatbelt/ smoke alarm use:yes Healthcare Power of Attorney/Living Will status and End of Life process assessment : not UTD;indications discussed Ophthalmologic exam status:due Hearing evaluation status:not UTD Orientation: Oriented X 3 Memory and recall: good Math testing: good Depression/anxiety assessment: some as "frustration adapting to being 4177" Foreign travel history: [place] 1.5 years ago Immunization status for influenza/pneumonia/ shingles /tetanus:both PNA & Shingles shot needed Transfusion history: 2003  Preventive health care maintenance status: Colonoscopy as per protocol/standard care:/ aged out Dental care:dentures Chart reviewed and updated. Active issues reviewed and addressed as documented below.    He is on a heart healthy diet. He is not on exercise program due to multiple co-morbidities , mainly neuro/musculoskeltal in RLE. His exercise mainly has been affected recently ( last 2-3 weeks ) by neurogenic claudication type symptoms with radicular pain in the right lower extremity after walking 70-80 feet.  He describes associated weakness. There is no definite trigger for these symptoms and they seem to resolve spontaneously.  He thought that the beta blocker was causing fatigue and he has stopped it. Blood pressure off this medicine has been 116-137 over 58-81. He does have some exertional dyspnea.   He does not have paroxysmal nocturnal dyspnea; he is on CPAP.  He's had some floaters with some impact on the vision in his left eye. He is  due for ophthalmologic exam.  He describes some dyspepsia and gas. Colonoscopy is not up-to-date; he may have aged out.    Review of Systems Significant headaches, epistaxis, chest pain, palpitations, vascular claudication, paroxysmal nocturnal dyspnea, or edema absent. Some memory loss .Some leg myalgias.Compliant with the statin.   Unexplained weight loss, abdominal pain, dysphagia, melena, rectal bleeding, or persistently small caliber stools are denied.    He denies numbness or tingling in the right lower extremity. He has no loss of control of bladder or bowels.    Objective:   Physical Exam Gen.: Adequately nourished in appearance. Alert, appropriate and cooperative throughout exam. BMI: Appears younger than stated age  Head: Normocephalic without obvious abnormalities; pattern alopecia  Eyes: No corneal or conjunctival inflammation noted. Pupils equal round reactive to light and accommodation. Extraocular motion intact. Arcus Ears: External  ear exam reveals no significant lesions or deformities. Canals clear .TMs normal. Hearing is grossly decreased on R.TMs scarred Nose: External nasal exam reveals no deformity or inflammation. Nasal mucosa are pink and moist. No lesions or exudates noted.   Mouth: Oral mucosa and oropharynx reveal no lesions or exudates. Teeth in good repair. Neck: No deformities, masses, or tenderness noted. Range of motion & Thyroid normal. Lungs: Normal respiratory effort; chest expands symmetrically. Lungs are clear to auscultation without rales, wheezes, or increased work of breathing. Heart: Normal rate and rhythm. Normal S1 and S2. No gallop, click, or rub.Grade 1/6 systolic murmur Abdomen: Bowel sounds normal; abdomen soft and nontender. No masses, organomegaly or hernias noted. Genitalia: deferred (age 80)                                Musculoskeletal/extremities:  Accentuated curvature of upper thoracic spine with stooped posture No clubbing,  cyanosis, edema, or significant extremity  deformity noted.  Range of motion normal decreased in LE. Tone & strength normal. Hand joints normal  Fingernail  health good. Crepitus of knees  Able to lie down & sit up w/o help.  Negative SLR bilaterally Vascular: Carotid, radial artery, dorsalis pedis and  posterior tibial pulses are full and equal. No bruits present. Neurologic: Alert and oriented x3. Deep tendon reflexes symmetrical and normal.  Gait stooped       Skin: Intact without suspicious lesions or rashes. Lymph: No cervical, axillary lymphadenopathy present. Psych: Mood and affect are normal. Normally interactive                                                                                       Assessment & Plan:  See Current Assessment & Plan in Problem List under specific DiagnosisThe labs will be reviewed and risks and options assessed. Written recommendations will be provided by mail or directly through My Chart.Further evaluation or change in medical therapy will be directed by those results.

## 2014-10-19 NOTE — Assessment & Plan Note (Signed)
CBC & dif  Anti reflux measures Zantac  pre b'fast & pre eve meal LFTs

## 2014-10-19 NOTE — Patient Instructions (Addendum)
  Your next office appointment will be determined based upon review of your pending labs  . Those instructions will be transmitted to you by My Chart Critical results will be called.   Followup as needed for any active or acute issue. Please report any significant change in your symptoms  Minimal Blood Pressure Goal= AVERAGE < 140/90;  Ideal is an AVERAGE < 135/85. This AVERAGE should be calculated from @ least 5-7 BP readings taken @ different times of day on different days of week. You should not respond to isolated BP readings , but rather the AVERAGE for that week .Please bring your  blood pressure cuff to office visits to verify that it is reliable.It  can also be checked against the blood pressure device at the pharmacy. Finger or wrist cuffs are not dependable; an arm cuff is.  Reflux of gastric acid may be asymptomatic as this may occur mainly during sleep.The triggers for reflux  include stress; the "aspirin family" ; alcohol; peppermint; and caffeine (coffee, tea, cola, and chocolate). The aspirin family would include aspirin and the nonsteroidal agents such as ibuprofen &  Naproxen. Tylenol would not cause reflux. If having symptoms ; food & drink should be avoided for @ least 2 hours before going to bed. Take Zantac 150 mg 30 minutes before breakfast and 30 minutes before the evening meal for 8 weeks if having symptoms  The best exercises for the low back include freestyle swimming, stretch aerobics, and yoga.  Colonoscopy not up to date;but survelliance usually stopped after 74 unless active GI symptoms or anemia present.

## 2014-10-19 NOTE — Progress Notes (Signed)
Pre visit review using our clinic review tool, if applicable. No additional management support is needed unless otherwise documented below in the visit note. 

## 2014-10-19 NOTE — Assessment & Plan Note (Addendum)
The best exercises for the low back include freestyle swimming, stretch aerobics, and yoga. If lumbar radiculopathy progressive or persistent: imaging;Gabapentin trial;& PT referral

## 2014-10-19 NOTE — Assessment & Plan Note (Signed)
Lipids, LFTs, TSH ,CK 

## 2014-10-19 NOTE — Assessment & Plan Note (Signed)
Off B blocker Blood pressure goals reviewed. BMET

## 2014-10-21 ENCOUNTER — Encounter: Payer: Self-pay | Admitting: Internal Medicine

## 2014-10-24 ENCOUNTER — Other Ambulatory Visit (INDEPENDENT_AMBULATORY_CARE_PROVIDER_SITE_OTHER): Payer: Medicare Other

## 2014-10-24 DIAGNOSIS — E782 Mixed hyperlipidemia: Secondary | ICD-10-CM | POA: Diagnosis not present

## 2014-10-24 DIAGNOSIS — M791 Myalgia: Secondary | ICD-10-CM | POA: Diagnosis not present

## 2014-10-24 DIAGNOSIS — K219 Gastro-esophageal reflux disease without esophagitis: Secondary | ICD-10-CM

## 2014-10-24 DIAGNOSIS — M609 Myositis, unspecified: Secondary | ICD-10-CM | POA: Diagnosis not present

## 2014-10-24 DIAGNOSIS — I10 Essential (primary) hypertension: Secondary | ICD-10-CM | POA: Diagnosis not present

## 2014-10-24 DIAGNOSIS — IMO0001 Reserved for inherently not codable concepts without codable children: Secondary | ICD-10-CM

## 2014-10-24 LAB — TSH: TSH: 1.69 u[IU]/mL (ref 0.35–4.50)

## 2014-10-24 LAB — CBC WITH DIFFERENTIAL/PLATELET
Basophils Absolute: 0 10*3/uL (ref 0.0–0.1)
Basophils Relative: 0.4 % (ref 0.0–3.0)
Eosinophils Absolute: 0.6 10*3/uL (ref 0.0–0.7)
Eosinophils Relative: 9.5 % — ABNORMAL HIGH (ref 0.0–5.0)
HCT: 44.6 % (ref 39.0–52.0)
HEMOGLOBIN: 15.7 g/dL (ref 13.0–17.0)
Lymphocytes Relative: 23 % (ref 12.0–46.0)
Lymphs Abs: 1.4 10*3/uL (ref 0.7–4.0)
MCHC: 35.3 g/dL (ref 30.0–36.0)
MCV: 86.5 fl (ref 78.0–100.0)
MONO ABS: 0.6 10*3/uL (ref 0.1–1.0)
Monocytes Relative: 9.6 % (ref 3.0–12.0)
NEUTROS ABS: 3.4 10*3/uL (ref 1.4–7.7)
Neutrophils Relative %: 57.5 % (ref 43.0–77.0)
Platelets: 240 10*3/uL (ref 150.0–400.0)
RBC: 5.15 Mil/uL (ref 4.22–5.81)
RDW: 13.5 % (ref 11.5–15.5)
WBC: 6 10*3/uL (ref 4.0–10.5)

## 2014-10-24 LAB — BASIC METABOLIC PANEL
BUN: 16 mg/dL (ref 6–23)
CALCIUM: 9.7 mg/dL (ref 8.4–10.5)
CO2: 27 mEq/L (ref 19–32)
Chloride: 103 mEq/L (ref 96–112)
Creatinine, Ser: 0.84 mg/dL (ref 0.40–1.50)
GFR: 94.06 mL/min (ref >60.00)
Glucose, Bld: 101 mg/dL — ABNORMAL HIGH (ref 70–99)
Potassium: 4.3 mEq/L (ref 3.5–5.1)
SODIUM: 137 meq/L (ref 135–145)

## 2014-10-24 LAB — CK: Total CK: 141 U/L (ref 7–232)

## 2014-10-24 LAB — HEPATIC FUNCTION PANEL
ALK PHOS: 64 U/L (ref 39–117)
ALT: 15 U/L (ref 0–53)
AST: 16 U/L (ref 0–37)
Albumin: 4.3 g/dL (ref 3.5–5.2)
BILIRUBIN TOTAL: 0.8 mg/dL (ref 0.2–1.2)
Bilirubin, Direct: 0.2 mg/dL (ref 0.0–0.3)
Total Protein: 7.6 g/dL (ref 6.0–8.3)

## 2014-10-24 LAB — LIPID PANEL
Cholesterol: 147 mg/dL (ref 0–200)
HDL: 47.3 mg/dL (ref >39.00)
LDL CALC: 81 mg/dL (ref 0–99)
NonHDL: 99.7
TRIGLYCERIDES: 94 mg/dL (ref 0.0–149.0)
Total CHOL/HDL Ratio: 3
VLDL: 18.8 mg/dL (ref 0.0–40.0)

## 2014-10-26 ENCOUNTER — Other Ambulatory Visit (INDEPENDENT_AMBULATORY_CARE_PROVIDER_SITE_OTHER): Payer: Medicare Other

## 2014-10-26 ENCOUNTER — Telehealth: Payer: Self-pay | Admitting: Internal Medicine

## 2014-10-26 ENCOUNTER — Other Ambulatory Visit: Payer: Self-pay | Admitting: Internal Medicine

## 2014-10-26 ENCOUNTER — Telehealth: Payer: Self-pay

## 2014-10-26 DIAGNOSIS — R739 Hyperglycemia, unspecified: Secondary | ICD-10-CM

## 2014-10-26 DIAGNOSIS — H8110 Benign paroxysmal vertigo, unspecified ear: Secondary | ICD-10-CM

## 2014-10-26 LAB — HEMOGLOBIN A1C: HEMOGLOBIN A1C: 5.6 % (ref 4.6–6.5)

## 2014-10-26 NOTE — Telephone Encounter (Signed)
Pt is scheduled for 10/27/14.

## 2014-10-26 NOTE — Telephone Encounter (Signed)
[facility] has called requesting referral to come to them.  [name]

## 2014-10-26 NOTE — Telephone Encounter (Signed)
Patient would like a referral to rehabilitation center for his reoccuring vertigo.

## 2014-10-26 NOTE — Telephone Encounter (Signed)
Patient states he can not wait two weeks for a referral.  He is requesting an urgent referral to be placed.

## 2014-10-26 NOTE — Telephone Encounter (Signed)
-----   Message from Pecola LawlessWilliam F Hopper, MD sent at 10/26/2014  1:26 PM EDT ----- Please add A1c (R73.9)

## 2014-10-26 NOTE — Telephone Encounter (Signed)
Please relate message to Physical therapy

## 2014-10-26 NOTE — Telephone Encounter (Signed)
Request for add on has been faxed to lab 

## 2014-10-27 ENCOUNTER — Ambulatory Visit: Payer: Medicare Other | Attending: Internal Medicine | Admitting: Rehabilitative and Restorative Service Providers"

## 2014-10-27 DIAGNOSIS — H8111 Benign paroxysmal vertigo, right ear: Secondary | ICD-10-CM

## 2014-10-27 NOTE — Therapy (Signed)
[facility] [address] [place], [place], [address] Phone: [phone]   Fax:  [phone]  Physical Therapy Evaluation  Patient Details  Name: [name] MRN: [id number] Date of Birth: 17-Nov-1934 Referring Provider:  Pecola LawlessHopper, William F, MD  Encounter Date: 10/27/2014      PT End of Session - 10/27/14 1643    Visit Number 1   Number of Visits 8   Date for PT Re-Evaluation 12/27/14   Authorization Type G code every 10th visit   PT Start Time 0935   PT Stop Time 1015   PT Time Calculation (min) 40 min   Activity Tolerance Patient tolerated treatment well   Behavior During Therapy [facility] for tasks assessed/performed      Past Medical History  Diagnosis Date  . Hyperlipidemia   . Hypertension   . Diverticulosis   . Gilbert's syndrome   . Osteopenia 2010     T score -1.9 @ hip (femoral neck)  . CAD S/P percutaneous coronary angioplasty 2003    Dr Antoine PocheHochrein: 2003: a) s/p CABG (LIMA-LAD, SVG-Diag);b) 2008: prior PCI to native RCA; c) 12/12/2013: SVG-Diag occluded, patent LIMA, 95% post stent in RCA --> PCI  with 2.75 x 18 mm Xience Alpine DES, residual dRCA-PLA 80=-90%, EF 55-65%  . PUD (peptic ulcer disease)     PMH , ? 1995  . Anginal pain   . Heart murmur   . Shortness of breath   . Short-term memory loss   . GERD (gastroesophageal reflux disease)   . Arthritis     BACK & SHOULDERS    Past Surgical History  Procedure Laterality Date  . Hemorrhoid surgery    . Cholecystectomy  2004  . Cervical fusion  1997    Dr Jeral FruitBotero  . Coronary artery bypass graft  2003    Last catheterization was in August 2008 demonstrated a LIMA to th LAD which was patent, there was an atretic saphenous vein graft to the diagonal, the LAD had a 90% stenosis in the large calcified segment, the diagonal has ostial 70% stenosis, the circumflex had a ramus intermediate with ostial 25% stenosis, the right coronary artery was dominant.   There was an ulcerated 80%-90% stenosis.    . Tonsillectomy and adenoidectomy    . Coronary stent placement  2008    drug-eluting stent to right coronary artery  . Upper gastrointestinal endoscopy       Dr Victorino DikeSam [place]  . Colonoscopy      Dr Victorino DikeSam [place]  . Coronary stent placement Right 12/12/2013    RT CORONARY  DES       DR COOPER   . Left heart catheterization with coronary/graft angiogram N/A 12/12/2013    Procedure: LEFT HEART CATHETERIZATION WITH Isabel CapriceORONARY/GRAFT ANGIOGRAM;  Surgeon: Micheline ChapmanMichael D Cooper, MD;  [facility];  Service: Cardiovascular;  Laterality: N/A;    There were no vitals filed for this visit.  Visit Diagnosis:  BPPV (benign paroxysmal positional vertigo), right      Subjective Assessment - 10/27/14 0944    Subjective The patient reports return of dizziness 2 days ago when waking.  He reports a sensation of head pressure in the back of his head, imbalance, lightheaded, denies spinning sensation.  It lasts for 15-30 seconds and then improves.  Yesterday, he felt back for a couple of hours after the initial episode.   Patient Stated Goals resolve the vertigo   Currently in Pain? No/denies  [facility] PT Assessment - 10/27/14 0947    Assessment   Medical Diagnosis BPPV recurrence   Onset Date --  10/25/2014   Prior Therapy at our clinic in 04/2014   Balance Screen   Has the patient fallen in the past 6 months No   Has the patient had a decrease in activity level because of a fear of falling?  No   Is the patient reluctant to leave their home because of a fear of falling?  No   Prior Function   Level of Independence Independent with homemaking with ambulation  independent with all mobility            Vestibular Assessment - 10/27/14 0948    Symptom Behavior   Type of Dizziness Lightheadedness   Frequency of Dizziness daily   Duration of Dizziness seconds   Aggravating Factors Mornings;Turning body quickly;Turning head quickly    Relieving Factors Lying supine;Head stationary   Occulomotor Exam   Occulomotor Alignment Abnormal  ? mild R hyperopia   Spontaneous Absent   Gaze-induced Absent   Smooth Pursuits Intact   Saccades Intact   Vestibulo-Occular Reflex   VOR 1 Head Only (x 1 viewing) --  at self regulated pace provokes 3/10 symptoms   Comment head thrust test unable to perform due to neck stiffness   Positional Testing   Dix-Hallpike Dix-Hallpike Right;Dix-Hallpike Left   Sidelying Test Sidelying Right;Sidelying Left   Horizontal Canal Testing Horizontal Canal Right;Horizontal Canal Left   Sidelying Right   Sidelying Right Duration --  30+ seconds   Sidelying Right Symptoms Upbeat, right rotatory nystagmus   Sidelying Left   Sidelying Left Symptoms No nystagmus   Horizontal Canal Right   Horizontal Canal Right Symptoms Normal   Horizontal Canal Left   Horizontal Canal Left Symptoms Normal           Vestibular Treatment/Exercise - 10/27/14 1006    Vestibular Treatment/Exercise   Vestibular Treatment Provided Canalith Repositioning;Habituation   Canalith Repositioning Epley Manuever Right   Habituation Exercises Gari Crown Daroff   Number of Reps  3   Symptom Description  emphasized reason for performing and that exercise should  bring on dizziness               PT Education - 10/27/14 1011    Education provided Yes   Education Details HEP: habituation sit<>bilateral sidelying provided   Person(s) Educated Patient   Methods Explanation;Demonstration;Handout   Comprehension Returned demonstration;Verbalized understanding          PT Short Term Goals - 04/25/14 1534    PT SHORT TERM GOAL #1   Title same as LTG's           PT Long Term Goals - 10/27/14 1644    PT LONG TERM GOAL #1   Title Pt. will have a negative right Dix-hallpike test without nystagmus and no c/o vertigo to indicate that R BPPV has resolved   Time 4   Period Weeks   PT LONG TERM GOAL #2    Title Report no vertigo with bed mobility or ambulation   Time 4   Period Weeks   PT LONG TERM GOAL #3   Title Independent in Brandt-Daroff exercises for habituation of R BPPV   Time 4   Period Weeks               Plan - 10/27/14 1644    Clinical Impression Statement The patient presents today with recurrence of prior  BPPV.  Patient tolerated treatment well and was instructed to return to brandt daroff habituation if not resolved after today's treatment.   Pt will benefit from skilled therapeutic intervention in order to improve on the following deficits Other (comment);Abnormal gait;Decreased balance  vertigo   Rehab Potential Good   PT Frequency 1x / week   PT Duration 8 weeks   PT Treatment/Interventions Other (comment);Patient/family education;Neuromuscular re-education;Balance training;Therapeutic exercise  vestibular rehab   PT Next Visit Plan Check HEP habituation, recheck for BPPV   Consulted and Agree with Plan of Care Patient          G-Codes - 10/27/14 1646    Functional Assessment Tool Used vertigo with bed mobility   Functional Limitation Self care   Self Care Current Status ([id number]) At least 20 percent but less than 40 percent impaired, limited or restricted   Self Care Goal Status (U0454(G8988) At least 1 percent but less than 20 percent impaired, limited or restricted       Problem List Patient Active Problem List   Diagnosis Date Noted  . Right lumbar radiculopathy 10/19/2014  . CSA (central sleep apnea) 05/25/2014  . BPV (benign positional vertigo) 04/18/2014  . Aortic regurgitation 01/09/2014  . Angina, class III 12/12/2013  . Angina pectoris, crescendo 12/09/2013  . DOE (dyspnea on exertion) 10/15/2013  . Other malaise and fatigue 10/15/2013  . GERD 09/21/2009  . 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 ARTHRITIS, GENERALIZED 01/09/2009  . OSTEOPENIA 01/09/2009  . CAD S/P percutaneous coronary angioplasty - PCI of RCA  (12/12/2013) 11/16/2008  . DIVERTICULOSIS, COLON 11/16/2008  . HYPERLIPIDEMIA 05/04/2007  . Essential hypertension 05/04/2007    Chenay Nesmith, PT 10/27/2014, 4:47 PM  [facility] [address] [place], [place], [address] Phone: [phone]   Fax:  [phone]

## 2014-10-27 NOTE — Patient Instructions (Addendum)
BEGIN ON Sunday IF YOU ARE STILL EXPERIENCING DIZZINESS:  Tip Card 1.The goal of habituation training is to assist in decreasing symptoms of vertigo, dizziness, or nausea provoked by specific head and body motions. 2.These exercises may initially increase symptoms; however, be persistent and work through symptoms. With repetition and time, the exercises will assist in reducing or eliminating symptoms. 3.Exercises should be stopped and discussed with the therapist if you experience any of the following: - Sudden change or fluctuation in hearing - New onset of ringing in the ears, or increase in current intensity - Any fluid discharge from the ear - Severe pain in neck or back - Extreme nausea  Copyright  VHI. All rights reserved.   Sit to Side-Lying   Sit on edge of bed. Lie down onto the right side and hold until dizziness stops, plus 20 seconds.  Return to sitting and wait until dizziness stops, plus 20 seconds.  Repeat to the left side. Repeat sequence 5 times per session. Do 2-3 sessions per day.  Copyright  VHI. All rights reserved.

## 2014-10-31 ENCOUNTER — Encounter: Payer: Self-pay | Admitting: Physical Therapy

## 2014-10-31 ENCOUNTER — Ambulatory Visit: Payer: Medicare Other | Admitting: Physical Therapy

## 2014-10-31 DIAGNOSIS — H8111 Benign paroxysmal vertigo, right ear: Secondary | ICD-10-CM | POA: Diagnosis not present

## 2014-10-31 NOTE — Therapy (Signed)
[facility] [address] [place], [place], [address] Phone: [phone]   Fax:  [phone]  Physical Therapy Treatment  Patient Details  Name: [name] MRN: [id number] Date of Birth: 26-Sep-1934 Referring Provider:  Pecola LawlessHopper, William F, MD  Encounter Date: 10/31/2014      PT End of Session - 10/31/14 1618    Visit Number 2  G2   Number of Visits 8   Date for PT Re-Evaluation 12/27/14   Authorization Type G code every 10th visit   PT Start Time 0800   PT Stop Time 0845   PT Time Calculation (min) 45 min      Past Medical History  Diagnosis Date  . Hyperlipidemia   . Hypertension   . Diverticulosis   . Gilbert's syndrome   . Osteopenia 2010     T score -1.9 @ hip (femoral neck)  . CAD S/P percutaneous coronary angioplasty 2003    Dr Antoine PocheHochrein: 2003: a) s/p CABG (LIMA-LAD, SVG-Diag);b) 2008: prior PCI to native RCA; c) 12/12/2013: SVG-Diag occluded, patent LIMA, 95% post stent in RCA --> PCI  with 2.75 x 18 mm Xience Alpine DES, residual dRCA-PLA 80=-90%, EF 55-65%  . PUD (peptic ulcer disease)     PMH , ? 1995  . Anginal pain   . Heart murmur   . Shortness of breath   . Short-term memory loss   . GERD (gastroesophageal reflux disease)   . Arthritis     BACK & SHOULDERS    Past Surgical History  Procedure Laterality Date  . Hemorrhoid surgery    . Cholecystectomy  2004  . Cervical fusion  1997    Dr Jeral FruitBotero  . Coronary artery bypass graft  2003    Last catheterization was in August 2008 demonstrated a LIMA to th LAD which was patent, there was an atretic saphenous vein graft to the diagonal, the LAD had a 90% stenosis in the large calcified segment, the diagonal has ostial 70% stenosis, the circumflex had a ramus intermediate with ostial 25% stenosis, the right coronary artery was dominant.  There was an ulcerated 80%-90% stenosis.    . Tonsillectomy and adenoidectomy    . Coronary stent placement   2008    drug-eluting stent to right coronary artery  . Upper gastrointestinal endoscopy       Dr Victorino DikeSam [place]  . Colonoscopy      Dr Victorino DikeSam [place]  . Coronary stent placement Right 12/12/2013    RT CORONARY  DES       DR COOPER   . Left heart catheterization with coronary/graft angiogram N/A 12/12/2013    Procedure: LEFT HEART CATHETERIZATION WITH Isabel CapriceORONARY/GRAFT ANGIOGRAM;  Surgeon: Micheline ChapmanMichael D Cooper, MD;  [facility];  Service: Cardiovascular;  Laterality: N/A;    There were no vitals filed for this visit.  Visit Diagnosis:  BPPV (benign paroxysmal positional vertigo), right      Subjective Assessment - 10/31/14 1611    Subjective Pt. reports he has not had vertigo since receiving treatment (Epley's) on Friday - states he is really doing much better; does report that "I feel something but it's not really vertigo"   Pertinent History pt. reports episode of vertigo in May 2015; went to ED in [facility]; occurred again in August 2015 - Urgent Care - diagnosed with vertigo   Patient Stated Goals resolve the vertigo   Currently in Pain? No/denies  Vestibular Assessment - 10/31/14 0001    Positional Testing   Dix-Hallpike Dix-Hallpike Right;Dix-Hallpike Left   Sidelying Test Sidelying Right;Sidelying Left   Sidelying Right   Sidelying Right Symptoms No nystagmus   Sidelying Left   Sidelying Left Duration no nystagmus   Positional Sensitivities   Sit to Supine No dizziness   Right Hallpike No dizziness   Up from Right Hallpike Lightheadedness   Up from Left Hallpike No dizziness   Rolling Right No dizziness   Rolling Left No dizziness     Epley's maneuver performed for R BPPV due to c/o vertigo in R sidelying position on initial test and also due to some C/o mild dizziness with return to upright from R Dix-Hallpike test  TherEx: pt. Performed cervical rotation, lateral flexion, and flexion/extension x 10 reps each due to c/o cervical musc.  Tightness  Self care; discussed etiology of BPPV and when Brandt-Daroff exercises are necessary and when they are not needed  Pt. Reported no vertigo at end of session today - appears to be resolved                    PT Education - 10/31/14 1622    Education provided Yes   Education Details added cervical flexion/extension, rotation, and lateral flexion 10 reps each 1-2 x/day   Person(s) Educated Patient   Methods Explanation;Demonstration   Comprehension Verbalized understanding;Returned demonstration          PT Short Term Goals - 04/25/14 1534    PT SHORT TERM GOAL #1   Title same as LTG's           PT Long Term Goals - 10/27/14 1644    PT LONG TERM GOAL #1   Title Pt. will have a negative right Dix-hallpike test without nystagmus and no c/o vertigo to indicate that R BPPV has resolved   Time 4   Period Weeks   PT LONG TERM GOAL #2   Title Report no vertigo with bed mobility or ambulation   Time 4   Period Weeks   PT LONG TERM GOAL #3   Title Independent in Brandt-Daroff exercises for habituation of R BPPV   Time 4   Period Weeks               Plan - 10/31/14 1619    Clinical Impression Statement BPPV appears to be resolved as no positions nor tests provoke vertigo; cervical tightness may contribute to some feelings of "dizziness or something" but pt. reported this feeling only lasted 1-2 secs with return to upright from R Dix-Hallpike test and in right sidelying positoin initially - only occurred once in right sidelying   Pt will benefit from skilled therapeutic intervention in order to improve on the following deficits Other (comment);Abnormal gait;Decreased balance  vertigo   Rehab Potential Good   PT Frequency 1x / week   PT Duration 8 weeks   PT Treatment/Interventions Other (comment);Patient/family education;Neuromuscular re-education;Balance training;Therapeutic exercise   PT Next Visit Plan recheck for R BPPV   PT Home Exercise  Plan Continue brandt-daroff HEP; added cervical AROM for stretching   Consulted and Agree with Plan of Care Patient        Problem List Patient Active Problem List   Diagnosis Date Noted  . Right lumbar radiculopathy 10/19/2014  . CSA (central sleep apnea) 05/25/2014  . BPV (benign positional vertigo) 04/18/2014  . Aortic regurgitation 01/09/2014  . Angina, class III 12/12/2013  . Angina pectoris, crescendo 12/09/2013  .  DOE (dyspnea on exertion) 10/15/2013  . Other malaise and fatigue 10/15/2013  . GERD 09/21/2009  . 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 ARTHRITIS, GENERALIZED 01/09/2009  . OSTEOPENIA 01/09/2009  . CAD S/P percutaneous coronary angioplasty - PCI of RCA (12/12/2013) 11/16/2008  . DIVERTICULOSIS, COLON 11/16/2008  . HYPERLIPIDEMIA 05/04/2007  . Essential hypertension 05/04/2007    Kary Kosilday, Adryan Druckenmiller Suzanne, PT 10/31/2014, 4:29 PM  [place] [facility] [address] [place], [place], [address] Phone: [phone]   Fax:  [phone]

## 2014-11-03 ENCOUNTER — Ambulatory Visit: Payer: Medicare Other | Admitting: Rehabilitative and Restorative Service Providers"

## 2014-11-10 ENCOUNTER — Encounter: Payer: Medicare Other | Admitting: Rehabilitative and Restorative Service Providers"

## 2014-11-10 NOTE — Therapy (Signed)
[place] [address] [place], [place], [address] Phone: [phone]   Fax:  [phone]  Patient Details  Name: [name] MRN: [id number] Date of Birth: 08/06/1934 Referring Provider:  No ref. provider found  Encounter Date: 11/10/2014  PHYSICAL THERAPY DISCHARGE SUMMARY  Visits from Start of Care: 2  Current functional level related to goals / functional outcomes:     PT Long Term Goals - 10/27/14 1644    PT LONG TERM GOAL #1   Title Pt. will have a negative right Dix-hallpike test without nystagmus and no c/o vertigo to indicate that R BPPV has resolved   Time 4   Period Weeks   PT LONG TERM GOAL #2   Title Report no vertigo with bed mobility or ambulation   Time 4   Period Weeks   PT LONG TERM GOAL #3   Title Independent in Brandt-Daroff exercises for habituation of R BPPV   Time 4   Period Weeks     *Goals not reassessed as the patient cancelled remaining visit per telephone call to report symptoms improved and no further treatment needed at this time.    Remaining deficits: None noted per patient phone message, see last treatment note for patient status.   Education / Equipment: HEP.  Plan: Patient agrees to discharge.  Patient goals were partially met. Patient is being discharged due to meeting the stated rehab goals.  * Goal status unknown due to patient not returning. Thank you for the referral of this patient. ?????       Mardy Lucier, PT 11/10/2014, 9:06 AM  [facility] [place] [address] [place], [place], [address] Phone: [phone]   Fax:  [phone]

## 2014-11-15 ENCOUNTER — Encounter: Payer: Medicare Other | Admitting: Rehabilitative and Restorative Service Providers"

## 2014-11-21 ENCOUNTER — Encounter: Payer: Self-pay | Admitting: Neurology

## 2014-11-21 ENCOUNTER — Ambulatory Visit (INDEPENDENT_AMBULATORY_CARE_PROVIDER_SITE_OTHER): Payer: Medicare Other | Admitting: Neurology

## 2014-11-21 VITALS — BP 126/64 | HR 78 | Resp 20 | Ht 64.57 in | Wt 154.0 lb

## 2014-11-21 DIAGNOSIS — G478 Other sleep disorders: Secondary | ICD-10-CM | POA: Diagnosis not present

## 2014-11-21 DIAGNOSIS — G4731 Primary central sleep apnea: Secondary | ICD-10-CM

## 2014-11-21 DIAGNOSIS — G4752 REM sleep behavior disorder: Secondary | ICD-10-CM

## 2014-11-21 MED ORDER — CLONAZEPAM 0.5 MG PO TABS: ORAL_TABLET | ORAL | Status: DC
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
## 2014-11-23 ENCOUNTER — Telehealth: Payer: Self-pay | Admitting: Neurology

## 2014-11-23 NOTE — Telephone Encounter (Signed)
I called back.  Spoke with Delice Bison.  Rx verified.

## 2014-11-23 NOTE — Telephone Encounter (Signed)
Feliz Beam with [employer] called regarding clonazePAM (KLONOPIN) 0.5 MG tablet stating the directions are very vague. Please call and advise. He can be reached at [phone].

## 2015-01-01 ENCOUNTER — Other Ambulatory Visit: Payer: Self-pay | Admitting: Cardiology

## 2015-01-01 NOTE — Telephone Encounter (Signed)
Rx(s) sent to pharmacy electronically.  

## 2015-01-16 ENCOUNTER — Other Ambulatory Visit: Payer: Self-pay

## 2015-01-16 MED ORDER — CLONAZEPAM 0.5 MG PO TABS: 0.5000 mg | ORAL_TABLET | Freq: Every day | ORAL | Status: DC

## 2015-01-16 NOTE — Telephone Encounter (Signed)
Rx signed and faxed.

## 2015-03-16 ENCOUNTER — Encounter: Payer: Self-pay | Admitting: Cardiology

## 2015-03-16 ENCOUNTER — Ambulatory Visit (INDEPENDENT_AMBULATORY_CARE_PROVIDER_SITE_OTHER): Payer: Medicare Other | Admitting: Cardiology

## 2015-03-16 VITALS — BP 120/68 | HR 73 | Ht 64.0 in | Wt 155.4 lb

## 2015-03-16 DIAGNOSIS — I351 Nonrheumatic aortic (valve) insufficiency: Secondary | ICD-10-CM | POA: Diagnosis not present

## 2015-03-16 DIAGNOSIS — I2510 Atherosclerotic heart disease of native coronary artery without angina pectoris: Secondary | ICD-10-CM | POA: Diagnosis not present

## 2015-03-16 DIAGNOSIS — E782 Mixed hyperlipidemia: Secondary | ICD-10-CM

## 2015-03-16 DIAGNOSIS — I10 Essential (primary) hypertension: Secondary | ICD-10-CM

## 2015-03-16 DIAGNOSIS — Z9861 Coronary angioplasty status: Secondary | ICD-10-CM

## 2015-03-16 NOTE — Patient Instructions (Signed)
You may stop Plavix now.  Continue your other therapy  I will see you in 6-8 months  Try and walk more.

## 2015-03-16 NOTE — Progress Notes (Signed)
[name] Date of Birth: [birth year] Medical Record #[id number]  History of Present Illness: [name] is seen for follow up CAD. He is s/p CABG in 2003 by Dr. Cornelius Moras with LIMA to the LAD and SVG to the Diagonal. By cath in 2008 he had a high grade proximal RCA stenosis and had stenting with a 2.75 mm Promus stent by Dr. Juanda Chance. He was noted to have an atretic SVG to the diagonal but it was felt that antegrade flow into the diagonal was OK. His last stress Myoview in November 2012 showed a fixed apical defect without ischemia and EF 62%.  In May 2015 he presented with unstable angina. Cardiac cath showed patent LIMA to the LAD and occlusion of SVG to the diagonal. There was a severe stenosis in the RCA which was stented with a DES. He also had an Echo in 9/15 for a murmur and has AV sclerosis with mild to moderate AI.   On follow up today he has a number of complaints. He gets tired with walking. He is limited by bilateral pain in back and hips. Some SOB with exertion. He has had vertigo 2-3 times but this has improved. Occasional anterior chest pain both sides that goes away quickly. Thinks it is gas. When he has to reach overhead his arms tire easily. None of these symptoms are like his angina.     Medication List       This list is accurate as of: 03/16/15  1:06 PM.  Always use your most recent med list.               aspirin EC 81 MG tablet  Take 81 mg by mouth daily.     clonazePAM 0.5 MG tablet  Commonly known as:  KLONOPIN  Take 1 tablet (0.5 mg total) by mouth at bedtime. Prn  For REM BD and can be used for Vertigo.     fluticasone 27.5 MCG/SPRAY nasal spray  Commonly known as:  VERAMYST  Place 1 spray into both nostrils daily. AS NEEDED     loratadine 10 MG tablet  Commonly known as:  CLARITIN  Take 10 mg by mouth daily.     nitroGLYCERIN 0.4 MG SL tablet  Commonly known as:  NITROSTAT  Place 0.4 mg under the tongue every 5 (five) minutes as needed for chest  pain.     rosuvastatin 20 MG tablet  Commonly known as:  CRESTOR  Take 1 tablet (20 mg total) by mouth at bedtime.         Allergies  Allergen Reactions  . Codeine Nausea Only    Past Medical History  Diagnosis Date  . Hyperlipidemia   . Hypertension   . Diverticulosis   . Gilbert's syndrome   . Osteopenia 2010     T score -1.9 @ hip (femoral neck)  . CAD S/P percutaneous coronary angioplasty 2003    Dr Antoine Poche: 2003: a) s/p CABG (LIMA-LAD, SVG-Diag);b) 2008: prior PCI to native RCA; c) 12/12/2013: SVG-Diag occluded, patent LIMA, 95% post stent in RCA --> PCI  with 2.75 x 18 mm Xience Alpine DES, residual dRCA-PLA 80=-90%, EF 55-65%  . PUD (peptic ulcer disease)     PMH , ? 1995  . Anginal pain   . Heart murmur   . Shortness of breath   . Short-term memory loss   . GERD (gastroesophageal reflux disease)   . Arthritis     BACK & SHOULDERS    Past Surgical  History  Procedure Laterality Date  . Hemorrhoid surgery    . Cholecystectomy  2004  . Cervical fusion  1997    Dr Jeral Fruit  . Coronary artery bypass graft  2003    Last catheterization was in August 2008 demonstrated a LIMA to th LAD which was patent, there was an atretic saphenous vein graft to the diagonal, the LAD had a 90% stenosis in the large calcified segment, the diagonal has ostial 70% stenosis, the circumflex had a ramus intermediate with ostial 25% stenosis, the right coronary artery was dominant.  There was an ulcerated 80%-90% stenosis.    . Tonsillectomy and adenoidectomy    . Coronary stent placement  2008    drug-eluting stent to right coronary artery  . Upper gastrointestinal endoscopy       Dr Victorino Dike  . Colonoscopy      Dr Victorino Dike  . Coronary stent placement Right 12/12/2013    RT CORONARY  DES       DR COOPER   . Left heart catheterization with coronary/graft angiogram N/A 12/12/2013    Procedure: LEFT HEART CATHETERIZATION WITH Isabel Caprice;  Surgeon: Micheline Chapman, MD;   Location: [facility] CATH LAB;  Service: Cardiovascular;  Laterality: N/A;    Social History   Social History  . Marital Status: Married    Spouse Name: [name]  . Number of Children: 2  . Years of Education: Bachelors   Occupational History  .     Social History Main Topics  . Smoking status: Former Smoker    Quit date: 02/25/1979  . Smokeless tobacco: Never Used     Comment: smoked 1955-1980, up to 1 ppd  . Alcohol Use: Yes     Comment: 1/2 cup / day of wine  . Drug Use: No  . Sexual Activity: Not Asked   Other Topics Concern  . 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   Patient has two adult children.   Patient is retired.   Patient has a Bachelor's degree.   Patient is right-handed.   Patient drinks 1/2 liter of green tea daily.   Walks once week for exercise    Family History  Problem Relation Age of Onset  . Cancer Father     ? primary  . Uterine cancer Daughter   . Hypertension Maternal Grandmother   . Osteoporosis Sister   . Diabetes Sister   . Stroke Neg Hx   . Peripheral vascular disease Maternal Grandmother   . Heart attack Neg Hx     Review of Systems: As noted in HPI.  All other systems were reviewed and are negative.  Physical Exam: BP 120/68 mmHg  Pulse 73  Ht  (1.626 m)  Wt 70.489 kg (155 lb 6.4 oz)  BMI 26.66 kg/m2 Filed Weights   03/16/15 1111  Weight: 70.489 kg (155 lb 6.4 oz)  GENERAL:  Well appearing WM in NAD. HEENT:  PERRL, EOMI, sclera are clear. Oropharynx is clear. NECK:  No jugular venous distention, carotid upstroke brisk and symmetric, no bruits, no thyromegaly or adenopathy LUNGS:  Clear to auscultation bilaterally CHEST:  Unremarkable HEART:  RRR,  PMI not displaced or sustained,S1 and S2 within normal limits, Grade 2/6 harsh systolic murmur at the apex with soft diastolic murmur at the LSB.  ABD:  Soft, nontender. BS +, no masses or bruits. No hepatomegaly, no  splenomegaly EXT:  2 +  pulses throughout, no edema, no cyanosis no clubbing SKIN:  Warm and dry.  No rashes NEURO:  Alert and oriented x 3. Cranial nerves II through XII intact. PSYCH:  Cognitively intact    LABORATORY DATA:  Lab Results  Component Value Date   WBC 6.0 10/24/2014   HGB 15.7 10/24/2014   HCT 44.6 10/24/2014   PLT 240.0 10/24/2014   GLUCOSE 101* 10/24/2014   CHOL 147 10/24/2014   TRIG 94.0 10/24/2014   HDL 47.30 10/24/2014   LDLCALC 81 10/24/2014   ALT 15 10/24/2014   AST 16 10/24/2014   NA 137 10/24/2014   K 4.3 10/24/2014   CL 103 10/24/2014   CREATININE 0.84 10/24/2014   BUN 16 10/24/2014   CO2 27 10/24/2014   TSH 1.69 10/24/2014   PSA 0.98 06/04/2006   INR 0.98 01/15/2014   HGBA1C 5.6 10/26/2014   Ecg today shows NSR with normal Ecg. I have personally reviewed and interpreted this study.   Assessment / Plan: 1. CAD. Known CAD s/p CABG in 2003 and subsequent stenting of native RCA 2008. DES of the proximal RCA in June 2015. Recommend stopping Plavix at this point since he is more than 1 year out from stent. Continue ASA.   2. Mild to moderate AI.  Normal LV size and function. Asymptomatic. Will follow every 4-5 years unless status changes.   3. Hyperlipidemia- on statin. Well controlled.   4. Fatigue. No improvement with stopping metoprolol. Etiology is unclear. Recommend he increase his aerobic activity to try and improve conditioning.   5. Back/hip pain. Doubt this is a circulation issue. Pulses are good bilaterally. Review of pelvic CT last year showed no obstruction in aortoiliac vessels.

## 2015-03-20 ENCOUNTER — Ambulatory Visit: Payer: Medicare Other | Admitting: Cardiology

## 2015-04-09 ENCOUNTER — Telehealth: Payer: Self-pay | Admitting: Cardiology

## 2015-04-09 NOTE — Telephone Encounter (Signed)
Description sounds like true musculoskeletal pain rather than statin myopathy, would discuss with PCP. On the other hand, temporarily stopping the statin (for 30 days) to see if that helps is not unreasonable. He can do that if his PCP does not identify an alternative explanation. He should not stay off statin long term.

## 2015-04-09 NOTE — Telephone Encounter (Signed)
Returned call to patient. He reports pain in his right leg for the past 3 days. He reports the pain used to be in the lower and upper back part of his leg but the upper part of his leg pain has gone away - so he just has pain in lower leg. He reports the pain in worse in the AM when he wakes up and gets better throughout the day - it is OK by the afternoon. He reports some pain with walking (which is different from what the incoming call documented). He reports some years ago an MD asked him if he had muscle pain from his cholesterol medication - he has been on this a while and his symptoms are unilateral. He denies swelling, leg is NOT tender to touch - he can massage it without issues. He has not had an orthopaedic injury or issue with this leg. He is scheduled to see his PCP tomorrow @ 1pm. His plavix was d/c'ed 03/16/15 by [name].   He requests a call back on his cell #  Message routed to DOD Dr Royann Shiversroitoru to review and advise

## 2015-04-09 NOTE — Telephone Encounter (Signed)
Called patient back and informed him of MD advice. He will see PCP tomorrow and call us back with PCP assessment and updated on his leg situation. Informed him that MD said it may be possible to temporarily hold statin but should wait on PCP eval. He is aware he will need to call and ask for the triage nurse.

## 2015-04-09 NOTE — Telephone Encounter (Signed)
Pt called in stating that he has been having pain in his rt leg for the past 3 days. He says that the pain lessens when he walks around but while his seated that's when the pain starts up. Please advise him on what to do.   Thanks

## 2015-04-10 ENCOUNTER — Encounter: Payer: Self-pay | Admitting: Internal Medicine

## 2015-04-10 ENCOUNTER — Ambulatory Visit (INDEPENDENT_AMBULATORY_CARE_PROVIDER_SITE_OTHER): Payer: Medicare Other | Admitting: Internal Medicine

## 2015-04-10 ENCOUNTER — Other Ambulatory Visit: Payer: Medicare Other

## 2015-04-10 VITALS — BP 110/58 | HR 75 | Temp 97.9°F | Wt 156.0 lb

## 2015-04-10 DIAGNOSIS — M79661 Pain in right lower leg: Secondary | ICD-10-CM | POA: Diagnosis not present

## 2015-04-10 NOTE — Progress Notes (Signed)
   Subjective:    Patient ID: [name], male    DOB: 21-Apr-1935, 80 y.o.   MRN: [id number]  HPI He describes pain in the right superior lateral calf as well as the distal right upper thigh over the last 7-8 weeks. There was no specific trigger or injury. Initially the pain appeared after he got out of bed; it lasted 30 minutes. He has not treated it with any specific interventions but the pain in the distal thigh resolved within the last 48 hours.  The pain in the lateral superior calf is intermittent. It is better when he sits.  He has had some exertional dyspnea for 2 weeks.  Significantly he had a PCI of the right coronary artery in June 2015. He was continued on Plavix until September 30 of this year.  There is no personal family history of deep venous thrombosis of pulmonary thromboemboli. Review of Systems  There is no significant cough, sputum production,hemoptysis, wheezing,or  paroxysmal nocturnal dyspnea. Unexplained weight loss, abdominal pain, significant dyspepsia, dysphagia, melena, rectal bleeding, or persistently small caliber stools are not present. Dysuria, pyuria, hematuria, frequency, nocturia or polyuria are denied. He has had some ear pressure; he has used Q tips. He questions some lower extremity weakness.    Objective:   Physical Exam Pertinent or positive findings include: Arcus senilis is present. Complete dentures are noted. He has a grade 1.5 systolic murmur. Homans sign is negative. Pedal pulses are excellent. There are no ischemic changes peripherally. He has pain with flexion of the right knee and extension of the foot with lateral rotation of the lower leg. The pain is localized to the right fibular head.  General appearance :adequately nourished; in no distress.  Eyes: No conjunctival inflammation or scleral icterus is present.  Ears: some wax on R. TMs normal.  Oral exam:  Lips and gums are healthy appearing.There is no oropharyngeal erythema  or exudate noted.   Heart:  Normal rate and regular rhythm. S1 and S2 normal without gallop, click, rub or other extra sounds    Lungs:Chest clear to auscultation; no wheezes, rhonchi,rales ,or rubs present.No increased work of breathing.   Abdomen: bowel sounds normal, soft and non-tender without masses, organomegaly or hernias noted.  No guarding or rebound.   Vascular : all pulses equal ; no bruits present.  Skin:Warm & dry.  Intact without suspicious lesions or rashes ; no tenting or jaundice   Lymphatic: No lymphadenopathy is noted about the head, neck, axilla.   Neuro: Strength, tone & DTRs normal.     Assessment & Plan:  #1 calf pain; clinically deep venous thrombosis is not present despite the fact that he is now off Plavix  #2 fibular head pain; degenerative joint disease   Plan: see orders and recommendations

## 2015-04-10 NOTE — Patient Instructions (Signed)
Use an anti-inflammatory cream such as Aspercreme or Zostrix cream twice a day to the affected area as needed. In lieu of this warm moist compresses or  hot water bottle can be used. Do not apply ice .Consider glucosamine sulfate 1500 mg daily for joint symptoms. Take this daily  for 6-8 weeks and then leave it off. This will rehydrate the cartilages.

## 2015-04-11 LAB — D-DIMER, QUANTITATIVE: D-Dimer, Quant: 0.39 ug/mL-FEU (ref 0.00–0.48)

## 2015-05-30 ENCOUNTER — Encounter: Payer: Self-pay | Admitting: Neurology

## 2015-05-30 ENCOUNTER — Ambulatory Visit (INDEPENDENT_AMBULATORY_CARE_PROVIDER_SITE_OTHER): Payer: Medicare Other | Admitting: Adult Health

## 2015-05-30 VITALS — BP 132/100 | HR 74 | Resp 20 | Ht 65.0 in | Wt 156.0 lb

## 2015-05-30 DIAGNOSIS — G4731 Primary central sleep apnea: Secondary | ICD-10-CM

## 2015-05-30 DIAGNOSIS — G478 Other sleep disorders: Secondary | ICD-10-CM | POA: Diagnosis not present

## 2015-05-30 DIAGNOSIS — G4752 REM sleep behavior disorder: Secondary | ICD-10-CM

## 2015-05-30 NOTE — Progress Notes (Signed)
I agree with the assessment and plan as directed by NP .The patient is known to me .   Paige Monarrez, MD  

## 2015-05-30 NOTE — Patient Instructions (Signed)
Try tighten straps for CPAP  Continue using CPAP nightly Continue Klonopin If your symptoms worsen or you develop new symptoms please let us know.
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
## 2015-07-05 ENCOUNTER — Telehealth: Payer: Self-pay | Admitting: Neurology

## 2015-07-05 NOTE — Telephone Encounter (Signed)
Patient called requesting a recommendation by Dr. Vickey Huger for a family Doctor for he and his wife.

## 2015-07-09 ENCOUNTER — Telehealth: Payer: Self-pay | Admitting: Cardiology

## 2015-07-09 MED ORDER — NITROGLYCERIN 0.4 MG SL SUBL: 0.4000 mg | SUBLINGUAL_TABLET | SUBLINGUAL | Status: DC | PRN

## 2015-07-09 MED ORDER — ROSUVASTATIN CALCIUM 20 MG PO TABS: 20.0000 mg | ORAL_TABLET | Freq: Every day | ORAL | Status: DC

## 2015-07-09 NOTE — Telephone Encounter (Signed)
Spoke to Dr. Vickey Huger. She recommended Dr. Jacinta Shoe. I spoke to pt and gave him Dr. Loren Racer name and number. Pt verbalized understanding and appreciation.

## 2015-07-09 NOTE — Telephone Encounter (Signed)
Spoke to pt. He said that Dr. Vickey Huger mentioned a [place] primary care doctor but they can't remember that doctor's name. Will ask Dr. Vickey Huger if she knows who this is and call the pt back.

## 2015-07-09 NOTE — Telephone Encounter (Signed)
Refill sent to the pharmacy electronically.  

## 2015-07-09 NOTE — Telephone Encounter (Signed)
°*  STAT* If patient is at the pharmacy, call can be transferred to refill team.   1. Which medications need to be refilled? (please list name of each medication and dose if known) Crestor (Generic) and NItro   2. Which pharmacy/location (including street and city if local pharmacy) is medication to be sent to?[facility]  3. Do they need a 30 day or 90 day supply?90/30  Needs a new prescription for the Generic form of Crestor sent to [facility]

## 2015-07-10 ENCOUNTER — Telehealth: Payer: Self-pay | Admitting: Internal Medicine

## 2015-07-10 NOTE — Telephone Encounter (Signed)
Pt called request for him and his spouse, Mrs.Thoen, [name] ([birth year]01[birth year]) to transfer from Dr. Alwyn Ren to Dr. Macario Golds. Advise pt that Dr. Macario Golds is not taking new pt at the moment, please advise.   Phone # [phone]

## 2015-07-10 NOTE — Telephone Encounter (Signed)
Unfortunately, I'm not able to accept any more new patients at this time.  I'm sorry! Thank you!  

## 2015-07-10 NOTE — Telephone Encounter (Signed)
Pt aware.

## 2015-07-11 ENCOUNTER — Other Ambulatory Visit: Payer: Self-pay | Admitting: *Deleted

## 2015-07-11 MED ORDER — NITROGLYCERIN 0.4 MG SL SUBL: 0.4000 mg | SUBLINGUAL_TABLET | SUBLINGUAL | Status: DC | PRN

## 2015-07-12 ENCOUNTER — Telehealth: Payer: Self-pay | Admitting: Internal Medicine

## 2015-07-12 NOTE — Telephone Encounter (Signed)
Unfortunately, I'm not able to accept any more new patients at this time.  I'm sorry! Thank you!  

## 2015-07-12 NOTE — Telephone Encounter (Signed)
Pt informed

## 2015-07-12 NOTE — Telephone Encounter (Signed)
Pt stopped by asking if him and his wife Elane Fritz ([id number]) can transfer to you from [facility] Please advise

## 2015-07-17 ENCOUNTER — Telehealth: Payer: Self-pay | Admitting: *Deleted

## 2015-07-17 ENCOUNTER — Other Ambulatory Visit: Payer: Self-pay | Admitting: *Deleted

## 2015-07-17 MED ORDER — NITROGLYCERIN 0.4 MG SL SUBL: 0.4000 mg | SUBLINGUAL_TABLET | SUBLINGUAL | Status: DC | PRN

## 2015-07-17 NOTE — Telephone Encounter (Signed)
ERROR

## 2015-07-24 ENCOUNTER — Other Ambulatory Visit: Payer: Self-pay

## 2015-07-24 MED ORDER — CLONAZEPAM 0.5 MG PO TABS: 0.2500 mg | ORAL_TABLET | Freq: Every day | ORAL | Status: DC

## 2015-07-25 ENCOUNTER — Telehealth: Payer: Self-pay | Admitting: Neurology

## 2015-07-25 NOTE — Telephone Encounter (Signed)
Pt called inquiring regarding refill for clonazePAM (KLONOPIN) 0.5 MG tablet . He wants it sent to [facility]

## 2015-07-25 NOTE — Telephone Encounter (Signed)
I spoke to pt and advised him that I faxed his klonopin RX to [facility] yesterday. Pt verbalized understanding.

## 2015-07-31 IMAGING — CT CT HEAD W/O CM
1 series · 16 of 30 positions shown, 20 images · non-contrast
Comparison: Head CT [DATE]

CLINICAL DATA: Dizziness and lightheadedness.

EXAM:
CT HEAD WITHOUT CONTRAST
TECHNIQUE: Contiguous axial images were obtained from the base of the skull
through the vertex without contrast.

[Series 2: head 4.8 h37s · axial · 0.44mm/px · z∈[-138,-2]mm · 16 of 32 slices shown, 20 images]
[im 2/32  brain]
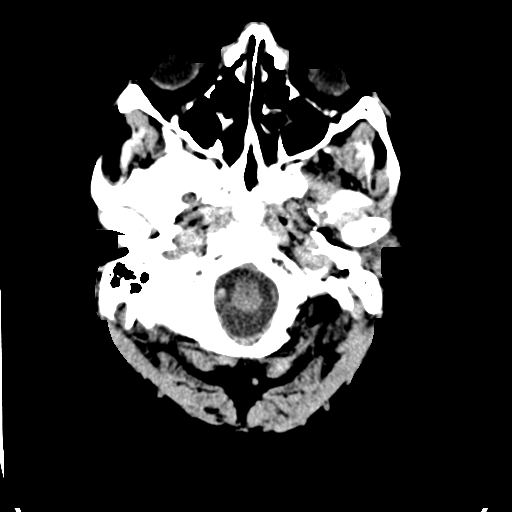
[im 2/32  bone]
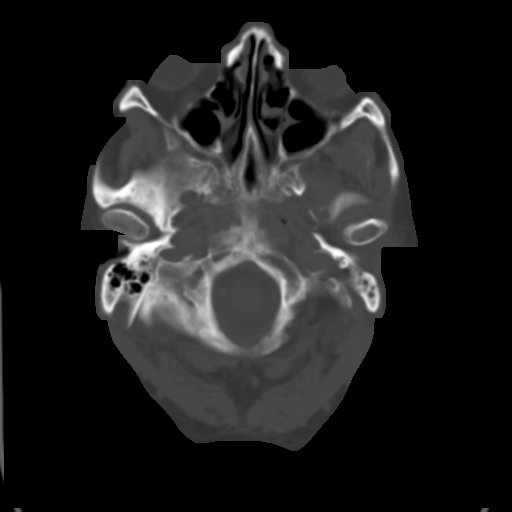
[im 4/32  brain]
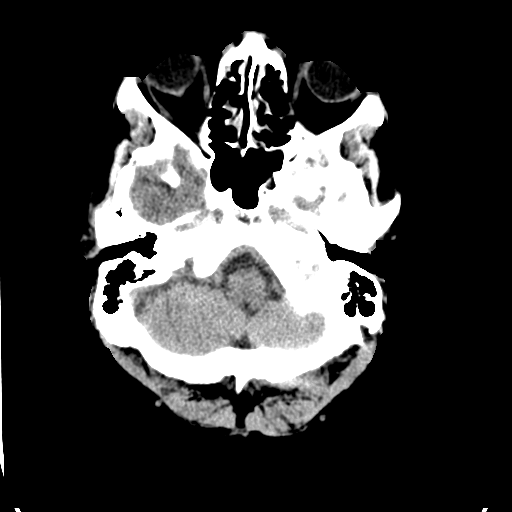
[im 6/32  brain]
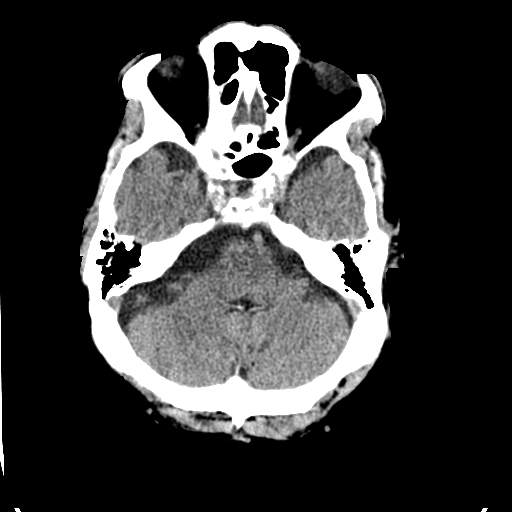
[im 8/32  brain]
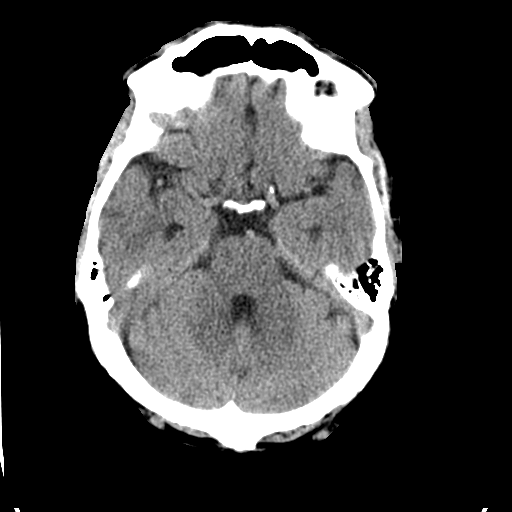
[im 9/32  brain]
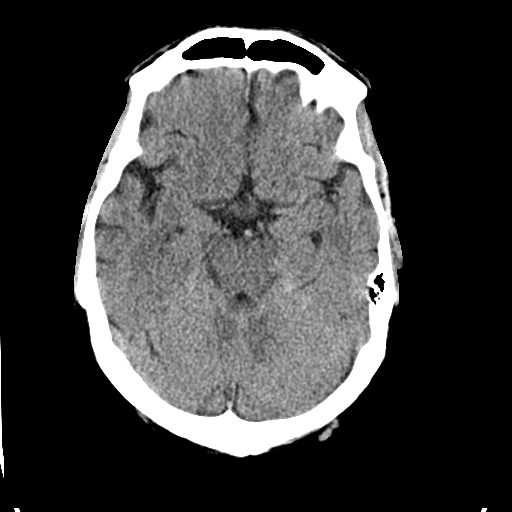
[im 9/32  bone]
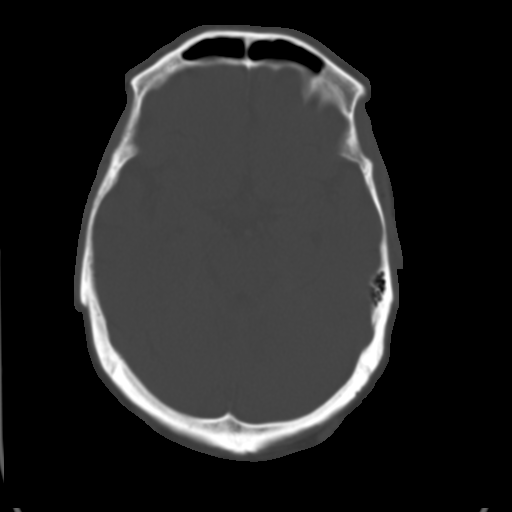
[im 11/32  brain]
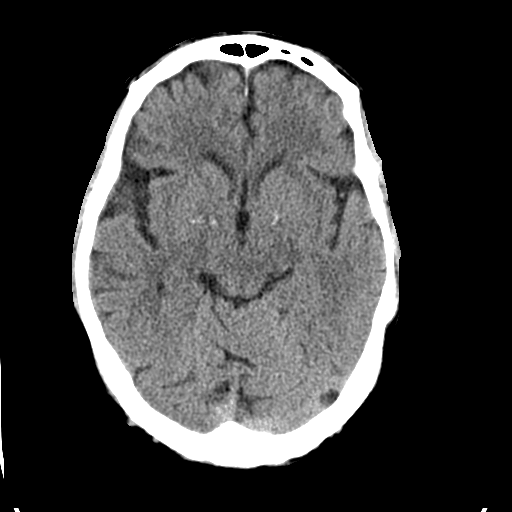
[im 13/32  brain]
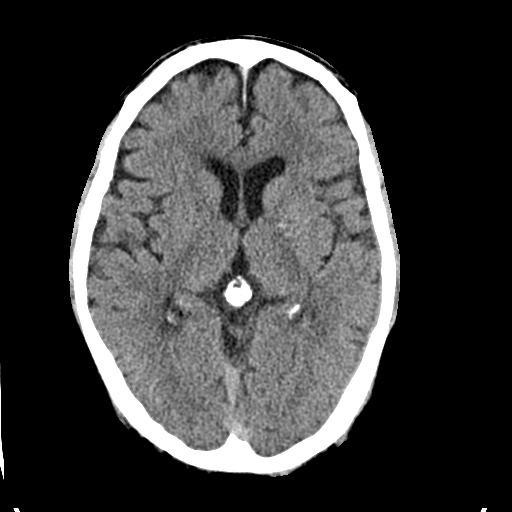
[im 15/32  brain]
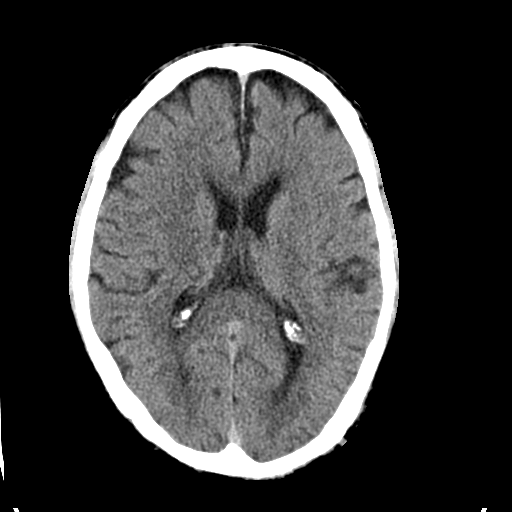
[im 17/32  brain]
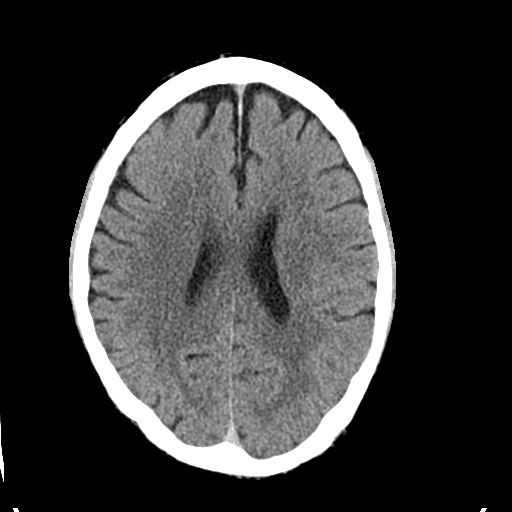
[im 17/32  bone]
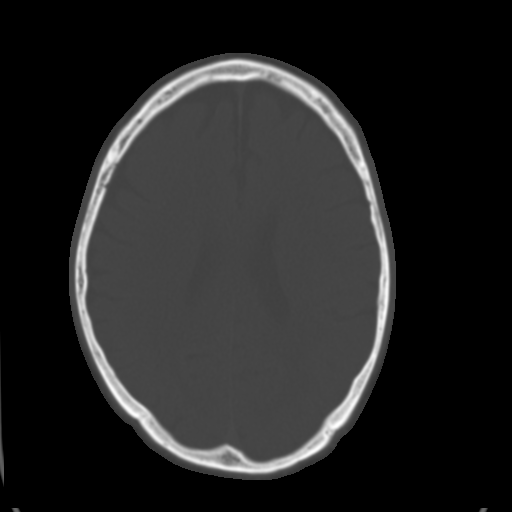
[im 19/32  brain]
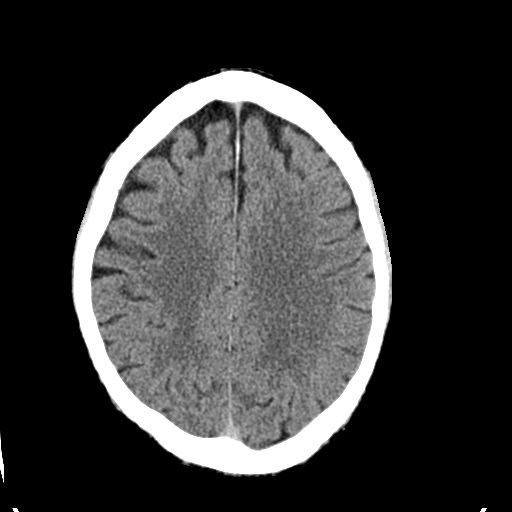
[im 21/32  brain]
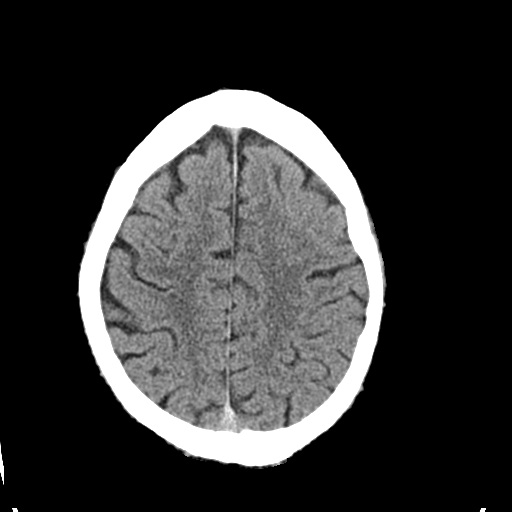
[im 23/32  brain]
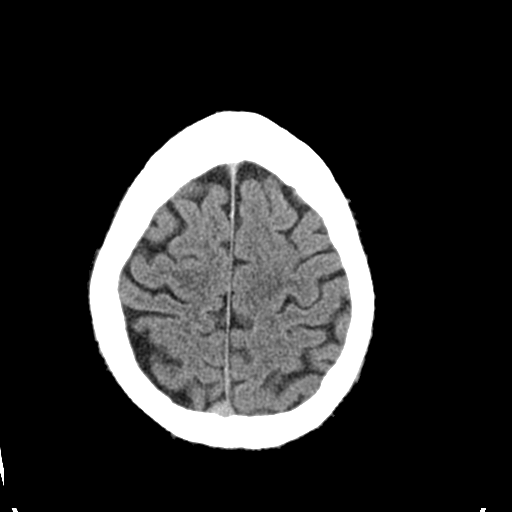
[im 24/32  brain]
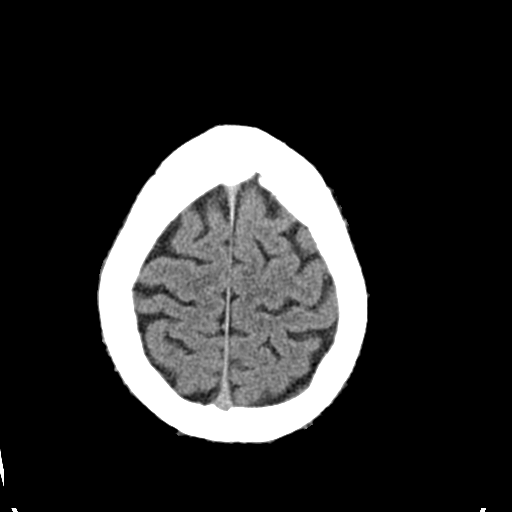
[im 24/32  bone]
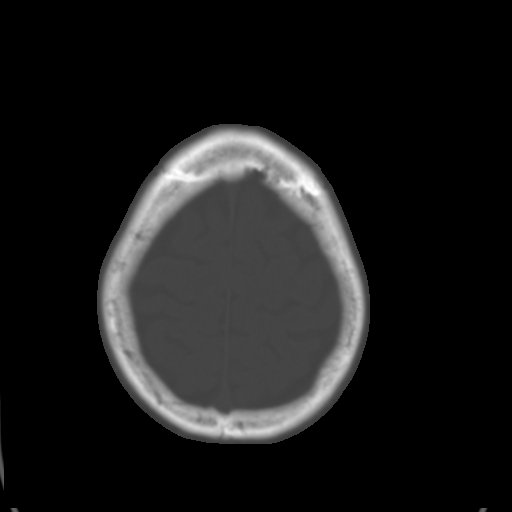
[im 26/32  brain]
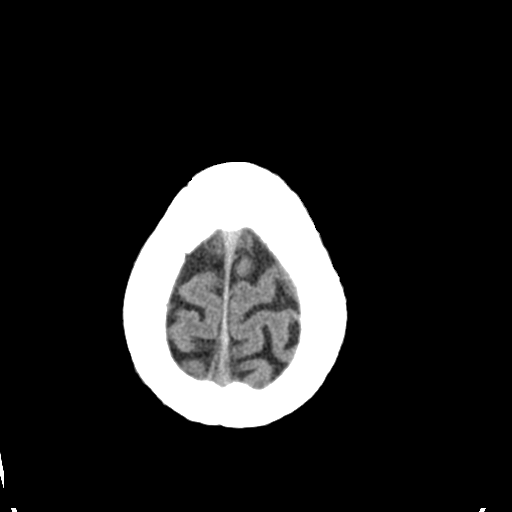
[im 28/32  brain]
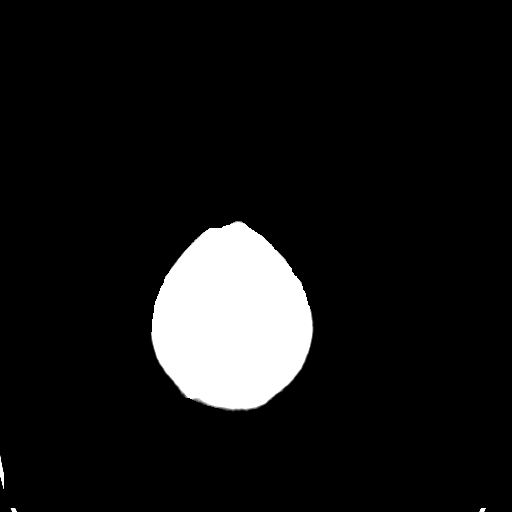
[im 30/32  brain]
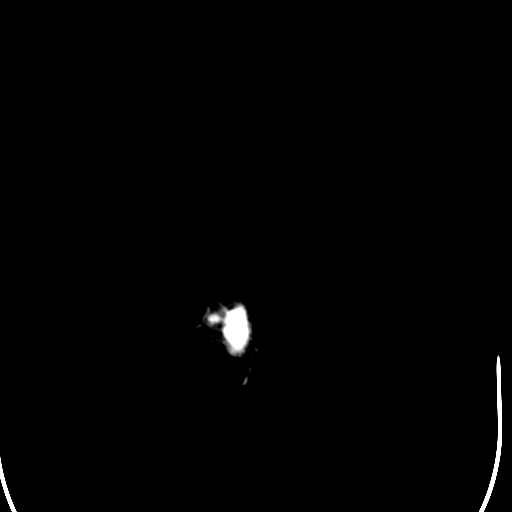

[16 of 30 positions shown; findings below may reference images not displayed]

FINDINGS: No evidence for an acute hemorrhage, mass lesion, midline shift,
hydrocephalus or large infarct. Calcifications in the basal ganglia.
Subtle low-density in the subcortical white matter suggests chronic
changes. Mucosal thickening in the ethmoid air cells is chronic.
Mild mucosal disease in the left maxillary sinus. No acute bone
abnormality.
IMPRESSION: No acute intracranial abnormality.

Mild paranasal sinus disease.

## 2015-08-28 ENCOUNTER — Telehealth: Payer: Self-pay

## 2015-08-28 NOTE — Telephone Encounter (Signed)
Pt and his spouse are requesting to transfer to you. Is this okay?

## 2015-08-31 ENCOUNTER — Telehealth: Payer: Self-pay | Admitting: Internal Medicine

## 2015-08-31 ENCOUNTER — Ambulatory Visit (INDEPENDENT_AMBULATORY_CARE_PROVIDER_SITE_OTHER): Payer: Medicare Other

## 2015-08-31 DIAGNOSIS — Z23 Encounter for immunization: Secondary | ICD-10-CM | POA: Diagnosis not present

## 2015-08-31 NOTE — Telephone Encounter (Signed)
Patient and his wife blanca were both patients of [facility]. They are requesting to move under your care. Is this something that you are ok with?

## 2015-09-03 NOTE — Telephone Encounter (Signed)
Unfortunately, I'm not able to accept any more new patients at this time.  I'm sorry! Thank you!  

## 2015-09-04 NOTE — Telephone Encounter (Signed)
Called and advised.

## 2015-09-05 ENCOUNTER — Telehealth: Payer: Self-pay | Admitting: Internal Medicine

## 2015-09-05 NOTE — Telephone Encounter (Signed)
Patient was referred by Dr. Alwyn RenHopper to establish with Dr. Drue NovelPaz. Dr. Drue NovelPaz next available is not until July 3rd. Please advise

## 2015-09-05 NOTE — Telephone Encounter (Signed)
Schedule a visit at pt's convenience, ok to put two 15-min slots together so he can be seen before July, if he has something urgent and I can't see him, maybe can be seen acutely by other provider

## 2015-09-05 NOTE — Telephone Encounter (Signed)
Patient extremely grateful and scheduled appointment for 10/23/2015 at 10:30am. Patient is not having any acute issues and would like CPE/Medicare Wellness done at the same time advised patient it would be at Dr. Drue NovelPaz discretion. Patient last annual / medicare wellness was conducted with Dr. Alwyn RenHopper 10/19/14.

## 2015-09-14 ENCOUNTER — Telehealth: Payer: Self-pay | Admitting: Cardiology

## 2015-09-14 NOTE — Telephone Encounter (Signed)
Returned call to patient.He stated he wanted to ask Dr.Jordan if ok to participate in the 12 month [facility].Stated the program is a combination drug omega 3 fish oil and losartan.Message sent to Dr.Jordan for advice.

## 2015-09-14 NOTE — Telephone Encounter (Signed)
LEFT MESSAGE TO CALL BACK SPEAK WITH CHERYL, LPN OR Kayman Snuffer, RN

## 2015-09-14 NOTE — Telephone Encounter (Signed)
New message    Patient calling wants to discuss with nurse about joining a research study.

## 2015-09-14 NOTE — Telephone Encounter (Signed)
It is OK with me for him to be in this study  Jethro Radke MD, [facility]

## 2015-09-17 NOTE — Telephone Encounter (Signed)
Returned call to patient Dr.Jordan advised ok to be in Energize study.Patient stated he has been having numbness in left little finger and left ring finger.Advised to see PCP.Follow up appointment scheduled with Dr.Jordan 12/10/15 at 9:15 am.Advised to call sooner if needed.

## 2015-09-18 ENCOUNTER — Ambulatory Visit (INDEPENDENT_AMBULATORY_CARE_PROVIDER_SITE_OTHER): Payer: Medicare Other | Admitting: Internal Medicine

## 2015-09-18 ENCOUNTER — Encounter: Payer: Self-pay | Admitting: Internal Medicine

## 2015-09-18 VITALS — BP 118/70 | HR 72 | Temp 97.9°F | Ht 65.0 in | Wt 155.4 lb

## 2015-09-18 DIAGNOSIS — R202 Paresthesia of skin: Secondary | ICD-10-CM

## 2015-09-18 DIAGNOSIS — R200 Anesthesia of skin: Secondary | ICD-10-CM

## 2015-09-18 DIAGNOSIS — I10 Essential (primary) hypertension: Secondary | ICD-10-CM

## 2015-09-18 DIAGNOSIS — R5383 Other fatigue: Secondary | ICD-10-CM | POA: Diagnosis not present

## 2015-09-18 DIAGNOSIS — Z09 Encounter for follow-up examination after completed treatment for conditions other than malignant neoplasm: Secondary | ICD-10-CM | POA: Insufficient documentation

## 2015-09-18 NOTE — Patient Instructions (Signed)
GO TO THE LAB :      Get the blood work    We are referring you to neurology

## 2015-09-18 NOTE — Progress Notes (Signed)
Pre visit review using our clinic review tool, if applicable. No additional management support is needed unless otherwise documented below in the visit note. 

## 2015-09-18 NOTE — Assessment & Plan Note (Signed)
Paresthesias, left fingers. 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 I recommend to see neurology for possibly a NCS HTN: Well-controlled, check a BMP and CBC Fatigue: ongoing for more than a year, note from cardiology 02-2015 reviewed, he was already fatigue, d/c BB did not  help ,was recommended increase physical activity. Most likely fatigue will be multifactorial (including normal aging, mild depression, deconditioning); will check a B12, folic acid and sedimentation rate. Discussed role of counseling --> declined . Recommend gradual exercise and reassess on return to the office in May.

## 2015-09-18 NOTE — Progress Notes (Signed)
Subjective:    Patient ID: [name], male    DOB: September 22, 1934, 81 y.o.   MRN: [id number]  DOS:  09/18/2015 Type of visit - description : New patient, transferring from Dr. Alwyn RenHopper, acute visit Interval history: 2 months history of persistent numbness at the fourth and fifth fingers, left. Has mild neck pain, chronic, on and off. He also has elbow and shoulder pain on and off bilaterally.  His only concern is fatigue, this is going on for at least a year, is described as lack of energy and feeling tired. I asked if he is depressed or anxious, he states that he is not but he is really frustrated by his age 45("I'm not as agile  as before") and self perceived  memory issues.  Review of Systems  denies fever chills or weight loss No major problem with headaches No chest pain + Fatigue when he walks and mild DOE. No nausea vomiting or diarrhea.  Past Medical History  Diagnosis Date  . Hyperlipidemia   . Hypertension   . Diverticulosis   . Gilbert's syndrome   . Osteopenia 2010     T score -1.9 @ hip (femoral neck)  . CAD S/P percutaneous coronary angioplasty 2003    Dr Antoine PocheHochrein: 2003: a) s/p CABG (LIMA-LAD, SVG-Diag);b) 2008: prior PCI to native RCA; c) 12/12/2013: SVG-Diag occluded, patent LIMA, 95% post stent in RCA --> PCI  with 2.75 x 18 mm Xience Alpine DES, residual dRCA-PLA 80=-90%, EF 55-65%  . PUD (peptic ulcer disease)     PMH , ? 1995  . Anginal pain (HCC)   . Heart murmur   . Shortness of breath   . Short-term memory loss   . GERD (gastroesophageal reflux disease)   . Arthritis     BACK & SHOULDERS    Past Surgical History  Procedure Laterality Date  . Hemorrhoid surgery    . Cholecystectomy  2004  . Cervical fusion  1997    Dr Jeral FruitBotero  . Coronary artery bypass graft  2003    Last catheterization was in August 2008 demonstrated a LIMA to th LAD which was patent, there was an atretic saphenous vein graft to the diagonal, the LAD had a 90% stenosis in the large  calcified segment, the diagonal has ostial 70% stenosis, the circumflex had a ramus intermediate with ostial 25% stenosis, the right coronary artery was dominant.  There was an ulcerated 80%-90% stenosis.    . Tonsillectomy and adenoidectomy    . Coronary stent placement  2008    drug-eluting stent to right coronary artery  . Upper gastrointestinal endoscopy       Dr Victorino DikeSam [place]  . Colonoscopy      Dr Victorino DikeSam McGovern  . Coronary stent placement Right 12/12/2013    RT CORONARY  DES       DR COOPER   . Left heart catheterization with coronary/graft angiogram N/A 12/12/2013    Procedure: LEFT HEART CATHETERIZATION WITH Isabel CapriceORONARY/GRAFT ANGIOGRAM;  Surgeon: Micheline ChapmanMichael D Cooper, MD;  [facility];  Service: Cardiovascular;  Laterality: N/A;    Social History   Social History  . Marital Status: Married    Spouse Name: [name]  . Number of Children: 2  . Years of Education: Bachelors   Occupational History  . retired     Social History Main Topics  . Smoking status: Former Smoker    Quit date: 02/25/1979  . Smokeless tobacco: Never Used     Comment: smoked  1955-1980, up to 1 ppd  . Alcohol Use: Yes     Comment: 1/2 cup / day of wine  . Drug Use: No  . Sexual Activity: Not on file   Other Topics Concern  . Not on file   Social History Narrative   Patient is married ([place]) and lives at home with his wife.   Patient has two adult children- daughters x 2  ([name], [place])   Patient is retired.   Patient has a Bachelor's degree.   Patient is right-handed.   Patient drinks 1/2 liter of green tea daily.   Walks once week for exercise   Born in [place]         Medication List       This list is accurate as of: 09/18/15  9:27 PM.  Always use your most recent med list.               aspirin EC 81 MG tablet  Take 81 mg by mouth daily.     clonazePAM 0.5 MG tablet  Commonly known as:  KLONOPIN  Take 0.5 tablets (0.25 mg total) by mouth at bedtime. Prn  For REM BD and  can be used for Vertigo.     fluticasone 27.5 MCG/SPRAY nasal spray  Commonly known as:  VERAMYST  Place 1 spray into both nostrils daily. Reported on 09/18/2015     loratadine 10 MG tablet  Commonly known as:  CLARITIN  Take 10 mg by mouth daily.     nitroGLYCERIN 0.4 MG SL tablet  Commonly known as:  NITROSTAT  Place 1 tablet (0.4 mg total) under the tongue every 5 (five) minutes as needed for chest pain.     rosuvastatin 20 MG tablet  Commonly known as:  CRESTOR  Take 1 tablet (20 mg total) by mouth at bedtime.     ZANTAC 150 MG tablet  Generic drug:  ranitidine  Take 150 mg by mouth daily as needed for heartburn.           Objective:   Physical Exam BP 118/70 mmHg  Pulse 72  Temp(Src) 97.9 F (36.6 C) (Oral)  Ht  (1.651 m)  Wt 155 lb 6 oz (70.478 kg)  BMI 25.86 kg/m2  SpO2 97% General:   Well developed, well nourished . NAD.  HEENT:  Normocephalic . Face symmetric, atraumatic Neck: Range of motion normal, no cervical spine TTP Lungs:  CTA B Normal respiratory effort, no intercostal retractions, no accessory muscle use. Heart: RRR,  no murmur.  no pretibial edema bilaterally  Abdomen:  Not distended, soft, non-tender. No rebound or rigidity.  MSK: Elbows, hands and wrists were changes consistent with DJD but no synovitis. + Tenar atrophy, worse on the left  Skin: Not pale. Not jaundice Neurologic:  alert & oriented X3.  Speech normal, gait appropriate for age and unassisted Motor symmetric DTRs slightly decreased right ankle jerk. Psych--  Cognition and judgment appear intact.  Cooperative with normal attention span and concentration.  Behavior appropriate. No anxious or depressed appearing.    Assessment & Plan:   Assessment HTN Hyperlipidemia CV: dr [place] --CAD, CABG 2003, stent 2008, cath 2015 [facility] dc plavix 02-2015 --Aortic regurgitation, mild mod Sleep apnea. REM sleep d/o ---> CPAP. Clonazepam qhs prn Vertigo  Osteopenia GERD,  h/o PUD Memory impairment  Plan: Paresthesias, left fingers. 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 I recommend to see neurology for possibly  a NCS HTN: Well-controlled, check a BMP and CBC Fatigue: ongoing for more than a year, note from cardiology 02-2015 reviewed, he was already fatigue, d/c BB did not  help ,was recommended increase physical activity. Most likely fatigue will be multifactorial (including normal aging, mild depression, deconditioning); will check a B12, folic acid and sedimentation rate. Discussed role of counseling --> declined . Recommend gradual exercise and reassess on return to the office in May.

## 2015-09-19 LAB — BASIC METABOLIC PANEL
BUN: 14 mg/dL (ref 6–23)
CALCIUM: 9.7 mg/dL (ref 8.4–10.5)
CHLORIDE: 99 meq/L (ref 96–112)
CO2: 29 meq/L (ref 19–32)
CREATININE: 0.83 mg/dL (ref 0.40–1.50)
GFR: 95.15 mL/min (ref >60.00)
Glucose, Bld: 98 mg/dL (ref 70–99)
Potassium: 4.6 mEq/L (ref 3.5–5.1)
Sodium: 135 mEq/L (ref 135–145)

## 2015-09-19 LAB — CBC WITH DIFFERENTIAL/PLATELET
Basophils Absolute: 0 10*3/uL (ref 0.0–0.1)
Basophils Relative: 0.3 % (ref 0.0–3.0)
Eosinophils Absolute: 0.3 10*3/uL (ref 0.0–0.7)
Eosinophils Relative: 4 % (ref 0.0–5.0)
HCT: 43.9 % (ref 39.0–52.0)
Hemoglobin: 15.1 g/dL (ref 13.0–17.0)
Lymphocytes Relative: 14.2 % (ref 12.0–46.0)
Lymphs Abs: 1.2 10*3/uL (ref 0.7–4.0)
MCHC: 34.5 g/dL (ref 30.0–36.0)
MCV: 87.6 fl (ref 78.0–100.0)
Monocytes Absolute: 0.6 10*3/uL (ref 0.1–1.0)
Monocytes Relative: 7 % (ref 3.0–12.0)
Neutro Abs: 6.1 10*3/uL (ref 1.4–7.7)
Neutrophils Relative %: 74.5 % (ref 43.0–77.0)
Platelets: 199 10*3/uL (ref 150.0–400.0)
RBC: 5.01 Mil/uL (ref 4.22–5.81)
RDW: 13.3 % (ref 11.5–15.5)
WBC: 8.1 10*3/uL (ref 4.0–10.5)

## 2015-09-19 LAB — FOLATE: FOLATE: 17.1 ng/mL (ref >5.9)

## 2015-09-19 LAB — VITAMIN B12: Vitamin B-12: 358 pg/mL (ref 211–911)

## 2015-09-19 LAB — SEDIMENTATION RATE: Sed Rate: 13 mm/hr (ref 0–22)

## 2015-10-01 ENCOUNTER — Other Ambulatory Visit: Payer: Self-pay | Admitting: Cardiology

## 2015-10-01 NOTE — Telephone Encounter (Signed)
Rx(s) sent to pharmacy electronically.  

## 2015-10-09 ENCOUNTER — Ambulatory Visit (INDEPENDENT_AMBULATORY_CARE_PROVIDER_SITE_OTHER): Payer: Medicare Other | Admitting: Neurology

## 2015-10-09 ENCOUNTER — Encounter: Payer: Self-pay | Admitting: Neurology

## 2015-10-09 VITALS — BP 118/84 | HR 86 | Resp 20 | Ht 63.0 in | Wt 151.0 lb

## 2015-10-09 DIAGNOSIS — G5621 Lesion of ulnar nerve, right upper limb: Secondary | ICD-10-CM | POA: Diagnosis not present

## 2015-10-09 DIAGNOSIS — G4737 Central sleep apnea in conditions classified elsewhere: Secondary | ICD-10-CM | POA: Diagnosis not present

## 2015-10-09 DIAGNOSIS — G5620 Lesion of ulnar nerve, unspecified upper limb: Secondary | ICD-10-CM | POA: Insufficient documentation

## 2015-10-09 NOTE — Patient Instructions (Signed)
Calcium; Vitamin D oral tablets What is this medicine? CALCIUM; VITAMIN D (KAL see um; VYE ta min D) is a vitamin supplement. It is used to prevent conditions of low calcium and vitamin D. This medicine may be used for other purposes; ask your health care provider or pharmacist if you have questions. What should I tell my health care provider before I take this medicine? They need to know if you have any of these conditions: -constipation -dehydration -heart disease -high level of calcium or vitamin D in the blood -high level of phosphate in the blood -kidney disease -kidney stones -liver disease -parathyroid disease -sarcoidosis -stomach ulcer or obstruction -an unusual or allergic reaction to calcium, vitamin D, tartrazine dye, other medicines, foods, dyes, or preservatives -pregnant or trying to get pregnant -breast-feeding How should I use this medicine? Take this medicine by mouth with a glass of water. Follow the directions on the label. Take with food or within 1 hour after a meal. Take your medicine at regular intervals. Do not take your medicine more often than directed. Talk to your pediatrician regarding the use of this medicine in children. While this medicine may be used in children for selected conditions, precautions do apply. Overdosage: If you think you have taken too much of this medicine contact a poison control center or emergency room at once. NOTE: This medicine is only for you. Do not share this medicine with others. What if I miss a dose? If you miss a dose, take it as soon as you can. If it is almost time for your next dose, take only that dose. Do not take double or extra doses. What may interact with this medicine? Do not take this medicine with any of the following medications: -ammonium chloride -methenamine This medicine may also interact with the following medications: -antibiotics like ciprofloxacin, gatifloxacin,  tetracycline -captopril -delavirdine -diuretics -gabapentin -iron supplements -medicines for fungal infections like ketoconazole and itraconazole -medicines for seizures like ethotoin and phenytoin -mineral oil -mycophenolate -other vitamins with calcium, vitamin D, or minerals -quinidine -rosuvastatin -sucralfate -thyroid medicine This list may not describe all possible interactions. Give your health care provider a list of all the medicines, herbs, non-prescription drugs, or dietary supplements you use. Also tell them if you smoke, drink alcohol, or use illegal drugs. Some items may interact with your medicine. What should I watch for while using this medicine? Taking this medicine is not a substitute for a well-balanced diet and exercise. Talk with your doctor or health care provider and follow a healthy lifestyle. Do not take this medicine with high-fiber foods, large amounts of alcohol, or drinks containing caffeine. Do not take this medicine within 2 hours of any other medicines. What side effects may I notice from receiving this medicine? Side effects that you should report to your doctor or health care professional as soon as possible: -allergic reactions like skin rash, itching or hives, swelling of the face, lips, or tongue -confusion -dry mouth -high blood pressure -increased hunger or thirst -increased urination -irregular heartbeat -metallic taste -muscle or bone pain -pain when urinating -seizure -unusually weak or tired -weight loss Side effects that usually do not require medical attention (report to your doctor or health care professional if they continue or are bothersome): -constipation -diarrhea -headache -loss of appetite -nausea, vomiting -stomach upset This list may not describe all possible side effects. Call your doctor for medical advice about side effects. You may report side effects to FDA at 1-800-FDA-1088. Where should I keep   my medicine? Keep  out of the reach of children. Store at room temperature between 15 and 30 degrees C (59 and 86 degrees F). Protect from light. Keep container tightly closed. Throw away any unused medicine after the expiration date. NOTE: This sheet is a summary. It may not cover all possible information. If you have questions about this medicine, talk to your doctor, pharmacist, or health care provider.    2016, Elsevier/Gold Standard. (2007-09-15 17:56:23)  
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
## 2015-10-10 ENCOUNTER — Ambulatory Visit (INDEPENDENT_AMBULATORY_CARE_PROVIDER_SITE_OTHER): Payer: Medicare Other | Admitting: Neurology

## 2015-10-10 ENCOUNTER — Ambulatory Visit (INDEPENDENT_AMBULATORY_CARE_PROVIDER_SITE_OTHER): Payer: Self-pay | Admitting: Neurology

## 2015-10-10 DIAGNOSIS — G4737 Central sleep apnea in conditions classified elsewhere: Secondary | ICD-10-CM

## 2015-10-10 DIAGNOSIS — G5621 Lesion of ulnar nerve, right upper limb: Secondary | ICD-10-CM | POA: Diagnosis not present

## 2015-10-10 DIAGNOSIS — Z0289 Encounter for other administrative examinations: Secondary | ICD-10-CM

## 2015-10-10 DIAGNOSIS — M5416 Radiculopathy, lumbar region: Secondary | ICD-10-CM

## 2015-10-10 DIAGNOSIS — R202 Paresthesia of skin: Secondary | ICD-10-CM

## 2015-10-10 NOTE — Procedures (Addendum)
   NCS (NERVE CONDUCTION STUDY) WITH EMG (ELECTROMYOGRAPHY) REPORT   STUDY DATE: October 10 2015 PATIENT NAME: Matthew LopesJuan A [name] DOB: 1934-08-05 MRN: [id number]    TECHNOLOGIST: Gearldine ShownLorraine Jones ELECTROMYOGRAPHER: Levert FeinsteinYan, Roza Creamer M.D.  CLINICAL INFORMATION:  81 years old right-handed male, complains 3 months history of left-sided neck pain, radiating pain to left shoulder, lateral arm, left lateral 3 fingers persistent numbness, mild weakness, he also complains of chronic low back pain, radiating pain to bilateral hip, neurogenic claudication.  On examination: He has mild left intrinsic hand muscle atrophy, he has mild left shoulder abduction, external rotation weakness, mild weakness at left hand grip, finger abduction.  He has no proximal lower extremity weakness, only has mild bilateral toe flexion extension weakness.  His deep tendon reflex is hyporeflexia and symmetric.  FINDINGS: NERVE CONDUCTION STUDY: Bilateral median and ulnar sensory responses showed mild to moderately prolonged peak latency, with well-preserved snap amplitude. This could be due to cold limb temperature.  Bilateral ulnar motor responses were normal. Bilateral median motor responses showed moderately prolonged distal latency, right worse than left, with well-preserved C map amplitude, conduction velocity. Bilateral median motor F wave latency was mild to moderately prolonged.   Bilateral peroneal sensory responses were normal. Bilateral tibial motor responses were normal. Bilateral tibial H reflexes were normal and symmetric. Left peroneal to EDB motor responses were normal. Right peroneal to EDB motor responses showed mildly decreased to C map amplitude, with normal distal latency, conduction velocity.  NEEDLE ELECTROMYOGRAPHY: Selected needle examinations was performed at left upper, left lower extremity muscles and left cervical, lumbar sacral paraspinal muscles.  Left tibialis anterior, tibialis posterior, medial  gastrocnemius, peroneal longus, vastus lateralis,: Increased insertion activity, no spontaneous activity, mildly enlarged complex motor unit potential, with mildly decreased recruitment patterns.   There was no spontaneous activity at left C6, 7, left T1 paraspinal muscles.  Left first dorsal interossei: Increased insertional activity, 1 plus positive waves, enlarged complex motor unit potential with decreased recruitment patterns.  Left pronator teres, triceps, brachioradialis: Increased insertion activity, no spontaneous activity, mildly enlarged complex motor unit potential, with mildly decreased recruitment patterns.  Left biceps, deltoid: Normal insertion activity, no spontaneous activity, mixture of normal and some enlarged motor unit potential, with mildly decreased recruitment patterns.     There was no spontaneous activity at left lumbar sacral paraspinal muscles, left L4-5 S1.   IMPRESSION:   This is an abnormal study. There is electrodiagnostic evidence of chronic neuropathic changes involving left cervical myotomes, consistent with chronic cervical radiculopathy, mainly involving left C6, 7, T1 myotomes. There is no evidence of active process. In addition, there is evidence of chronic lumbar sacral radiculopathy, involving left L4-5 S1 myotomes. There is also evidence of bilateral median neuropathy across the wrist, consistent with moderate bilateral carpal tunnel syndromes.    INTERPRETING PHYSICIAN:   Levert FeinsteinYan, Trayson Stitely M.D. Ph.D. [facility] [address], [address] [place], [place] [address] [phone]

## 2015-10-10 NOTE — Progress Notes (Signed)
Please see separate EMG report under procedure section

## 2015-10-15 ENCOUNTER — Telehealth: Payer: Self-pay

## 2015-10-15 NOTE — Telephone Encounter (Signed)
Spoke to pt and advised him that his NCV/EMG showed abnormalities related to a cervical myotome and Dr. Vickey Hugerohmeier recommends a referral to a neurosurgeon. Pt states that he wants to discuss the results with Dr. Vickey Hugerohmeier before accepting the neurosurgeon referral. Appt made for 10/17/15 at 11:00. Pt verbalized understanding.

## 2015-10-15 NOTE — Telephone Encounter (Signed)
-----   Message from Melvyn Novasarmen Dohmeier, MD sent at 10/11/2015  5:13 PM EDT ----- Abnormalities related to let v cervical myotome, referral to neurosurgeon / dr Juanetta Goslinghawkins.

## 2015-10-17 ENCOUNTER — Ambulatory Visit (INDEPENDENT_AMBULATORY_CARE_PROVIDER_SITE_OTHER): Payer: Medicare Other | Admitting: Neurology

## 2015-10-17 ENCOUNTER — Encounter: Payer: Self-pay | Admitting: Neurology

## 2015-10-17 VITALS — BP 102/70 | HR 76 | Resp 20 | Ht 63.0 in | Wt 155.0 lb

## 2015-10-17 DIAGNOSIS — M5412 Radiculopathy, cervical region: Secondary | ICD-10-CM

## 2015-10-17 DIAGNOSIS — M5416 Radiculopathy, lumbar region: Secondary | ICD-10-CM | POA: Insufficient documentation
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
## 2015-10-22 ENCOUNTER — Telehealth: Payer: Self-pay | Admitting: Internal Medicine

## 2015-10-22 ENCOUNTER — Telehealth: Payer: Self-pay | Admitting: Behavioral Health

## 2015-10-22 NOTE — Telephone Encounter (Signed)
Error

## 2015-10-22 NOTE — Telephone Encounter (Signed)
Unable to reach patient at time of Pre-Visit Call.  Left message for patient to return call when available.    

## 2015-10-23 ENCOUNTER — Encounter: Payer: Self-pay | Admitting: Internal Medicine

## 2015-10-23 ENCOUNTER — Ambulatory Visit (INDEPENDENT_AMBULATORY_CARE_PROVIDER_SITE_OTHER): Payer: Medicare Other | Admitting: Internal Medicine

## 2015-10-23 VITALS — BP 108/66 | HR 78 | Temp 97.8°F | Ht 63.0 in | Wt 154.2 lb

## 2015-10-23 DIAGNOSIS — M40209 Unspecified kyphosis, site unspecified: Secondary | ICD-10-CM

## 2015-10-23 DIAGNOSIS — R5383 Other fatigue: Secondary | ICD-10-CM

## 2015-10-23 DIAGNOSIS — Z0000 Encounter for general adult medical examination without abnormal findings: Secondary | ICD-10-CM | POA: Diagnosis not present

## 2015-10-23 DIAGNOSIS — E785 Hyperlipidemia, unspecified: Secondary | ICD-10-CM | POA: Diagnosis not present

## 2015-10-23 DIAGNOSIS — Z09 Encounter for follow-up examination after completed treatment for conditions other than malignant neoplasm: Secondary | ICD-10-CM

## 2015-10-23 DIAGNOSIS — Z23 Encounter for immunization: Secondary | ICD-10-CM | POA: Diagnosis not present

## 2015-10-23 DIAGNOSIS — M5410 Radiculopathy, site unspecified: Secondary | ICD-10-CM | POA: Diagnosis not present

## 2015-10-23 LAB — LIPID PANEL
CHOLESTEROL: 142 mg/dL (ref 0–200)
HDL: 55.2 mg/dL (ref >39.00)
LDL Cholesterol: 70 mg/dL (ref 0–99)
NonHDL: 86.92
TRIGLYCERIDES: 83 mg/dL (ref 0.0–149.0)
Total CHOL/HDL Ratio: 3
VLDL: 16.6 mg/dL (ref 0.0–40.0)

## 2015-10-23 LAB — ALT: ALT: 20 U/L (ref 0–53)

## 2015-10-23 LAB — AST: AST: 19 U/L (ref 0–37)

## 2015-10-23 NOTE — Progress Notes (Signed)
Pre visit review using our clinic review tool, if applicable. No additional management support is needed unless otherwise documented below in the visit note. 

## 2015-10-23 NOTE — Patient Instructions (Signed)
Get your blood work before you leave   Next visit in 4 months    Fall Prevention and Home Safety Falls cause injuries and can affect all age groups. It is possible to use preventive measures to significantly decrease the likelihood of falls. There are many simple measures which can make your home safer and prevent falls. OUTDOORS  Repair cracks and edges of walkways and driveways.  Remove high doorway thresholds.  Trim shrubbery on the main path into your home.  Have good outside lighting.  Clear walkways of tools, rocks, debris, and clutter.  Check that handrails are not broken and are securely fastened. Both sides of steps should have handrails.  Have leaves, snow, and ice cleared regularly.  Use sand or salt on walkways during winter months.  In the garage, clean up grease or oil spills. BATHROOM  Install night lights.  Install grab bars by the toilet and in the tub and shower.  Use non-skid mats or decals in the tub or shower.  Place a plastic non-slip stool in the shower to sit on, if needed.  Keep floors dry and clean up all water on the floor immediately.  Remove soap buildup in the tub or shower on a regular basis.  Secure bath mats with non-slip, double-sided rug tape.  Remove throw rugs and tripping hazards from the floors. BEDROOMS  Install night lights.  Make sure a bedside light is easy to reach.  Do not use oversized bedding.  Keep a telephone by your bedside.  Have a firm chair with side arms to use for getting dressed.  Remove throw rugs and tripping hazards from the floor. KITCHEN  Keep handles on pots and pans turned toward the center of the stove. Use back burners when possible.  Clean up spills quickly and allow time for drying.  Avoid walking on wet floors.  Avoid hot utensils and knives.  Position shelves so they are not too high or low.  Place commonly used objects within easy reach.  If necessary, use a sturdy step stool  with a grab bar when reaching.  Keep electrical cables out of the way.  Do not use floor polish or wax that makes floors slippery. If you must use wax, use non-skid floor wax.  Remove throw rugs and tripping hazards from the floor. STAIRWAYS  Never leave objects on stairs.  Place handrails on both sides of stairways and use them. Fix any loose handrails. Make sure handrails on both sides of the stairways are as long as the stairs.  Check carpeting to make sure it is firmly attached along stairs. Make repairs to worn or loose carpet promptly.  Avoid placing throw rugs at the top or bottom of stairways, or properly secure the rug with carpet tape to prevent slippage. Get rid of throw rugs, if possible.  Have an electrician put in a light switch at the top and bottom of the stairs. OTHER FALL PREVENTION TIPS  Wear low-heel or rubber-soled shoes that are supportive and fit well. Wear closed toe shoes.  When using a stepladder, make sure it is fully opened and both spreaders are firmly locked. Do not climb a closed stepladder.  Add color or contrast paint or tape to grab bars and handrails in your home. Place contrasting color strips on first and last steps.  Learn and use mobility aids as needed. Install an electrical emergency response system.  Turn on lights to avoid dark areas. Replace light bulbs that burn out immediately. Get   light switches that glow.  Arrange furniture to create clear pathways. Keep furniture in the same place.  Firmly attach carpet with non-skid or double-sided tape.  Eliminate uneven floor surfaces.  Select a carpet pattern that does not visually hide the edge of steps.  Be aware of all pets. OTHER HOME SAFETY TIPS  Set the water temperature for 120 F (48.8 C).  Keep emergency numbers on or near the telephone.  Keep smoke detectors on every level of the home and near sleeping areas. Document Released: 05/23/2002 Document Revised: 12/02/2011  Document Reviewed: 08/22/2011 ExitCare Patient Information 2015 [employer]. This information is not intended to replace advice given to you by your health care provider. Make sure you discuss any questions you have with your health care provider.   Preventive Care for Adults Ages 65 and over  Blood pressure check.** / Every 1 to 2 years.  Lipid and cholesterol check.**/ Every 5 years beginning at age 20.  Lung cancer screening. / Every year if you are aged 55-80 years and have a 30-pack-year history of smoking and currently smoke or have quit within the past 15 years. Yearly screening is stopped once you have quit smoking for at least 15 years or develop a health problem that would prevent you from having lung cancer treatment.  Fecal occult blood test (FOBT) of stool. / Every year beginning at age 50 and continuing until age 75. You may not have to do this test if you get a colonoscopy every 10 years.  Flexible sigmoidoscopy** or colonoscopy.** / Every 5 years for a flexible sigmoidoscopy or every 10 years for a colonoscopy beginning at age 50 and continuing until age 75.  Hepatitis C blood test.** / For all people born from 1945 through 1965 and any individual with known risks for hepatitis C.  Abdominal aortic aneurysm (AAA) screening.** / A one-time screening for ages 65 to 75 years who are current or former smokers.  Skin self-exam. / Monthly.  Influenza vaccine. / Every year.  Tetanus, diphtheria, and acellular pertussis (Tdap/Td) vaccine.** / 1 dose of Td every 10 years.  Varicella vaccine.** / Consult your health care provider.  Zoster vaccine.** / 1 dose for adults aged 60 years or older.  Pneumococcal 13-valent conjugate (PCV13) vaccine.** / Consult your health care provider.  Pneumococcal polysaccharide (PPSV23) vaccine.** / 1 dose for all adults aged 65 years and older.  Meningococcal vaccine.** / Consult your health care provider.  Hepatitis A vaccine.** /  Consult your health care provider.  Hepatitis B vaccine.** / Consult your health care provider.  Haemophilus influenzae type b (Hib) vaccine.** / Consult your health care provider. **Family history and personal history of risk and conditions may change your health care provider's recommendations. Document Released: 07/29/2001 Document Revised: 06/07/2013 Document Reviewed: 10/28/2010 ExitCare Patient Information 2015 [employer]. This information is not intended to replace advice given to you by your health care provider. Make sure you discuss any questions you have with your health care provider.   

## 2015-10-23 NOTE — Progress Notes (Signed)
Subjective:    Patient ID: [name], male    DOB: December 12, 1934, 81 y.o.   MRN: [id number]  DOS:  10/23/2015 Type of visit - description :  Complete physical exam Interval history: Was seen by neurology, note reviewed, was rec to see rehabilitation medicine for chronic radiculopathy. High cholesterol: Good compliance with Crestor CAD: No chest pain but continue with fatigue. See review of systems Osteopenia: Due for a bone density test    Review of Systems Constitutional: No fever. No chills. No unexplained wt changes. No unusual sweats  HEENT: No dental problems, no ear discharge, no facial swelling, no voice changes. No eye discharge, no eye  redness , no  intolerance to light   Respiratory: No wheezing , no  difficulty breathing. No cough , no mucus production  Cardiovascular: No CP, no leg swelling , no  Palpitations. Continue with some DOE, and fatigue with exertion.  GI: no nausea, no vomiting, no diarrhea , no  abdominal pain.  No blood in the stools. No dysphagia, no odynophagia    Endocrine: No polyphagia, no polyuria , no polydipsia  GU: No dysuria, gross hematuria, difficulty urinating. No urinary urgency, no frequency.  Musculoskeletal: Today he reports back, hip and leg pain with exertion, he is able to walk approximately 200 yards before the symptoms start, pain decrease with rest  Skin: No change in the color of the skin, palor , no  Rash  Allergic, immunologic: No environmental allergies , no  food allergies  Neurological: No dizziness no  syncope. No headaches. No diplopia, no slurred, no slurred speech, no motor deficits, no facial  Numbness. Some short term memory issues  Hematological: No enlarged lymph nodes, no easy bruising , no unusual bleedings  Psychiatry: No suicidal ideas, no hallucinations, no beavior problems, no confusion.  No unusual/severe anxiety, no depression   Past Medical History  Diagnosis Date  . Hyperlipidemia   .  Hypertension   . Diverticulosis   . Gilbert's syndrome   . Osteopenia 2010     T score -1.9 @ hip (femoral neck)  . CAD S/P percutaneous coronary angioplasty 2003    Dr Antoine PocheHochrein: 2003: a) s/p CABG (LIMA-LAD, SVG-Diag);b) 2008: prior PCI to native RCA; c) 12/12/2013: SVG-Diag occluded, patent LIMA, 95% post stent in RCA --> PCI  with 2.75 x 18 mm Xience Alpine DES, residual dRCA-PLA 80=-90%, EF 55-65%  . PUD (peptic ulcer disease)     PMH , ? 1995  . Anginal pain (HCC)   . Heart murmur   . Shortness of breath   . Short-term memory loss   . GERD (gastroesophageal reflux disease)   . Arthritis     BACK & SHOULDERS    Past Surgical History  Procedure Laterality Date  . Hemorrhoid surgery    . Cholecystectomy  2004  . Cervical fusion  1997    Dr Jeral FruitBotero  . Coronary artery bypass graft  2003    Last catheterization was in August 2008 demonstrated a LIMA to th LAD which was patent, there was an atretic saphenous vein graft to the diagonal, the LAD had a 90% stenosis in the large calcified segment, the diagonal has ostial 70% stenosis, the circumflex had a ramus intermediate with ostial 25% stenosis, the right coronary artery was dominant.  There was an ulcerated 80%-90% stenosis.    . Tonsillectomy and adenoidectomy    . Coronary stent placement  2008    drug-eluting stent to right coronary artery  .  Upper gastrointestinal endoscopy       Dr Victorino Dike  . Colonoscopy      Dr Victorino Dike  . Coronary stent placement Right 12/12/2013    RT CORONARY  DES       DR COOPER   . Left heart catheterization with coronary/graft angiogram N/A 12/12/2013    Procedure: LEFT HEART CATHETERIZATION WITH Isabel Caprice;  Surgeon: Micheline Chapman, MD;  Location: [facility] CATH LAB;  Service: Cardiovascular;  Laterality: N/A;    Social History   Social History  . Marital Status: Married    Spouse Name: [name]  . Number of Children: 2  . Years of Education: Bachelors   Occupational History  .  retired     Social History Main Topics  . Smoking status: Former Smoker    Quit date: 02/25/1979  . Smokeless tobacco: Never Used     Comment: smoked 1955-1980, up to 1 ppd  . Alcohol Use: Yes     Comment: 1/2 cup / day of wine  . Drug Use: No  . Sexual Activity: Not on file   Other Topics Concern  . Not on file   Social History Narrative   Patient is married (Jones) and lives at home with his wife.   Patient has two adult children- daughters x 2  ([name], [place])   Patient is retired.   Patient has a Bachelor's degree.   Patient is right-handed.   Patient drinks 1/2 liter of green tea daily.   Walks once week for exercise   Born in [place]      Family History  Problem Relation Age of Onset  . Cancer Father     ? primary  . Uterine cancer Daughter   . Hypertension Maternal Grandmother   . Osteoporosis Sister   . Diabetes Sister   . Stroke Neg Hx   . Peripheral vascular disease Maternal Grandmother   . Heart attack Neg Hx   . Colon cancer Neg Hx   . Prostate cancer Neg Hx        Medication List       This list is accurate as of: 10/23/15  4:57 PM.  Always use your most recent med list.               aspirin EC 81 MG tablet  Take 81 mg by mouth daily.     clonazePAM 0.5 MG tablet  Commonly known as:  KLONOPIN  Take 0.5 tablets (0.25 mg total) by mouth at bedtime. Prn  For REM BD and can be used for Vertigo.     diphenhydrAMINE 25 MG tablet  Commonly known as:  SOMINEX  Take 25 mg by mouth at bedtime as needed for allergies.     fluticasone 27.5 MCG/SPRAY nasal spray  Commonly known as:  VERAMYST  Place 1 spray into both nostrils daily. Reported on 09/18/2015     loratadine 10 MG tablet  Commonly known as:  CLARITIN  Take 10 mg by mouth daily. Reported on 10/23/2015     MULTIVITAMIN ADULT PO  Take 0.5 tablets by mouth daily.     nitroGLYCERIN 0.4 MG SL tablet  Commonly known as:  NITROSTAT  Place 1 tablet (0.4 mg total) under the tongue every  5 (five) minutes as needed for chest pain.     rosuvastatin 20 MG tablet  Commonly known as:  CRESTOR  TAKE 1 TABLET (20 MG TOTAL) BY MOUTH AT BEDTIME.  Objective:   Physical Exam BP 108/66 mmHg  Pulse 78  Temp(Src) 97.8 F (36.6 C) (Oral)  Ht  (1.6 m)  Wt 154 lb 4 oz (69.967 kg)  BMI 27.33 kg/m2  SpO2 97%  General:   Well developed, well nourished . NAD.  Neck: No  Thyromegaly  HEENT:  Normocephalic . Face symmetric, atraumatic Lungs:  CTA B Normal respiratory effort, no intercostal retractions, no accessory muscle use. Heart: RRR,  no murmur.  No pretibial edema bilaterally  Normal femoral-pedal pulses B Abdomen:  Not distended, soft, non-tender. No rebound or rigidity.   MSK: Prominent kyphosis noted. Hands with bony enlargement consistent with OA but no synovitis Skin: Exposed areas without rash. Not pale. Not jaundice Neurologic:  alert & oriented X3.  Speech normal, gait appropriate for age and unassisted Strength symmetric and appropriate for age.  Psych: Cognition and judgment appear intact.  Cooperative with normal attention span and concentration.  Behavior appropriate. No anxious or depressed appearing.    Assessment & Plan:   Assessment HTN Hyperlipidemia CV: dr [place] --CAD, CABG 2003, stent 2008, cath 2015 [facility]), dc plavix 02-2015 --Aortic regurgitation, mild mod OSA  --- REM sleep d/o ---> CPAP. Clonazepam qhs prn Vertigo  MSK --DJD --Severe kyphosis ---NCS 4-2017chronic radiucolopathy left C 6-7 and T1 and at L 4-5 and S1, saw neuro, rx conservative treatment  ---Osteopenia, nl vit D 2013 GERD, h/o PUD Short term memory impairment    PLAN Hyperlipidemia: Check a FLP, LFTs CAD: Asx, due to see cardiology. Has an appointment pending for June. MSK: -kyphosis: Patient quite concerned about kyphosis, would like to see a specialist, refer to Dr. Doroteo Bradford understanding that all may be Rx is PT  -Radiculopathy, chronic,  neurology recommended a referral  to Dr. Fritzi Mandes. Will arrange -Claudication as described above: Ongoing problem, cardiology note from 02-2015 reviewed, not likely to be vascular. Normal pulses today . Spinal stenosis?. Fatigue: Chronic, recent B12 and sedimentation rate negative. See previous entry. Observation for now. Osteopenia: Check a density test, calcium and vitamin D recommended RTC 4 months

## 2015-10-23 NOTE — Assessment & Plan Note (Signed)
Hyperlipidemia: Check a FLP, LFTs CAD: Asx, due to see cardiology. Has an appointment pending for June. MSK: -kyphosis: Patient quite concerned about kyphosis, would like to see a specialist, refer to Dr. Doroteo [name] understanding that all may be Rx is PT  -Radiculopathy, chronic, neurology recommended a referral  to Dr. Fritzi [name]. Will arrange -Claudication as described above: Ongoing problem, cardiology note from 02-2015 reviewed, not likely to be vascular. Normal pulses today . Spinal stenosis?. Fatigue: Chronic, recent B12 and sedimentation rate negative. See previous entry. Observation for now. Osteopenia: Check a density test, calcium and vitamin D recommended RTC 4 months

## 2015-10-23 NOTE — Assessment & Plan Note (Addendum)
Td -- today prevnar 2016 PNM 23-- today zostavax -- discuss at the next opportunity   Normal colonoscopy 12-2005. Prostate cancer screening: No recent PSA. He is asx, pros and cons of further colon and prostate cancer screening discussed. Elected no further screening. Counseled-- Diet, exercise, calcium, vitamin D, fall prevention, end-of-life (MOST form)

## 2015-11-29 ENCOUNTER — Ambulatory Visit: Payer: Medicare Other | Admitting: Adult Health

## 2015-12-10 ENCOUNTER — Encounter: Payer: Self-pay | Admitting: Cardiology

## 2015-12-10 ENCOUNTER — Ambulatory Visit (INDEPENDENT_AMBULATORY_CARE_PROVIDER_SITE_OTHER): Payer: Medicare Other | Admitting: Cardiology

## 2015-12-10 VITALS — BP 123/72 | HR 73 | Ht 64.0 in | Wt 152.6 lb

## 2015-12-10 DIAGNOSIS — I10 Essential (primary) hypertension: Secondary | ICD-10-CM

## 2015-12-10 DIAGNOSIS — I351 Nonrheumatic aortic (valve) insufficiency: Secondary | ICD-10-CM

## 2015-12-10 DIAGNOSIS — I2510 Atherosclerotic heart disease of native coronary artery without angina pectoris: Secondary | ICD-10-CM | POA: Diagnosis not present

## 2015-12-10 DIAGNOSIS — R0600 Dyspnea, unspecified: Secondary | ICD-10-CM

## 2015-12-10 DIAGNOSIS — Z9861 Coronary angioplasty status: Secondary | ICD-10-CM

## 2015-12-10 DIAGNOSIS — R0609 Other forms of dyspnea: Secondary | ICD-10-CM

## 2015-12-10 NOTE — Progress Notes (Signed)
[name] Date of Birth: [birth year] Medical Record #[id number]  History of Present Illness: [name] is seen for follow up CAD. He is s/p CABG in 2003 by Matthew. Cornelius [name] with LIMA to the LAD and SVG to the Diagonal. By cath in 2008 he had a high grade proximal RCA stenosis and had stenting with a 2.75 mm Promus stent by Matthew. Juanda [name]. He was noted to have an atretic SVG to the diagonal but it was felt that antegrade flow into the diagonal was OK. His last stress Myoview in November 2012 showed a fixed apical defect without ischemia and EF 62%.  In May 2015 he presented with unstable angina. Cardiac cath showed patent LIMA to the LAD and occlusion of SVG to the diagonal. There was a severe stenosis in the RCA which was stented with a DES. He also had an Echo in 9/15 for a murmur and has AV sclerosis with mild to moderate AI.   On follow up today he has multiple complaints. He notes lack of energy. He is limited by bilateral pain in back and hips. His legs are weak. Some SOB with exertion- this has not changed. He has occasional vertigo. Only chest pain is a "pinch" in his sternum.  When he has to reach overhead his arms tire easily. None of these symptoms are like his angina.     Medication List       This list is accurate as of: 12/10/15  5:50 PM.  Always use your most recent med list.               aspirin EC 81 MG tablet  Take 81 mg by mouth daily.     clonazePAM 0.5 MG tablet  Commonly known as:  KLONOPIN  Take 0.5 tablets (0.25 mg total) by mouth at bedtime. Prn  For REM BD and can be used for Vertigo.     diphenhydrAMINE 25 MG tablet  Commonly known as:  SOMINEX  Take 25 mg by mouth at bedtime as needed for allergies.     fluticasone 27.5 MCG/SPRAY nasal spray  Commonly known as:  VERAMYST  Place 1 spray into both nostrils daily. Reported on 09/18/2015     MULTIVITAMIN ADULT PO  Take 0.5 tablets by mouth daily.     nitroGLYCERIN 0.4 MG SL tablet  Commonly known as:   NITROSTAT  Place 1 tablet (0.4 mg total) under the tongue every 5 (five) minutes as needed for chest pain.     rosuvastatin 20 MG tablet  Commonly known as:  CRESTOR  TAKE 1 TABLET (20 MG TOTAL) BY MOUTH AT BEDTIME.         Allergies  Allergen Reactions  . Codeine Nausea Only    Past Medical History  Diagnosis Date  . Hyperlipidemia   . Hypertension   . Diverticulosis   . Gilbert's syndrome   . Osteopenia 2010     T score -1.9 @ hip (femoral neck)  . CAD S/P percutaneous coronary angioplasty 2003    [name]: 2003: a) s/p CABG (LIMA-LAD, SVG-Diag);b) 2008: prior PCI to native RCA; c) 12/12/2013: SVG-Diag occluded, patent LIMA, 95% post stent in RCA --> PCI  with 2.75 x 18 mm Xience Alpine DES, residual dRCA-PLA 80=-90%, EF 55-65%  . PUD (peptic ulcer disease)     PMH , ? 1995  . Anginal pain (HCC)   . Heart murmur   . Shortness of breath   . Short-term memory loss   .  GERD (gastroesophageal reflux disease)   . Arthritis     BACK & SHOULDERS    Past Surgical History  Procedure Laterality Date  . Hemorrhoid surgery    . Cholecystectomy  2004  . Cervical fusion  1997    [name]  . Coronary artery bypass graft  2003    Last catheterization was in August 2008 demonstrated a LIMA to th LAD which was patent, there was an atretic saphenous vein graft to the diagonal, the LAD had a 90% stenosis in the large calcified segment, the diagonal has ostial 70% stenosis, the circumflex had a ramus intermediate with ostial 25% stenosis, the right coronary artery was dominant.  There was an ulcerated 80%-90% stenosis.    . Tonsillectomy and adenoidectomy    . Coronary stent placement  2008    drug-eluting stent to right coronary artery  . Upper gastrointestinal endoscopy       [name]  . Colonoscopy      [name]  . Coronary stent placement Right 12/12/2013    RT CORONARY  DES       [name]   . Left heart catheterization with coronary/graft [name] N/A 12/12/2013     Procedure: LEFT HEART CATHETERIZATION WITH [name];  Surgeon: Matthew ChapmanMichael D Cooper, MD;  [facility];  Service: Cardiovascular;  Laterality: N/A;    Social History   Social History  . Marital Status: Married    Spouse Name: [name]  . Number of Children: 2  . Years of Education: Bachelors   Occupational History  . retired     Social History Main Topics  . Smoking status: Former Smoker    Quit date: 02/25/1979  . Smokeless tobacco: Never Used     Comment: smoked 1955-1980, up to 1 ppd  . Alcohol Use: Yes     Comment: 1/2 cup / day of wine  . Drug Use: No  . Sexual Activity: Not Asked   Other Topics Concern  . 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   Patient has two adult children- daughters x 2  ([name], [name])   Patient is retired.   Patient has a Bachelor's degree.   Patient is right-handed.   Patient drinks 1/2 liter of green tea daily.   Walks once week for exercise   [place]     Family History  Problem Relation Age of Onset  . Cancer Father     ? primary  . Uterine cancer Daughter   . Hypertension Maternal Grandmother   . Osteoporosis Sister   . Diabetes Sister   . Stroke Neg Hx   . Peripheral vascular disease Maternal Grandmother   . Heart attack Neg Hx   . Colon cancer Neg Hx   . Prostate cancer Neg Hx     Review of Systems: As noted in HPI.  All other systems were reviewed and are negative.  Physical Exam: BP 123/72 mmHg  Pulse 73  Ht 5\' 4"  (1.626 m)  Wt 152 lb 9.6 oz (69.219 kg)  BMI 26.18 kg/m2 Filed Weights   12/10/15 0928  Weight: 152 lb 9.6 oz (69.219 kg)  GENERAL:  Well appearing WM in NAD. HEENT:  PERRL, EOMI, sclera are clear. Oropharynx is clear. NECK:  No jugular venous distention, carotid upstroke brisk and symmetric, no bruits, no thyromegaly or adenopathy LUNGS:  Clear to auscultation bilaterally CHEST:  Unremarkable HEART:  RRR,   PMI not displaced or sustained,S1 and S2 within normal limits, Grade 2/6 harsh systolic murmur at the apex with soft diastolic murmur at the LSB.  ABD:  Soft, nontender. BS +, no masses or bruits. No hepatomegaly, no splenomegaly Spine: kyphotic. EXT:  2 + pulses throughout, no edema, no cyanosis no clubbing SKIN:  Warm and dry.  No rashes NEURO:  Alert and oriented x 3. Cranial nerves II through XII intact. PSYCH:  Cognitively intact    LABORATORY DATA:  Lab Results  Component Value Date   WBC 8.1 09/18/2015   HGB 15.1 09/18/2015   HCT 43.9 09/18/2015   PLT 199.0 09/18/2015   GLUCOSE 98 09/18/2015   CHOL 142 10/23/2015   TRIG 83.0 10/23/2015   HDL 55.20 10/23/2015   LDLCALC 70 10/23/2015   ALT 20 10/23/2015   AST 19 10/23/2015   NA 135 09/18/2015   K 4.6 09/18/2015   CL 99 09/18/2015   CREATININE 0.83 09/18/2015   BUN 14 09/18/2015   CO2 29 09/18/2015   TSH 1.69 10/24/2014   PSA 0.98 06/04/2006   INR 0.98 01/15/2014   HGBA1C 5.6 10/26/2014    Assessment / Plan: 1. CAD. Known CAD s/p CABG in 2003 and subsequent stenting of native RCA 2008. DES of the proximal RCA in June 2015.  Continue ASA. No significant anginal symptoms.   2. Mild to moderate AI.  Normal LV size and function. Asymptomatic. Will follow every 4-5 years unless status changes.   3. Hyperlipidemia- on statin. Well controlled.   4. Fatigue. No improvement with stopping metoprolol. Etiology is unclear. I suspect there is significant deconditioning.   5. Back/hip pain. Doubt this is a circulation issue. Pulses are good bilaterally. Review of pelvic CT 2015showed no obstruction in aortoiliac vessels.   6. Dyspnea- chronic. Suspect mostly due to deconditioning with restrictive pulmonary disease due to kyphosis.

## 2015-12-10 NOTE — Patient Instructions (Signed)
Continue your current therapy  I will see you in 6-8 months. 

## 2015-12-14 ENCOUNTER — Encounter: Payer: Self-pay | Admitting: Physical Medicine & Rehabilitation

## 2015-12-14 ENCOUNTER — Encounter: Payer: Medicare Other | Attending: Physical Medicine & Rehabilitation | Admitting: Physical Medicine & Rehabilitation

## 2015-12-14 DIAGNOSIS — R42 Dizziness and giddiness: Secondary | ICD-10-CM | POA: Insufficient documentation

## 2015-12-14 DIAGNOSIS — R269 Unspecified abnormalities of gait and mobility: Secondary | ICD-10-CM | POA: Diagnosis not present

## 2015-12-14 DIAGNOSIS — Z981 Arthrodesis status: Secondary | ICD-10-CM | POA: Diagnosis not present

## 2015-12-14 DIAGNOSIS — R200 Anesthesia of skin: Secondary | ICD-10-CM | POA: Insufficient documentation

## 2015-12-14 DIAGNOSIS — M5412 Radiculopathy, cervical region: Secondary | ICD-10-CM

## 2015-12-14 DIAGNOSIS — R251 Tremor, unspecified: Secondary | ICD-10-CM | POA: Diagnosis not present

## 2015-12-14 DIAGNOSIS — M1990 Unspecified osteoarthritis, unspecified site: Secondary | ICD-10-CM | POA: Diagnosis not present

## 2015-12-14 DIAGNOSIS — E785 Hyperlipidemia, unspecified: Secondary | ICD-10-CM | POA: Insufficient documentation

## 2015-12-14 DIAGNOSIS — R413 Other amnesia: Secondary | ICD-10-CM | POA: Diagnosis not present

## 2015-12-14 DIAGNOSIS — K219 Gastro-esophageal reflux disease without esophagitis: Secondary | ICD-10-CM | POA: Diagnosis not present

## 2015-12-14 DIAGNOSIS — I10 Essential (primary) hypertension: Secondary | ICD-10-CM | POA: Insufficient documentation

## 2015-12-14 DIAGNOSIS — M8580 Other specified disorders of bone density and structure, unspecified site: Secondary | ICD-10-CM | POA: Insufficient documentation

## 2015-12-14 DIAGNOSIS — R0602 Shortness of breath: Secondary | ICD-10-CM | POA: Insufficient documentation

## 2015-12-14 DIAGNOSIS — R531 Weakness: Secondary | ICD-10-CM | POA: Insufficient documentation

## 2015-12-14 DIAGNOSIS — Z8711 Personal history of peptic ulcer disease: Secondary | ICD-10-CM | POA: Insufficient documentation

## 2015-12-14 DIAGNOSIS — F419 Anxiety disorder, unspecified: Secondary | ICD-10-CM | POA: Diagnosis not present

## 2015-12-14 DIAGNOSIS — M25522 Pain in left elbow: Secondary | ICD-10-CM | POA: Diagnosis not present

## 2015-12-14 DIAGNOSIS — M4005 Postural kyphosis, thoracolumbar region: Secondary | ICD-10-CM

## 2015-12-14 DIAGNOSIS — Z87891 Personal history of nicotine dependence: Secondary | ICD-10-CM | POA: Diagnosis not present

## 2015-12-14 DIAGNOSIS — M40209 Unspecified kyphosis, site unspecified: Secondary | ICD-10-CM | POA: Insufficient documentation

## 2015-12-14 MED ORDER — DICLOFENAC SODIUM 1 % TD GEL: 2.0000 g | Freq: Four times a day (QID) | TRANSDERMAL | Status: DC

## 2015-12-14 NOTE — Progress Notes (Addendum)
Subjective:    Patient ID: [name], male    DOB: 1934/12/17, 81 y.o.   MRN: [id number]  HPI  81 y/o male with pmh of OA of back and shoulder, lumbar and cervical radiculopathy presents for numbness in 4th digits of left hand.  This started 4-5 months ago, stable.  He cannot identify alleviating factors.  Driving makes it worse.  He has similar symptoms on his later 2 digits of left foot. He has associated elbow pain. It is constant.   He also complains of kyphosis, which is getting progressively worse.    Pain Inventory Average Pain 4 Pain Right Now 4 My pain is aching  In the last 24 hours, has pain interfered with the following? General activity 5 Relation with others 5 Enjoyment of life 5 What TIME of day is your pain at its worst? morning Sleep (in general) Good  Pain is worse with: walking, standing and driving Pain improves with: nothing Relief from Meds: 0  Mobility walk without assistance how many minutes can you walk? 10 ability to climb steps?  yes do you drive?  yes transfers alone  Function retired I need assistance with the following:  household duties Do you have any goals in this area?  yes  Neuro/Psych weakness numbness tremor trouble walking dizziness anxiety loss of taste or smell  Prior Studies Any changes since last visit?  no nerve study new visit  Physicians involved in your care new visit   Family History  Problem Relation Age of Onset  . Cancer Father     ? primary  . Uterine cancer Daughter   . Hypertension Maternal Grandmother   . Osteoporosis Sister   . Diabetes Sister   . Stroke Neg Hx   . Peripheral vascular disease Maternal Grandmother   . Heart attack Neg Hx   . Colon cancer Neg Hx   . Prostate cancer Neg Hx    Social History   Social History  . Marital Status: Married    Spouse Name: [name]  . Number of Children: 2  . Years of Education: Bachelors   Occupational History  . retired     Social  History Main Topics  . Smoking status: Former Smoker    Quit date: 02/25/1979  . Smokeless tobacco: Never Used     Comment: smoked 1955-1980, up to 1 ppd  . Alcohol Use: Yes     Comment: 1/2 cup / day of wine  . Drug Use: No  . Sexual Activity: Not Asked   Other Topics Concern  . 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   Patient has two adult children- daughters x 2  ([name])   Patient is retired.   Patient has a Bachelor's degree.   Patient is right-handed.   Patient drinks 1/2 liter of green tea daily.   Walks once week for exercise   Born [place]    Past Surgical History  Procedure Laterality Date  . Hemorrhoid surgery    . Cholecystectomy  2004  . Cervical fusion  1997    Dr Jeral FruitBotero  . Coronary artery bypass graft  2003    Last catheterization was in August 2008 demonstrated a LIMA to th LAD which was patent, there was an atretic saphenous vein graft to the diagonal, the LAD had a 90% stenosis in the large calcified segment, the diagonal has ostial 70% stenosis, the circumflex  had a ramus intermediate with ostial 25% stenosis, the right coronary artery was dominant.  There was an ulcerated 80%-90% stenosis.    . Tonsillectomy and adenoidectomy    . Coronary stent placement  2008    drug-eluting stent to right coronary artery  . Upper gastrointestinal endoscopy       Dr Victorino DikeSam [place]  . Colonoscopy      Dr Victorino DikeSam [place]  . Coronary stent placement Right 12/12/2013    RT CORONARY  DES       DR COOPER   . Left heart catheterization with coronary/graft angiogram N/A 12/12/2013    Procedure: LEFT HEART CATHETERIZATION WITH Isabel CapriceORONARY/GRAFT ANGIOGRAM;  Surgeon: Micheline ChapmanMichael D Cooper, MD;  [facility];  Service: Cardiovascular;  Laterality: N/A;   Past Medical History  Diagnosis Date  . Hyperlipidemia   . Hypertension   . Diverticulosis   . Gilbert's syndrome   . Osteopenia 2010     T score -1.9 @ hip  (femoral neck)  . CAD S/P percutaneous coronary angioplasty 2003    Dr Antoine PocheHochrein: 2003: a) s/p CABG (LIMA-LAD, SVG-Diag);b) 2008: prior PCI to native RCA; c) 12/12/2013: SVG-Diag occluded, patent LIMA, 95% post stent in RCA --> PCI  with 2.75 x 18 mm Xience Alpine DES, residual dRCA-PLA 80=-90%, EF 55-65%  . PUD (peptic ulcer disease)     PMH , ? 1995  . Anginal pain (HCC)   . Heart murmur   . Shortness of breath   . Short-term memory loss   . GERD (gastroesophageal reflux disease)   . Arthritis     BACK & SHOULDERS   BP 124/78 mmHg  Pulse 77  Resp 14  SpO2 92%  Opioid Risk Score:   Fall Risk Score:  `1  Depression screen PHQ 2/9  Depression screen [facility] 2/9 12/14/2015 10/09/2015 09/18/2015 10/19/2014  Decreased Interest 0 0 0 1  Down, Depressed, Hopeless 0 0 0 1  PHQ - 2 Score 0 0 0 2  Altered sleeping 0 - - 0  Tired, decreased energy 1 - - 3  Change in appetite 0 - - 0  Feeling bad or failure about yourself  0 - - 0  Trouble concentrating 1 - - 0  Moving slowly or fidgety/restless 1 - - 1  Suicidal thoughts 0 - - 0  PHQ-9 Score 3 - - 6  Difficult doing work/chores - - - Somewhat difficult   Review of Systems  HENT: Negative.   Eyes: Negative.   Respiratory: Negative.   Cardiovascular: Negative.   Gastrointestinal: Negative.   Genitourinary: Negative.   Musculoskeletal: Positive for myalgias.  Skin: Positive for rash.  Allergic/Immunologic: Negative.   Neurological: Positive for tremors, weakness and numbness.  Hematological: Negative.   Psychiatric/Behavioral: The patient is nervous/anxious.   All other systems reviewed and are negative.     Objective:   Physical Exam Gen: NAD. Vital signs reviewed HENT: Normocephalic, Atraumatic Eyes: EOMI, Conj WNL Cardio: S1, S2 normal, RRR Pulm: B/l clear to auscultation.  Effort normal Abd: Soft, non-distended, non-tender, BS+ MSK:  Gait Varus deformity of knees, kyphotic.   No TTP.    Mild edema left lateral elbow  Neg  Phalen's, Durkin's, Tinnel's b/l median and ulnar wrist and ulnar elbow  ?Mild fluctuance at lateral left elbow  Negative "OK" sign  Negative forment's test Neuro: CN II-XII grossly intact.    Sensation intact to light touch in all UE dermatomes  Reflexes 2+ throughout  Strength  5/5 in all  UE myotomes Skin: Warm and Dry    Assessment & Plan:  81 y/o male with pmh of OA of back and shoulder, lumbar and cervical radiculopathy presents for numbness in 4th digits of left hand.    1. Left 4th and 5th digit numbness, possibly secondary to cervical radiculopathy  Pt had NCS/EMG suggesting chronic left-sided radiucolopathy C 6-7 and T1 and at L 4-5 and S1.  Encouraged pool therapy  Will order xray of left elbow  Will order Voltaren gel  Will consider ordering brace of ulnar wrist after xray results  2. Kyphosis  Able to correct to come degree with forced hyperextension  Will refer to PT for core strengthening and postural realignment  3. Abnormality of gait  See #2

## 2016-01-10 ENCOUNTER — Encounter: Payer: Self-pay | Admitting: Neurology

## 2016-01-10 ENCOUNTER — Ambulatory Visit (INDEPENDENT_AMBULATORY_CARE_PROVIDER_SITE_OTHER): Payer: Medicare Other | Admitting: Neurology

## 2016-01-10 VITALS — BP 132/80 | HR 78 | Resp 20 | Ht 64.0 in | Wt 153.0 lb

## 2016-01-10 DIAGNOSIS — G4752 REM sleep behavior disorder: Secondary | ICD-10-CM

## 2016-01-10 DIAGNOSIS — G478 Other sleep disorders: Secondary | ICD-10-CM | POA: Diagnosis not present

## 2016-01-10 DIAGNOSIS — G3184 Mild cognitive impairment, so stated: Secondary | ICD-10-CM

## 2016-01-10 DIAGNOSIS — G4733 Obstructive sleep apnea (adult) (pediatric): Secondary | ICD-10-CM | POA: Diagnosis not present

## 2016-01-10 DIAGNOSIS — Z9989 Dependence on other enabling machines and devices: Principal | ICD-10-CM

## 2016-01-10 MED ORDER — CLONAZEPAM 0.5 MG PO TABS: 0.2500 mg | ORAL_TABLET | Freq: Every day | ORAL | 5 refills | Status: DC

## 2016-01-10 NOTE — Addendum Note (Signed)
Addended by: Melvyn Novas on: 01/10/2016 10:39 AM   Modules accepted: Orders
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
## 2016-01-24 ENCOUNTER — Encounter: Payer: Medicare Other | Attending: Physical Medicine & Rehabilitation | Admitting: Physical Medicine & Rehabilitation

## 2016-01-24 ENCOUNTER — Encounter: Payer: Self-pay | Admitting: Physical Medicine & Rehabilitation

## 2016-01-24 VITALS — BP 148/88 | HR 72

## 2016-01-24 DIAGNOSIS — R413 Other amnesia: Secondary | ICD-10-CM | POA: Insufficient documentation

## 2016-01-24 DIAGNOSIS — F419 Anxiety disorder, unspecified: Secondary | ICD-10-CM | POA: Insufficient documentation

## 2016-01-24 DIAGNOSIS — M5412 Radiculopathy, cervical region: Secondary | ICD-10-CM | POA: Insufficient documentation

## 2016-01-24 DIAGNOSIS — R42 Dizziness and giddiness: Secondary | ICD-10-CM | POA: Insufficient documentation

## 2016-01-24 DIAGNOSIS — K219 Gastro-esophageal reflux disease without esophagitis: Secondary | ICD-10-CM | POA: Insufficient documentation

## 2016-01-24 DIAGNOSIS — Z87891 Personal history of nicotine dependence: Secondary | ICD-10-CM | POA: Insufficient documentation

## 2016-01-24 DIAGNOSIS — M8580 Other specified disorders of bone density and structure, unspecified site: Secondary | ICD-10-CM | POA: Insufficient documentation

## 2016-01-24 DIAGNOSIS — M25522 Pain in left elbow: Secondary | ICD-10-CM

## 2016-01-24 DIAGNOSIS — R251 Tremor, unspecified: Secondary | ICD-10-CM | POA: Insufficient documentation

## 2016-01-24 DIAGNOSIS — M1990 Unspecified osteoarthritis, unspecified site: Secondary | ICD-10-CM | POA: Insufficient documentation

## 2016-01-24 DIAGNOSIS — Z8711 Personal history of peptic ulcer disease: Secondary | ICD-10-CM | POA: Insufficient documentation

## 2016-01-24 DIAGNOSIS — E785 Hyperlipidemia, unspecified: Secondary | ICD-10-CM | POA: Insufficient documentation

## 2016-01-24 DIAGNOSIS — M40209 Unspecified kyphosis, site unspecified: Secondary | ICD-10-CM | POA: Insufficient documentation

## 2016-01-24 DIAGNOSIS — Z981 Arthrodesis status: Secondary | ICD-10-CM | POA: Insufficient documentation

## 2016-01-24 DIAGNOSIS — R0602 Shortness of breath: Secondary | ICD-10-CM | POA: Insufficient documentation

## 2016-01-24 DIAGNOSIS — R531 Weakness: Secondary | ICD-10-CM | POA: Insufficient documentation

## 2016-01-24 DIAGNOSIS — I10 Essential (primary) hypertension: Secondary | ICD-10-CM | POA: Insufficient documentation

## 2016-01-24 DIAGNOSIS — R200 Anesthesia of skin: Secondary | ICD-10-CM | POA: Insufficient documentation

## 2016-01-24 DIAGNOSIS — R269 Unspecified abnormalities of gait and mobility: Secondary | ICD-10-CM | POA: Insufficient documentation

## 2016-01-24 NOTE — Progress Notes (Signed)
Pt left without being evaluated.

## 2016-01-24 NOTE — Progress Notes (Deleted)
Subjective:    Patient ID: [name], male    DOB: 1935-06-12, 81 y.o.   MRN: [id number]  HPI  81 y/o male with pmh of OA of back and shoulder, lumbar and cervical radiculopathy presents for numbness in 4th digits of left hand.  This started 4-5 months ago, stable.  He cannot identify alleviating factors.  Driving makes it worse.  He has similar symptoms on his later 2 digits of left foot. He has associated elbow pain. It is constant.   He also complains of kyphosis, which is getting progressively worse.    Pain Inventory Average Pain 4 Pain Right Now 4 My pain is intermittent, dull and tingling  In the last 24 hours, has pain interfered with the following? General activity 3 Relation with others 4 Enjoyment of life 5 What TIME of day is your pain at its worst? NA Sleep (in general) Good  Pain is worse with: some activites Pain improves with: pacing activities Relief from Meds: NA  Mobility walk without assistance how many minutes can you walk? 10 min ability to climb steps?  yes do you drive?  yes  Function retired I need assistance with the following:  household duties  Neuro/Psych weakness numbness  Prior Studies Any changes since last visit?  no  Physicians involved in your care Any changes since last visit?  no   Family History  Problem Relation Age of Onset  . Cancer Father     ? primary  . Uterine cancer Daughter   . Hypertension Maternal Grandmother   . Peripheral vascular disease Maternal Grandmother   . Osteoporosis Sister   . Diabetes Sister   . Stroke Neg Hx   . Heart attack Neg Hx   . Colon cancer Neg Hx   . Prostate cancer Neg Hx    Social History   Social History  . Marital status: Married    Spouse name: [name]  . Number of children: 2  . Years of education: Bachelors   Occupational History  . retired  Retired   Social History Main Topics  . Smoking status: Former Smoker    Quit date: 02/25/1979  . Smokeless tobacco:  Never Used     Comment: smoked 1955-1980, up to 1 ppd  . Alcohol use Yes     Comment: 1/2 cup / day of wine  . Drug use: No  . Sexual activity: Not Asked   Other Topics Concern  . 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   Patient has two adult children- daughters x 2  ([name], [place])   Patient is retired.   Patient has a Bachelor's degree.   Patient is right-handed.   Patient drinks 1/2 liter of green tea daily.   Walks once week for exercise   Born in [place]    Past Surgical History:  Procedure Laterality Date  . CERVICAL FUSION  1997   Dr Jeral Fruit  . CHOLECYSTECTOMY  2004  . COLONOSCOPY     Dr Victorino Dike  . CORONARY ARTERY BYPASS GRAFT  2003   Last catheterization was in August 2008 demonstrated a LIMA to th LAD which was patent, there was an atretic saphenous vein graft to the diagonal, the LAD had a 90% stenosis in the large calcified segment, the diagonal has ostial 70% stenosis, the circumflex had a ramus intermediate with ostial 25% stenosis, the right coronary artery was dominant.  There  was an ulcerated 80%-90% stenosis.    . CORONARY STENT PLACEMENT  2008   drug-eluting stent to right coronary artery  . CORONARY STENT PLACEMENT Right 12/12/2013   RT CORONARY  DES       DR COOPER   . HEMORRHOID SURGERY    . LEFT HEART CATHETERIZATION WITH CORONARY/GRAFT ANGIOGRAM N/A 12/12/2013   Procedure: LEFT HEART CATHETERIZATION WITH Isabel Caprice;  Surgeon: Micheline Chapman, MD;  Location[facility] CATH LAB;  Service: Cardiovascular;  Laterality: N/A;  . TONSILLECTOMY AND ADENOIDECTOMY    . UPPER GASTROINTESTINAL ENDOSCOPY      Dr Victorino Dike   Past Medical History:  Diagnosis Date  . Anginal pain (HCC)   . Arthritis    BACK & SHOULDERS  . CAD S/P percutaneous coronary angioplasty 2003   Dr Antoine Poche: 2003: a) s/p CABG (LIMA-LAD, SVG-Diag);b) 2008: prior PCI to native RCA; c) 12/12/2013: SVG-Diag occluded,  patent LIMA, 95% post stent in RCA --> PCI  with 2.75 x 18 mm Xience Alpine DES, residual dRCA-PLA 80=-90%, EF 55-65%  . Diverticulosis   . GERD (gastroesophageal reflux disease)   . Gilbert's syndrome   . Heart murmur   . Hyperlipidemia   . Hypertension   . Osteopenia 2010    T score -1.9 @ hip (femoral neck)  . PUD (peptic ulcer disease)    PMH , ? 1995  . Short-term memory loss   . Shortness of breath    BP (!) 148/88   Pulse 72   SpO2 96%   Opioid Risk Score:   Fall Risk Score:  `1  Depression screen PHQ 2/9  Depression screen [facility] 2/9 12/14/2015 10/09/2015 09/18/2015 10/19/2014  Decreased Interest 0 0 0 1  Down, Depressed, Hopeless 0 0 0 1  PHQ - 2 Score 0 0 0 2  Altered sleeping 0 - - 0  Tired, decreased energy 1 - - 3  Change in appetite 0 - - 0  Feeling bad or failure about yourself  0 - - 0  Trouble concentrating 1 - - 0  Moving slowly or fidgety/restless 1 - - 1  Suicidal thoughts 0 - - 0  PHQ-9 Score 3 - - 6  Difficult doing work/chores - - - Somewhat difficult   Review of Systems  HENT: Negative.   Eyes: Negative.   Respiratory: Positive for apnea and shortness of breath.   Cardiovascular: Negative.   Gastrointestinal: Positive for constipation.  Genitourinary: Negative.   Musculoskeletal: Positive for myalgias.  Skin: Positive for rash.  Allergic/Immunologic: Negative.   Neurological: Positive for weakness and numbness.  Hematological: Negative.   Psychiatric/Behavioral: The patient is nervous/anxious.   All other systems reviewed and are negative.     Objective:   Physical Exam Gen: NAD. Vital signs reviewed HENT: Normocephalic, Atraumatic Eyes: EOMI, Conj WNL Cardio: S1, S2 normal, RRR Pulm: B/l clear to auscultation.  Effort normal Abd: Soft, non-distended, non-tender, BS+ MSK:  Gait Varus deformity of knees, kyphotic.   No TTP.    Mild edema left lateral elbow  Neg Phalen's, Durkin's, Tinnel's b/l median and ulnar wrist and ulnar elbow  ?Mild  fluctuance at lateral left elbow  Negative "OK" sign  Negative forment's test Neuro: CN II-XII grossly intact.    Sensation intact to light touch in all UE dermatomes  Reflexes 2+ throughout  Strength  5/5 in all UE myotomes Skin: Warm and Dry    Assessment & Plan:  81 y/o male with pmh of OA of back and  shoulder, lumbar and cervical radiculopathy presents for numbness in 4th digits of left hand.    1. Left 4th and 5th digit numbness, possibly secondary to cervical radiculopathy  Pt had NCS/EMG suggesting chronic left-sided radiucolopathy C 6-7 and T1 and at L 4-5 and S1.  Encouraged pool therapy  Will order xray of left elbow  Will order Voltaren gel  Will consider ordering brace of ulnar wrist after xray results  2. Kyphosis  Able to correct to come degree with forced hyperextension  Will refer to PT for core strengthening and postural realignment  3. Abnormality of gait  See #2

## 2016-01-31 ENCOUNTER — Encounter (HOSPITAL_BASED_OUTPATIENT_CLINIC_OR_DEPARTMENT_OTHER): Payer: Medicare Other | Admitting: Physical Medicine & Rehabilitation

## 2016-01-31 ENCOUNTER — Encounter: Payer: Self-pay | Admitting: Physical Medicine & Rehabilitation

## 2016-01-31 VITALS — BP 112/72 | HR 84 | Resp 14

## 2016-01-31 DIAGNOSIS — R200 Anesthesia of skin: Secondary | ICD-10-CM | POA: Diagnosis present

## 2016-01-31 DIAGNOSIS — R42 Dizziness and giddiness: Secondary | ICD-10-CM | POA: Diagnosis not present

## 2016-01-31 DIAGNOSIS — R0602 Shortness of breath: Secondary | ICD-10-CM | POA: Diagnosis not present

## 2016-01-31 DIAGNOSIS — Z981 Arthrodesis status: Secondary | ICD-10-CM | POA: Diagnosis not present

## 2016-01-31 DIAGNOSIS — R413 Other amnesia: Secondary | ICD-10-CM | POA: Diagnosis not present

## 2016-01-31 DIAGNOSIS — M25512 Pain in left shoulder: Secondary | ICD-10-CM

## 2016-01-31 DIAGNOSIS — R251 Tremor, unspecified: Secondary | ICD-10-CM | POA: Diagnosis not present

## 2016-01-31 DIAGNOSIS — M8580 Other specified disorders of bone density and structure, unspecified site: Secondary | ICD-10-CM | POA: Diagnosis not present

## 2016-01-31 DIAGNOSIS — Z8711 Personal history of peptic ulcer disease: Secondary | ICD-10-CM | POA: Diagnosis not present

## 2016-01-31 DIAGNOSIS — M4005 Postural kyphosis, thoracolumbar region: Secondary | ICD-10-CM | POA: Diagnosis not present

## 2016-01-31 DIAGNOSIS — R269 Unspecified abnormalities of gait and mobility: Secondary | ICD-10-CM

## 2016-01-31 DIAGNOSIS — IMO0001 Reserved for inherently not codable concepts without codable children: Secondary | ICD-10-CM

## 2016-01-31 DIAGNOSIS — M791 Myalgia: Secondary | ICD-10-CM | POA: Diagnosis not present

## 2016-01-31 DIAGNOSIS — M609 Myositis, unspecified: Secondary | ICD-10-CM

## 2016-01-31 DIAGNOSIS — F419 Anxiety disorder, unspecified: Secondary | ICD-10-CM | POA: Diagnosis not present

## 2016-01-31 DIAGNOSIS — M40209 Unspecified kyphosis, site unspecified: Secondary | ICD-10-CM | POA: Diagnosis not present

## 2016-01-31 DIAGNOSIS — M1990 Unspecified osteoarthritis, unspecified site: Secondary | ICD-10-CM | POA: Diagnosis not present

## 2016-01-31 DIAGNOSIS — R531 Weakness: Secondary | ICD-10-CM | POA: Diagnosis not present

## 2016-01-31 DIAGNOSIS — Z87891 Personal history of nicotine dependence: Secondary | ICD-10-CM | POA: Diagnosis not present

## 2016-01-31 DIAGNOSIS — M5412 Radiculopathy, cervical region: Secondary | ICD-10-CM

## 2016-01-31 DIAGNOSIS — R208 Other disturbances of skin sensation: Secondary | ICD-10-CM

## 2016-01-31 DIAGNOSIS — K219 Gastro-esophageal reflux disease without esophagitis: Secondary | ICD-10-CM | POA: Diagnosis not present

## 2016-01-31 DIAGNOSIS — E785 Hyperlipidemia, unspecified: Secondary | ICD-10-CM | POA: Diagnosis not present

## 2016-01-31 DIAGNOSIS — I10 Essential (primary) hypertension: Secondary | ICD-10-CM | POA: Diagnosis not present

## 2016-01-31 NOTE — Progress Notes (Addendum)
Subjective:    Patient ID: [name], male    DOB: February 19, 1935, 81 y.o.   MRN: [id number]  HPI  81 y/o male with pmh of OA of back and shoulder, lumbar and cervical radiculopathy presents for numbness in 4th digits of left hand.  This started ~Feb 2017, stable.  He cannot identify alleviating factors.  Driving makes it worse.  He has similar symptoms on his later 2 digits of left foot. He has associated elbow pain. It is intermittent.   He also complains of kyphosis, which is getting progressively worse.    Last clinic visit 6/30. He has been participating in pool therapy, which has been helping.  He did not end up getting an xray of his elbow.  He used the voltaren gel, but it did not help.  PT was ordered, but he did not go.   Today, he mainly complains of shoulder pain with shoulder extension and numbness of his 4th and 5th digits.   Pain Inventory Average Pain 0 Pain Right Now 0 My pain is aching  In the last 24 hours, has pain interfered with the following? General activity 2 Relation with others 2 Enjoyment of life 2 What TIME of day is your pain at its worst? morning Sleep (in general) Good  Pain is worse with: walking, standing and driving Pain improves with: nothing Relief from Meds: 0  Mobility walk without assistance how many minutes can you walk? 10 ability to climb steps?  yes do you drive?  yes transfers alone  Function retired I need assistance with the following:  household duties Do you have any goals in this area?  yes  Neuro/Psych weakness numbness tremor trouble walking dizziness anxiety loss of taste or smell  Prior Studies Any changes since last visit?  no nerve study  Physicians involved in your care Any changes since last visit?  no   Family History  Problem Relation Age of Onset  . Cancer Father     ? primary  . Uterine cancer Daughter   . Hypertension Maternal Grandmother   . Peripheral vascular disease Maternal  Grandmother   . Osteoporosis Sister   . Diabetes Sister   . Stroke Neg Hx   . Heart attack Neg Hx   . Colon cancer Neg Hx   . Prostate cancer Neg Hx    Social History   Social History  . Marital status: Married    Spouse name: [name]  . Number of children: 2  . Years of education: Bachelors   Occupational History  . retired  Retired   Social History Main Topics  . Smoking status: Former Smoker    Quit date: 02/25/1979  . Smokeless tobacco: Never Used     Comment: smoked 1955-1980, up to 1 ppd  . Alcohol use Yes     Comment: 1/2 cup / day of wine  . Drug use: No  . Sexual activity: Not Asked   Other Topics Concern  . 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   Patient has two adult children- daughters x 2  ([name], [name])   Patient is retired.   Patient has a Bachelor's degree.   Patient is right-handed.   Patient drinks 1/2 liter of green tea daily.   Walks once week for exercise   [place]    Past Surgical History:  Procedure Laterality Date  . CERVICAL FUSION  1997  Dr Jeral Fruit  . CHOLECYSTECTOMY  2004  . COLONOSCOPY     Dr Victorino Dike  . CORONARY ARTERY BYPASS GRAFT  2003   Last catheterization was in August 2008 demonstrated a LIMA to th LAD which was patent, there was an atretic saphenous vein graft to the diagonal, the LAD had a 90% stenosis in the large calcified segment, the diagonal has ostial 70% stenosis, the circumflex had a ramus intermediate with ostial 25% stenosis, the right coronary artery was dominant.  There was an ulcerated 80%-90% stenosis.    . CORONARY STENT PLACEMENT  2008   drug-eluting stent to right coronary artery  . CORONARY STENT PLACEMENT Right 12/12/2013   RT CORONARY  DES       DR COOPER   . HEMORRHOID SURGERY    . LEFT HEART CATHETERIZATION WITH CORONARY/GRAFT ANGIOGRAM N/A 12/12/2013   Procedure: LEFT HEART CATHETERIZATION WITH Isabel Caprice;  Surgeon:  Micheline Chapman, MD;  Location: [facility] C[facility] LAB;  Service: Cardiovascular;  Laterality: N/A;  . TONSILLECTOMY AND ADENOIDECTOMY    . UPPER GASTROINTESTINAL ENDOSCOPY      Dr Victorino Dike   Past Medical History:  Diagnosis Date  . Anginal pain (HCC)   . Arthritis    BACK & SHOULDERS  . CAD S/P percutaneous coronary angioplasty 2003   Dr Antoine Poche: 2003: a) s/p CABG (LIMA-LAD, SVG-Diag);b) 2008: prior PCI to native RCA; c) 12/12/2013: SVG-Diag occluded, patent LIMA, 95% post stent in RCA --> PCI  with 2.75 x 18 mm Xience Alpine DES, residual dRCA-PLA 80=-90%, EF 55-65%  . Diverticulosis   . GERD (gastroesophageal reflux disease)   . Gilbert's syndrome   . Heart murmur   . Hyperlipidemia   . Hypertension   . Osteopenia 2010    T score -1.9 @ hip (femoral neck)  . PUD (peptic ulcer disease)    PMH , ? 1995  . Short-term memory loss   . Shortness of breath    BP 112/72 (BP Location: Right Arm, Patient Position: Sitting, Cuff Size: Normal)   Pulse 84   Resp 14   SpO2 92%   Opioid Risk Score:   Fall Risk Score:  `1  Depression screen PHQ 2/9  Depression screen [facility] 2/9 12/14/2015 10/09/2015 09/18/2015 10/19/2014  Decreased Interest 0 0 0 1  Down, Depressed, Hopeless 0 0 0 1  PHQ - 2 Score 0 0 0 2  Altered sleeping 0 - - 0  Tired, decreased energy 1 - - 3  Change in appetite 0 - - 0  Feeling bad or failure about yourself  0 - - 0  Trouble concentrating 1 - - 0  Moving slowly or fidgety/restless 1 - - 1  Suicidal thoughts 0 - - 0  PHQ-9 Score 3 - - 6  Difficult doing work/chores - - - Somewhat difficult   Review of Systems  HENT: Negative.   Eyes: Negative.   Respiratory: Negative.   Cardiovascular: Negative.   Gastrointestinal: Negative.   Genitourinary: Negative.   Musculoskeletal: Positive for myalgias.  Skin: Positive for rash.  Allergic/Immunologic: Negative.   Neurological: Positive for weakness and numbness.  Hematological: Negative.   Psychiatric/Behavioral: The patient  is nervous/anxious.   All other systems reviewed and are negative.     Objective:   Physical Exam Gen: NAD. Vital signs reviewed HENT: Normocephalic, Atraumatic Eyes: EOMI, Conj WNL Cardio: S1, S2 normal, RRR Pulm: B/l clear to auscultation.  Effort normal Abd: Soft, non-distended, non-tender, BS+ MSK:  Gait Varus deformity  of knees, kyphotic.   No TTP.    No edema.  Atrophy of DI b/l  Neg Phalen's, Durkin's, Tinnel's b/l median and ulnar wrist and ulnar elbow  ?Mild fluctuance at lateral left elbow  Negative "OK" sign  Negative forment's test Neuro: CN II-XII grossly intact.    Sensation intact to light touch in all UE dermatomes  Reflexes 2+ throughout  Strength  5/5 in all UE myotomes Skin: Warm and Dry    Assessment & Plan:  81 y/o male with pmh of OA of back and shoulder, lumbar and cervical radiculopathy presents for numbness in 4th digits of left hand.    1. Left 4th and 5th digit numbness, possibly secondary to cervical radiculopathy  Pt had NCS/EMG suggesting chronic left-sided radiucolopathy C 6-7 and T1 and at L 4-5 and S1.  D/c Voltaren gel due to lack of efficacy  Cont pool therapy  Will encourage xray of left elbow (not performed)  Will order brace of ulnar wrist after xray results  Pt does not want medications at this time  2. Kyphosis  Able to correct to come degree with forced hyperextension  Refered to PT for core strengthening and postural realignment  3. Abnormality of gait  See #2  4. Left shoulder pain  Xray ordered  5. Myalgia  Will consider trigger point injections based on xray findings

## 2016-02-01 ENCOUNTER — Telehealth: Payer: Self-pay | Admitting: Physical Medicine & Rehabilitation

## 2016-02-01 NOTE — Telephone Encounter (Signed)
Carollee HerterShannon with [facility] needs clarification about Xrays that were ordered for patient.  Please call her at [phone].

## 2016-02-01 NOTE — Telephone Encounter (Signed)
Cervical spine complete is what needs to be ordered and Dr Allena KatzPatel in agreement.  They will correct.

## 2016-02-05 ENCOUNTER — Ambulatory Visit (HOSPITAL_BASED_OUTPATIENT_CLINIC_OR_DEPARTMENT_OTHER)
Admission: RE | Admit: 2016-02-05 | Discharge: 2016-02-05 | Disposition: A | Discharge status: Home / Self Care | Payer: Medicare Other | Source: Ambulatory Visit | Attending: Physical Medicine & Rehabilitation | Admitting: Physical Medicine & Rehabilitation

## 2016-02-05 DIAGNOSIS — M4005 Postural kyphosis, thoracolumbar region: Secondary | ICD-10-CM | POA: Diagnosis not present

## 2016-02-05 DIAGNOSIS — M47892 Other spondylosis, cervical region: Secondary | ICD-10-CM | POA: Insufficient documentation

## 2016-02-05 DIAGNOSIS — M5412 Radiculopathy, cervical region: Secondary | ICD-10-CM | POA: Insufficient documentation

## 2016-02-05 DIAGNOSIS — I998 Other disorder of circulatory system: Secondary | ICD-10-CM | POA: Diagnosis not present

## 2016-02-06 ENCOUNTER — Encounter: Payer: Self-pay | Admitting: Gastroenterology

## 2016-02-07 ENCOUNTER — Telehealth: Payer: Self-pay | Admitting: Physical Medicine & Rehabilitation

## 2016-02-07 NOTE — Telephone Encounter (Signed)
Pt wanted direction to help him look for a brace. Ad[facility] to [facility]. Provided address and phone number of [facility] to pt.

## 2016-02-07 NOTE — Telephone Encounter (Signed)
Patient has a question about Ulnar Brace that was ordered for him.  Please call patient at [phone].

## 2016-02-19 ENCOUNTER — Encounter: Payer: Self-pay | Admitting: Internal Medicine

## 2016-02-19 ENCOUNTER — Ambulatory Visit (INDEPENDENT_AMBULATORY_CARE_PROVIDER_SITE_OTHER): Payer: Medicare Other | Admitting: Internal Medicine

## 2016-02-19 VITALS — BP 118/78 | HR 71 | Temp 97.6°F | Resp 14 | Ht 64.0 in | Wt 154.1 lb

## 2016-02-19 DIAGNOSIS — R5383 Other fatigue: Secondary | ICD-10-CM

## 2016-02-19 DIAGNOSIS — Z23 Encounter for immunization: Secondary | ICD-10-CM

## 2016-02-19 DIAGNOSIS — E785 Hyperlipidemia, unspecified: Secondary | ICD-10-CM | POA: Diagnosis not present

## 2016-02-19 DIAGNOSIS — M8580 Other specified disorders of bone density and structure, unspecified site: Secondary | ICD-10-CM

## 2016-02-19 DIAGNOSIS — M40209 Unspecified kyphosis, site unspecified: Secondary | ICD-10-CM | POA: Diagnosis not present

## 2016-02-19 DIAGNOSIS — N5089 Other specified disorders of the male genital organs: Secondary | ICD-10-CM

## 2016-02-19 NOTE — Progress Notes (Signed)
Pre visit review using our clinic review tool, if applicable. No additional management support is needed unless otherwise documented below in the visit note. 

## 2016-02-19 NOTE — Progress Notes (Signed)
Subjective:    Patient ID: [name], male    DOB: [birth year], 81 y.o.   MRN: [id number]  DOS:  02/19/2016 Type of visit - description : Routine checkup Interval history: Since the last visit, he has seen physical therapy regards MSK issues . He remains active. CAD: Note from cardiology reviewed Good medication compliance without apparent side effects. Labs reviewed, not due for any blood work today    Review of Systems  Denies chest pain, DOE at baseline Fatigued at baseline No nausea, vomiting, diarrhea Past Medical History:  Diagnosis Date  . Anginal pain (HCC)   . Arthritis    BACK & SHOULDERS  . CAD S/P percutaneous coronary angioplasty 2003   Dr Antoine Poche: 2003: a) s/p CABG (LIMA-LAD, SVG-Diag);b) 2008: prior PCI to native RCA; c) 12/12/2013: SVG-Diag occluded, patent LIMA, 95% post stent in RCA --> PCI  with 2.75 x 18 mm Xience Alpine DES, residual dRCA-PLA 80=-90%, EF 55-65%  . Diverticulosis   . GERD (gastroesophageal reflux disease)   . Gilbert's syndrome   . Heart murmur   . Hyperlipidemia   . Hypertension   . Osteopenia 2010    T score -1.9 @ hip (femoral neck)  . PUD (peptic ulcer disease)    PMH , ? 1995  . Short-term memory loss   . Shortness of breath     Past Surgical History:  Procedure Laterality Date  . CERVICAL FUSION  1997   Dr Jeral Fruit  . CHOLECYSTECTOMY  2004  . CORONARY ARTERY BYPASS GRAFT  2003   Last catheterization was in August 2008 demonstrated a LIMA to th LAD which was patent, there was an atretic saphenous vein graft to the diagonal, the LAD had a 90% stenosis in the large calcified segment, the diagonal has ostial 70% stenosis, the circumflex had a ramus intermediate with ostial 25% stenosis, the right coronary artery was dominant.  There was an ulcerated 80%-90% stenosis.    . CORONARY STENT PLACEMENT  2008   drug-eluting stent to right coronary artery  . CORONARY STENT PLACEMENT Right 12/12/2013   RT CORONARY  DES       DR  COOPER   . HEMORRHOID SURGERY    . LEFT HEART CATHETERIZATION WITH CORONARY/GRAFT ANGIOGRAM N/A 12/12/2013   Procedure: LEFT HEART CATHETERIZATION WITH Isabel Caprice;  Surgeon: Micheline Chapman, MD;  Location: [facility] CATH LAB;  Service: Cardiovascular;  Laterality: N/A;  . TONSILLECTOMY AND ADENOIDECTOMY    . UPPER GASTROINTESTINAL ENDOSCOPY      Dr Victorino Dike    Social History   Social History  . Marital status: Married    Spouse name: [name]  . Number of children: 2  . Years of education: Bachelors   Occupational History  . retired  Retired   Social History Main Topics  . Smoking status: Former Smoker    Quit date: 02/25/1979  . Smokeless tobacco: Never Used     Comment: smoked 1955-1980, up to 1 ppd  . Alcohol use Yes     Comment: 1/2 cup / day of wine  . Drug use: No  . Sexual activity: Not on file   Other Topics Concern  . Not on file   Social History Narrative   Patient is married ([place]) and lives at home with his wife.   Patient has two adult children- daughters x 2  ([name], [place])   Patient is retired.   Patient has a Bachelor's degree.   Patient is right-handed.   Patient  drinks 1/2 liter of green tea daily.   Walks once week for exercise   [place]         Medication List       Accurate as of 02/19/16  5:58 PM. Always use your most recent med list.          aspirin EC 81 MG tablet Take 81 mg by mouth daily.   clonazePAM 0.5 MG tablet Commonly known as:  KLONOPIN Take 0.5 tablets (0.25 mg total) by mouth at bedtime. Prn  For REM BD and can be used for Vertigo.   diphenhydrAMINE 25 MG tablet Commonly known as:  SOMINEX Take 25 mg by mouth at bedtime as needed for allergies.   fluticasone 27.5 MCG/SPRAY nasal spray Commonly known as:  VERAMYST Place 1 spray into both nostrils daily. Reported on 09/18/2015   MULTIVITAMIN ADULT PO Take 0.5 tablets by mouth daily.   nitroGLYCERIN 0.4 MG SL tablet Commonly known as:   NITROSTAT Place 1 tablet (0.4 mg total) under the tongue every 5 (five) minutes as needed for chest pain.   rosuvastatin 20 MG tablet Commonly known as:  CRESTOR TAKE 1 TABLET (20 MG TOTAL) BY MOUTH AT BEDTIME.          Objective:   Physical Exam BP 118/78 (BP Location: Left Arm, Patient Position: Sitting, Cuff Size: Normal)   Pulse 71   Temp 97.6 F (36.4 C) (Oral)   Resp 14   Ht 5\' 4"  (1.626 m)   Wt 154 lb 2 oz (69.9 kg)   SpO2 96%   BMI 26.46 kg/m   General:   Well developed, well nourished . NAD.  HEENT:  Normocephalic . Face symmetric, atraumatic Lungs:  CTA B Normal respiratory effort, no intercostal retractions, no accessory muscle use. Heart: RRR,  no murmur.  No pretibial edema bilaterally  MSK: Prominent kyphosis noted. Hands with bony enlargement consistent with OA   Skin: Exposed areas without rash. Not pale. Not jaundice Neurologic:  alert & oriented X3.  Speech normal, gait appropriate for age but difficult  by kyphosis, unassisted Strength symmetric and appropriate for age.  Psych: Cognition and judgment appear intact.  Cooperative with normal attention span and concentration.  Behavior appropriate. No anxious or depressed appearing.     Assessment & Plan:  Assessment HTN Hyperlipidemia CV: dr [place] --CAD, CABG 2003, stent 2008, cath 2015 [facility](PCI-RCA), dc plavix 02-2015 --Aortic regurgitation, mild mod OSA  --- REM sleep d/o ---> CPAP. Clonazepam qhs prn Vertigo  MSK --DJD --Severe kyphosis ---NCS 4-2017chronic radiucolopathy left C 6-7 and T1 and at L 4-5 and S1, saw neuro, rx conservative treatment  ---Osteopenia, nl vit D 2013 GERD, h/o PUD Short term memory impairment    PLAN Hyperlipidemia: On Crestor, under excellent control CAD: Note from cardiology reviewed, stable. Continue Crestor and aspirin MSK: Kyphosis >>  seen by physical medicine, feeling about the same, encouraged to continue staying active. Fatigue: At  baseline Osteopenia: Check a bone density test which was not done since the last visit. Primary care: Flu shot today RTC 6 months

## 2016-02-19 NOTE — Patient Instructions (Signed)
  GO TO THE FRONT DESK Schedule your next appointment for a routine checkup in 5-6 months  

## 2016-02-19 NOTE — Assessment & Plan Note (Signed)
Hyperlipidemia: On Crestor, under excellent control CAD: Note from cardiology reviewed, stable. Continue Crestor and aspirin MSK: Kyphosis >>  seen by physical medicine, feeling about the same, encouraged to continue staying active. Fatigue: At baseline Osteopenia: Check a bone density test which was not done since the last visit. Primary care: Flu shot today RTC 6 months

## 2016-02-26 ENCOUNTER — Ambulatory Visit: Payer: Medicare Other | Admitting: Internal Medicine

## 2016-03-17 ENCOUNTER — Telehealth: Payer: Self-pay | Admitting: Neurology

## 2016-03-17 ENCOUNTER — Other Ambulatory Visit: Payer: Self-pay | Admitting: Cardiology

## 2016-03-17 NOTE — Telephone Encounter (Signed)
Clonazepam was just refilled on 01/10/16 for 6 months. Pt needs to call his [facility]. I called pt to advise him of this. No answer, left a message asking him tocall me back. If pt calls back, please ask him to call his [facility] pharmacy and discuss. [facility] will contact us if a new RX is truly needed.

## 2016-03-17 NOTE — Telephone Encounter (Signed)
[facility] requesting a refill on the clonazepam. I called [facility], spoke to Delice Bisonara, tech, and she reports that the last RX they have on file was written on 07/25/2015 and there is no remaining refills. Will send refill request for Dr. Vickey Hugerohmeier to review.

## 2016-03-17 NOTE — Addendum Note (Signed)
Addended by: Geronimo RunningINKINS, Maxi Carreras A on: 03/17/2016 05:08 PM   Modules accepted: Orders

## 2016-03-17 NOTE — Telephone Encounter (Signed)
REFILL 

## 2016-03-17 NOTE — Telephone Encounter (Signed)
Pt request refill for clonazePAM (KLONOPIN) 0.5 MG tablet . He was advised [facility] had rx dated 01/10/16. Pt relayed the his bottle states 2 refills before 01/21/16. Pt call the pt

## 2016-03-18 MED ORDER — CLONAZEPAM 0.5 MG PO TABS: 0.2500 mg | ORAL_TABLET | Freq: Every day | ORAL | 5 refills | Status: DC

## 2016-03-18 NOTE — Telephone Encounter (Signed)
RX for clonazepam faxed to [facility]. Received a receipt of confirmation.

## 2016-03-20 NOTE — Telephone Encounter (Signed)
Pt returned Kristen's call. Msg relayed, pt understood and was appreciative

## 2016-03-21 ENCOUNTER — Ambulatory Visit: Payer: Medicare Other | Admitting: Physical Medicine & Rehabilitation

## 2016-04-25 ENCOUNTER — Other Ambulatory Visit (HOSPITAL_BASED_OUTPATIENT_CLINIC_OR_DEPARTMENT_OTHER): Payer: Medicare Other

## 2016-04-28 ENCOUNTER — Ambulatory Visit (HOSPITAL_BASED_OUTPATIENT_CLINIC_OR_DEPARTMENT_OTHER)
Admission: RE | Admit: 2016-04-28 | Discharge: 2016-04-28 | Disposition: A | Discharge status: Home / Self Care | Payer: Medicare Other | Source: Ambulatory Visit | Attending: Internal Medicine | Admitting: Internal Medicine

## 2016-04-28 DIAGNOSIS — N539 Unspecified male sexual dysfunction: Secondary | ICD-10-CM | POA: Diagnosis not present

## 2016-04-28 DIAGNOSIS — Z13820 Encounter for screening for osteoporosis: Secondary | ICD-10-CM | POA: Diagnosis present

## 2016-05-21 ENCOUNTER — Ambulatory Visit: Payer: Medicare Other | Admitting: Cardiology

## 2016-06-17 NOTE — Progress Notes (Signed)
[name]  History of Present Illness: [name] is seen for follow up CAD. He is s/p CABG in 2003 by Dr. Cornelius Moraswen with LIMA to the LAD and SVG to the Diagonal. By cath in 2008 he had a high grade proximal RCA stenosis and had stenting with a 2.75 mm Promus stent by Dr. Juanda ChanceBrodie. He was noted to have an atretic SVG to the diagonal but it was felt that antegrade flow into the diagonal was OK. His last stress Myoview in November 2012 showed a fixed apical defect without ischemia and EF 62%.  In May 2015 he presented with unstable angina. Cardiac cath showed patent LIMA to the LAD and occlusion of SVG to the diagonal. There was a severe stenosis in the RCA which was stented with a DES. He also had an Echo in 9/15 for a murmur and has AV sclerosis with mild to moderate AI.   On follow up today he has multiple complaints. He notes he gets tired easily. He is limited by bilateral pain in back and hips. His legs are weak. Some SOB with exertion- this has not changed. He was trying to do water aerobics but his wife has been ill so he hasn't been able to exercise much.  Only chest pain is a sudden stabbing pain in his sternum that is brief and postitional.  When he has to reach overhead his arms tire easily. None of these symptoms are like his angina.   Allergies as of 06/20/2016      Reactions   Codeine Nausea Only      Medication List       Accurate as of 06/20/16  9:50 AM. Always use your most recent med list.          aspirin EC 81 MG tablet Take 81 mg by mouth daily.   clonazePAM 0.5 MG tablet Commonly known as:  KLONOPIN Take 0.5 tablets (0.25 mg total) by mouth at bedtime. Prn  For REM BD and can be used for Vertigo.   diphenhydrAMINE 25 MG tablet Commonly known as:  SOMINEX Take 25 mg by mouth at bedtime as needed for allergies.   fluticasone 27.5 MCG/SPRAY nasal spray Commonly known as:  VERAMYST Place 1 spray into both  nostrils daily. Reported on 09/18/2015   MULTIVITAMIN ADULT PO Take 0.5 tablets by mouth daily.   nitroGLYCERIN 0.4 MG SL tablet Commonly known as:  NITROSTAT Place 1 tablet (0.4 mg total) under the tongue every 5 (five) minutes as needed for chest pain.   rosuvastatin 20 MG tablet Commonly known as:  CRESTOR TAKE 1 TABLET (20 MG TOTAL) BY MOUTH AT BEDTIME.        Allergies  Allergen Reactions  . Codeine Nausea Only    Past Medical History:  Diagnosis Date  . Anginal pain (HCC)   . Arthritis    BACK & SHOULDERS  . CAD S/P percutaneous coronary angioplasty 2003   Dr Antoine PocheHochrein: 2003: a) s/p CABG (LIMA-LAD, SVG-Diag);b) 2008: prior PCI to native RCA; c) 12/12/2013: SVG-Diag occluded, patent LIMA, 95% post stent in RCA --> PCI  with 2.75 x 18 mm Xience Alpine DES, residual dRCA-PLA 80=-90%, EF 55-65%  . Diverticulosis   . GERD (gastroesophageal reflux disease)   . Gilbert's syndrome   . Heart murmur   . Hyperlipidemia   . Hypertension   . Osteopenia 2010    T score -1.9 @ hip (femoral neck)  . PUD (peptic  ulcer disease)    PMH , ? 1995  . Short-term memory loss   . Shortness of breath     Past Surgical History:  Procedure Laterality Date  . CERVICAL FUSION  1997   Dr Jeral Fruit  . CHOLECYSTECTOMY  2004  . CORONARY ARTERY BYPASS GRAFT  2003   Last catheterization was in August 2008 demonstrated a LIMA to th LAD which was patent, there was an atretic saphenous vein graft to the diagonal, the LAD had a 90% stenosis in the large calcified segment, the diagonal has ostial 70% stenosis, the circumflex had a ramus intermediate with ostial 25% stenosis, the right coronary artery was dominant.  There was an ulcerated 80%-90% stenosis.    . CORONARY STENT PLACEMENT  2008   drug-eluting stent to right coronary artery  . CORONARY STENT PLACEMENT Right 12/12/2013   RT CORONARY  DES       DR COOPER   . HEMORRHOID SURGERY    . LEFT HEART CATHETERIZATION WITH CORONARY/GRAFT ANGIOGRAM N/A  12/12/2013   Procedure: LEFT HEART CATHETERIZATION WITH Isabel Caprice;  Surgeon: Micheline Chapman, MD;  Location: [facility] CATH LAB;  Service: Cardiovascular;  Laterality: N/A;  . TONSILLECTOMY AND ADENOIDECTOMY    . UPPER GASTROINTESTINAL ENDOSCOPY      Dr Victorino Dike    Social History   Social History  . Marital status: Married    Spouse name: [name]  . Number of children: 2  . Years of education: Bachelors   Occupational History  . retired  Retired   Social History Main Topics  . Smoking status: Former Smoker    Quit date: 02/25/1979  . Smokeless tobacco: Never Used     Comment: smoked 1955-1980, up to 1 ppd  . Alcohol use Yes     Comment: 1/2 cup / day of wine  . Drug use: No  . Sexual activity: Not Asked   Other Topics Concern  . 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   Patient has two adult children- daughters x 2  ([name], [place])   Patient is retired.   Patient has a Bachelor's degree.   Patient is right-handed.   Patient drinks 1/2 liter of green tea daily.   Walks once week for exercise   Born in [place]     Family History  Problem Relation Age of Onset  . Cancer Father     ? primary  . Uterine cancer Daughter   . Hypertension Maternal Grandmother   . Peripheral vascular disease Maternal Grandmother   . Osteoporosis Sister   . Diabetes Sister   . Stroke Neg Hx   . Heart attack Neg Hx   . Colon cancer Neg Hx   . Prostate cancer Neg Hx     Review of Systems: As noted in HPI.  All other systems were reviewed and are negative.  Physical Exam: BP 123/67 (BP Location: Right Arm)   Pulse 77   Ht 5\' 4"  (1.626 m)   Wt 155 lb 6.4 oz (70.5 kg)   BMI 26.67 kg/m  Filed Weights   06/20/16 0935  Weight: 155 lb 6.4 oz (70.5 kg)  GENERAL:  Well appearing WM in NAD. HEENT:  PERRL, EOMI, sclera are clear. Oropharynx is clear. NECK:  No jugular venous distention, carotid upstroke brisk and  symmetric, no bruits, no thyromegaly or adenopathy LUNGS:  Clear to auscultation bilaterally CHEST:  Unremarkable HEART:  RRR,  PMI not displaced or sustained,S1 and S2 within normal limits, Grade 2/6 harsh systolic murmur at the apex with soft diastolic murmur at the LSB.  ABD:  Soft, nontender. BS +, no masses or bruits. No hepatomegaly, no splenomegaly Spine: kyphotic. EXT:  2 + pulses throughout, no edema, no cyanosis no clubbing SKIN:  Warm and dry.  No rashes NEURO:  Alert and oriented x 3. Cranial nerves II through XII intact. PSYCH:  Cognitively intact    LABORATORY DATA:  Lab Results  Component Value Date   WBC 8.1 09/18/2015   HGB 15.1 09/18/2015   HCT 43.9 09/18/2015   PLT 199.0 09/18/2015   GLUCOSE 98 09/18/2015   CHOL 142 10/23/2015   TRIG 83.0 10/23/2015   HDL 55.20 10/23/2015   LDLCALC 70 10/23/2015   ALT 20 10/23/2015   AST 19 10/23/2015   NA 135 09/18/2015   K 4.6 09/18/2015   CL 99 09/18/2015   CREATININE 0.83 09/18/2015   BUN 14 09/18/2015   CO2 29 09/18/2015   TSH 1.69 10/24/2014   PSA 0.98 06/04/2006   INR 0.98 01/15/2014   HGBA1C 5.6 10/26/2014   Ecg today shows NSR with normal Ecg. I have personally reviewed and interpreted this study.  Assessment / Plan: 1. CAD. Known CAD s/p CABG in 2003 and subsequent stenting of native RCA 2008. DES of the proximal RCA in June 2015.  Continue ASA. No significant anginal symptoms.   2. Mild to moderate AI.  Normal LV size and function. Asymptomatic. Will follow every 4-5 years unless status changes.   3. Hyperlipidemia- on statin. Well controlled.   4. Fatigue. No improvement with stopping metoprolol. I suspect he has significant deconditioning. Encourage him to try and get more exercise.  5. Back/hip pain.secondary to kyphosis.  6. Dyspnea- chronic.   Follow up in 6 months.

## 2016-06-20 ENCOUNTER — Encounter: Payer: Self-pay | Admitting: Cardiology

## 2016-06-20 ENCOUNTER — Ambulatory Visit (INDEPENDENT_AMBULATORY_CARE_PROVIDER_SITE_OTHER): Payer: Medicare Other | Admitting: Cardiology

## 2016-06-20 VITALS — BP 123/67 | HR 77 | Ht 64.0 in | Wt 155.4 lb

## 2016-06-20 DIAGNOSIS — R0600 Dyspnea, unspecified: Secondary | ICD-10-CM

## 2016-06-20 DIAGNOSIS — I351 Nonrheumatic aortic (valve) insufficiency: Secondary | ICD-10-CM | POA: Diagnosis not present

## 2016-06-20 DIAGNOSIS — I2510 Atherosclerotic heart disease of native coronary artery without angina pectoris: Secondary | ICD-10-CM

## 2016-06-20 DIAGNOSIS — Z9861 Coronary angioplasty status: Secondary | ICD-10-CM | POA: Diagnosis not present

## 2016-06-20 DIAGNOSIS — R0609 Other forms of dyspnea: Secondary | ICD-10-CM

## 2016-06-20 DIAGNOSIS — I10 Essential (primary) hypertension: Secondary | ICD-10-CM | POA: Diagnosis not present

## 2016-06-20 DIAGNOSIS — E782 Mixed hyperlipidemia: Secondary | ICD-10-CM

## 2016-06-20 NOTE — Patient Instructions (Signed)
Continue same medications.   Your physician wants you to follow-up in: 6 months.  You will receive a reminder letter in the mail two months in advance. If you don't receive a letter, please call our office to schedule the follow-up appointment.  

## 2016-06-30 ENCOUNTER — Other Ambulatory Visit: Payer: Self-pay | Admitting: Cardiology

## 2016-06-30 NOTE — Telephone Encounter (Signed)
Rx(s) sent to pharmacy electronically.  

## 2016-07-04 ENCOUNTER — Encounter: Payer: Self-pay | Admitting: Family Medicine

## 2016-07-04 ENCOUNTER — Ambulatory Visit (INDEPENDENT_AMBULATORY_CARE_PROVIDER_SITE_OTHER): Payer: Medicare Other | Admitting: Family Medicine

## 2016-07-04 DIAGNOSIS — S4992XA Unspecified injury of left shoulder and upper arm, initial encounter: Secondary | ICD-10-CM

## 2016-07-04 MED ORDER — NITROGLYCERIN 0.2 MG/HR TD PT24: MEDICATED_PATCH | TRANSDERMAL | 1 refills | Status: DC

## 2016-07-04 MED ORDER — MELOXICAM 15 MG PO TABS: 15.0000 mg | ORAL_TABLET | Freq: Every day | ORAL | 2 refills | Status: DC

## 2016-07-04 NOTE — Patient Instructions (Signed)
You have strained your rotator cuff. Try to avoid painful activities (overhead activities, lifting with extended arm) as much as possible. Meloxicam 15mg  daily with food for pain and inflammation. Can take tylenol in addition to this. I wouldn't recommend injection for this. Consider physical therapy with transition to home exercise program. Do home exercise program with theraband and scapular stabilization exercises daily - these are very important for long term relief.  3 sets of 10 once a day. Nitro patches 1/4th patch to affected shoulder, change daily. If not improving at follow-up we will consider further imaging, physical therapy. Follow up with me in 6 weeks.

## 2016-07-07 DIAGNOSIS — S4992XD Unspecified injury of left shoulder and upper arm, subsequent encounter: Secondary | ICD-10-CM | POA: Insufficient documentation

## 2016-07-07 NOTE — Assessment & Plan Note (Signed)
independently reviewed radiographs and no evidence fracture.  Exam reassuring, consistent with rotator cuff strain, no evidence full thickness tear.  Start with meloxicam, home exercises, nitro patches (discussed risks of headache, skin irritation).  Consider further imaging (u/s or MRI) physical therapy if not improving.  F/u in 6 weeks.
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
## 2016-07-09 ENCOUNTER — Telehealth: Payer: Self-pay | Admitting: Family Medicine

## 2016-07-09 NOTE — Telephone Encounter (Signed)
Yes, I'm fine with that.

## 2016-07-09 NOTE — Telephone Encounter (Signed)
You can reiterate this to him though Gunnar Fusiaula told him also - I cannot look at the images except the [facility]  [facility] IT department blocked the software saying it's possible they contain a virus.

## 2016-07-09 NOTE — Telephone Encounter (Signed)
Correct, no charge 

## 2016-07-10 ENCOUNTER — Ambulatory Visit: Payer: Medicare Other | Admitting: Family Medicine

## 2016-07-14 ENCOUNTER — Ambulatory Visit (INDEPENDENT_AMBULATORY_CARE_PROVIDER_SITE_OTHER): Payer: Medicare Other | Admitting: Neurology

## 2016-07-14 ENCOUNTER — Encounter: Payer: Self-pay | Admitting: Neurology

## 2016-07-14 VITALS — BP 148/78 | HR 78 | Resp 20 | Ht 63.0 in | Wt 157.0 lb

## 2016-07-14 DIAGNOSIS — G4733 Obstructive sleep apnea (adult) (pediatric): Secondary | ICD-10-CM | POA: Diagnosis not present

## 2016-07-14 DIAGNOSIS — R200 Anesthesia of skin: Secondary | ICD-10-CM | POA: Diagnosis not present

## 2016-07-14 DIAGNOSIS — G3184 Mild cognitive impairment, so stated: Secondary | ICD-10-CM | POA: Diagnosis not present

## 2016-07-14 DIAGNOSIS — R202 Paresthesia of skin: Secondary | ICD-10-CM

## 2016-07-14 MED ORDER — CLONAZEPAM 0.5 MG PO TABS: 0.2500 mg | ORAL_TABLET | Freq: Every day | ORAL | 5 refills | Status: DC

## 2016-07-14 MED ORDER — MELATONIN 3 MG PO TABS: ORAL_TABLET | ORAL | 0 refills | Status: DC

## 2016-07-14 NOTE — Patient Instructions (Signed)
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
## 2016-08-11 ENCOUNTER — Encounter: Payer: Self-pay | Admitting: *Deleted

## 2016-08-11 ENCOUNTER — Ambulatory Visit (INDEPENDENT_AMBULATORY_CARE_PROVIDER_SITE_OTHER): Payer: Medicare Other | Admitting: *Deleted

## 2016-08-11 VITALS — BP 122/72 | HR 82 | Resp 16 | Ht 64.0 in | Wt 157.8 lb

## 2016-08-11 DIAGNOSIS — Z0000 Encounter for general adult medical examination without abnormal findings: Secondary | ICD-10-CM

## 2016-08-11 NOTE — Progress Notes (Addendum)
Subjective:   [name] is a 82 y.o. male who presents for Medicare Annual/Subsequent preventive examination.  Review of Systems:  No ROS.  Medicare Wellness Visit.  Sleep patterns: Pt sleeps 8 hrs/night. States he feels rested when he wakes up. Does not get up to use restroom.   Home Safety/Smoke Alarms:  Feels safe in home. Smoke alarms in place.   Living environment; residence and Firearm Safety: 3 story home. Forearms stored safely. Seat Belt Safety/Bike Helmet: Wears seat belt.   Counseling:   Eye Exam- 2 years ago. No change in vision per pt.  Dental-Full dentures. Brushes everyday.    Male:   CCS-  05/04/2006 - Normal.    PSA-  Lab Results  Component Value Date   PSA 0.98 06/04/2006    Cardiac Risk Factors include: hypertension;dyslipidemia;male gender;advanced age (>87men, >26 women);sedentary lifestyle     Objective:    Vitals: BP 122/72 (BP Location: Left Arm, Patient Position: Sitting, Cuff Size: Normal)   Pulse 82   Resp 16   Ht 5\' 4"  (1.626 m)   Wt 157 lb 12.8 oz (71.6 kg)   SpO2 98%   BMI 27.09 kg/m   Body mass index is 27.09 kg/m.  Tobacco History  Smoking Status  . Former Smoker  . Quit date: 02/25/1979  Smokeless Tobacco  . Never Used    Comment: smoked 1955-1980, up to 1 ppd     Counseling given: Not Answered   Past Medical History:  Diagnosis Date  . Anginal pain (HCC)   . Arthritis    BACK & SHOULDERS  . CAD S/P percutaneous coronary angioplasty 2003   Dr Antoine Poche: 2003: a) s/p CABG (LIMA-LAD, SVG-Diag);b) 2008: prior PCI to native RCA; c) 12/12/2013: SVG-Diag occluded, patent LIMA, 95% post stent in RCA --> PCI  with 2.75 x 18 mm Xience Alpine DES, residual dRCA-PLA 80=-90%, EF 55-65%  . Diverticulosis   . GERD (gastroesophageal reflux disease)   . Gilbert's syndrome   . Heart murmur   . Hyperlipidemia   . Hypertension   . Osteopenia 2010    T score -1.9 @ hip (femoral neck)  . PUD (peptic ulcer disease)    PMH , ?  1995  . Short-term memory loss   . Shortness of breath    Past Surgical History:  Procedure Laterality Date  . CERVICAL FUSION  1997   Dr Jeral Fruit  . CHOLECYSTECTOMY  2004  . CORONARY ARTERY BYPASS GRAFT  2003   Last catheterization was in August 2008 demonstrated a LIMA to th LAD which was patent, there was an atretic saphenous vein graft to the diagonal, the LAD had a 90% stenosis in the large calcified segment, the diagonal has ostial 70% stenosis, the circumflex had a ramus intermediate with ostial 25% stenosis, the right coronary artery was dominant.  There was an ulcerated 80%-90% stenosis.    . CORONARY STENT PLACEMENT  2008   drug-eluting stent to right coronary artery  . CORONARY STENT PLACEMENT Right 12/12/2013   RT CORONARY  DES       DR COOPER   . HEMORRHOID SURGERY    . LEFT HEART CATHETERIZATION WITH CORONARY/GRAFT ANGIOGRAM N/A 12/12/2013   Procedure: LEFT HEART CATHETERIZATION WITH Isabel Caprice;  Surgeon: Micheline Chapman, MD;  Location: [facility] CATH LAB;  Service: Cardiovascular;  Laterality: N/A;  . TONSILLECTOMY AND ADENOIDECTOMY    . UPPER GASTROINTESTINAL ENDOSCOPY      Dr Victorino Dike   Family History  Problem  Relation Age of Onset  . Cancer Father     ? primary  . Uterine cancer Daughter   . Hypertension Maternal Grandmother   . Peripheral vascular disease Maternal Grandmother   . Osteoporosis Sister   . Diabetes Sister   . Stroke Neg Hx   . Heart attack Neg Hx   . Colon cancer Neg Hx   . Prostate cancer Neg Hx    History  Sexual Activity  . Sexual activity: No    Outpatient Encounter Prescriptions as of 08/11/2016  Medication Sig  . aspirin EC 81 MG tablet Take 81 mg by mouth daily.  . clonazePAM (KLONOPIN) 0.5 MG tablet Take 0.5 tablets (0.25 mg total) by mouth at bedtime. Prn  For REM BD and can be used for Vertigo.  . diphenhydrAMINE (SOMINEX) 25 MG tablet Take 25 mg by mouth at bedtime as needed for allergies.  . fluticasone (VERAMYST) 27.5  MCG/SPRAY nasal spray Place 1 spray into both nostrils daily. Reported on 09/18/2015  . Multiple Vitamins-Minerals (MULTIVITAMIN ADULT PO) Take 1 tablet by mouth daily.   . nitroGLYCERIN (NITRODUR - DOSED IN MG/24 HR) 0.2 mg/hr patch Apply 1/4th patch to affected shoulder, change daily  . nitroGLYCERIN (NITROSTAT) 0.4 MG SL tablet Place 1 tablet (0.4 mg total) under the tongue every 5 (five) minutes as needed for chest pain.  . rosuvastatin (CRESTOR) 20 MG tablet TAKE 1 TABLET (20 MG TOTAL) BY MOUTH AT BEDTIME.  . Melatonin 3 MG TABS One  30 minutes prior to bedtime. (Patient not taking: Reported on 08/11/2016)   No facility-administered encounter medications on file as of 08/11/2016.     Activities of Daily Living In your present state of health, do you have any difficulty performing the following activities: 08/11/2016 02/19/2016  Hearing? N N  Vision? N N  Difficulty concentrating or making decisions? Y N  Walking or climbing stairs? N N  Dressing or bathing? N N  Doing errands, shopping? N N  Preparing Food and eating ? N -  Using the Toilet? N -  In the past six months, have you accidently leaked urine? N -  Do you have problems with loss of bowel control? N -  Managing your Medications? N -  Managing your Finances? N -  Housekeeping or managing your Housekeeping? N -  Some recent data might be hidden    Patient Care Team: [name], MD as PCP - General (Internal Medicine) Melvyn Novas, MD as Consulting Physician (Neurology) Peter M Swaz[place], MD as Consulting Physician (Cardiology)   Assessment:    Physical assessment deferred to PCP.  Exercise Activities and Dietary recommendations Current Exercise Habits: Home exercise routine (Workouts from Ortho dr. ), Type of exercise: strength training/weights, Time (Minutes): 10, Frequency (Times/Week): 7, Weekly Exercise (Minutes/Week): 70, Intensity: Mild  Diet (meal preparation, eat out, water intake, caffeinated beverages, dairy  products, fruits and vegetables): Drinks 1 large bottled water per day.  Breakfast: Fruit and cereal, coffee somedays Lunch: [place] restaurant or sandwich Dinner:    Pasta with meat   Goals    . Increase exercise          Go to the [facility] daily.      Fall Risk Fall Risk  08/11/2016 07/04/2016 02/19/2016 01/31/2016 01/24/2016  Falls in the past year? No No No No No  Risk for fall due to : - Other (Comment) - - -   Depression Screen PHQ 2/9 Scores 08/11/2016 07/04/2016 02/19/2016 12/14/2015  PHQ - 2  Score 2 0 0 0  PHQ- 9 Score 4 - - 3  Exception Documentation - Other- indicate reason in comment box - -    Cognitive Function MMSE - Mini Mental State Exam 08/11/2016 07/14/2016 10/09/2015  Orientation to time 5 4 5   Orientation to Place 5 5 5   Registration 3 3 3   Attention/ Calculation 5 4 1   Recall 2 2 3   Language- name 2 objects 2 2 2   Language- repeat 1 0 0  Language- follow 3 step command 3 3 3   Language- read & follow direction 1 1 1   Write a sentence 1 1 1   Copy design 1 1 1   Total score 29 26 25    Montreal Cognitive Assessment  01/10/2016 01/10/2016 05/30/2015 05/30/2015  Visuospatial/ Executive (0/5) 4 4 3 5   Naming (0/3) 3 3 3 3   Attention: Read list of digits (0/2) 2 2 2 2   Attention: Read list of letters (0/1) 1 1 1 1   Attention: Serial 7 subtraction starting at 100 (0/3) 3 3 3 3   Language: Repeat phrase (0/2) 0 0 1 2  Language : Fluency (0/1) 0 0 0 1  Abstraction (0/2) 2 2 1 2   Delayed Recall (0/5) 2 2 3 5   Orientation (0/6) 6 6 6 6   Total 23 23 23 30   Adjusted Score (based on education) - 23 - 30      Immunization History  Administered Date(s) Administered  . Influenza Split 03/02/2012  . Influenza Whole 04/27/2007, 03/29/2008, 04/25/2009  . Influenza, High Dose Seasonal PF 02/19/2016  . Influenza,inj,Quad PF,36+ Mos 04/10/2014, 08/31/2015  . Pneumococcal Conjugate-13 10/19/2014  . Pneumococcal Polysaccharide-23 10/23/2015  . Td 10/23/2015   Screening  Tests Health Maintenance  Topic Date Due  . TETANUS/TDAP  10/22/2025  . INFLUENZA VACCINE  Completed  . PNA vac Low Risk Adult  Completed      Plan:    Start doing brain stimulating activities (puzzles, reading, adult coloring books, staying active) to keep memory sharp.   Bring a copy of your advance directives to your next office visit.   During the course of the visit the patient was educated and counseled about the following appropriate screening and preventive services:   Vaccines to include Pneumoccal, Influenza, Hepatitis B, Td, Zostavax, HCV  Cardiovascular Disease  Colorectal cancer screening  - pt will come in to discuss his options  Prostate Cancer Screening   Glaucoma screening  Nutrition counseling   Patient Instructions (the written plan) was given to the patient.    Richelle ItoCassandra Albright, RN  08/11/2016  Willow OraJose Paz, MD

## 2016-08-11 NOTE — Patient Instructions (Signed)
Start doing brain stimulating activities (puzzles, reading, adult coloring books, staying active) to keep memory sharp.   Bring a copy of your advance directives to your next office visit.  Follow up with PCP as directed.

## 2016-08-15 ENCOUNTER — Encounter: Payer: Self-pay | Admitting: Family Medicine

## 2016-08-15 ENCOUNTER — Ambulatory Visit (INDEPENDENT_AMBULATORY_CARE_PROVIDER_SITE_OTHER): Payer: Medicare Other | Admitting: Family Medicine

## 2016-08-15 DIAGNOSIS — S4992XD Unspecified injury of left shoulder and upper arm, subsequent encounter: Secondary | ICD-10-CM | POA: Diagnosis not present

## 2016-08-15 NOTE — Patient Instructions (Signed)
You are doing very well. Try to avoid painful activities (overhead activities, lifting with extended arm) as much as possible. Meloxicam 15mg  daily with food for pain and inflammation only if needed. Can take tylenol in addition to this. I wouldn't recommend injection for this. Consider physical therapy with transition to home exercise program in the future. Continue home exercise program with theraband and scapular stabilization exercises daily - these are very important for long term relief.  3 sets of 10 once a day. Nitro patches 1/4th patch to affected shoulder, change daily - use for another 6 weeks. If not improving at follow-up we will consider further imaging, physical therapy. Follow up with me in 6 weeks.

## 2016-08-16 NOTE — Assessment & Plan Note (Signed)
Radiographs were negative for fracture.  Clinically improving.  Consistent with rotator cuff strain, no evidence full thickness tear.  He will continue with home exercises, nitro patches.  Will consider physical therapy, further imaging if not improving.  F/u in 6 weeks.
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
## 2016-08-18 ENCOUNTER — Ambulatory Visit (INDEPENDENT_AMBULATORY_CARE_PROVIDER_SITE_OTHER): Payer: Medicare Other | Admitting: Internal Medicine

## 2016-08-18 ENCOUNTER — Encounter: Payer: Self-pay | Admitting: Internal Medicine

## 2016-08-18 VITALS — BP 124/70 | HR 76 | Temp 98.2°F | Resp 14 | Ht 64.0 in | Wt 159.2 lb

## 2016-08-18 DIAGNOSIS — R413 Other amnesia: Secondary | ICD-10-CM | POA: Diagnosis not present

## 2016-08-18 DIAGNOSIS — M150 Primary generalized (osteo)arthritis: Secondary | ICD-10-CM | POA: Diagnosis not present

## 2016-08-18 DIAGNOSIS — Z0000 Encounter for general adult medical examination without abnormal findings: Secondary | ICD-10-CM

## 2016-08-18 DIAGNOSIS — M159 Polyosteoarthritis, unspecified: Secondary | ICD-10-CM

## 2016-08-18 DIAGNOSIS — M8949 Other hypertrophic osteoarthropathy, multiple sites: Secondary | ICD-10-CM

## 2016-08-18 NOTE — Progress Notes (Signed)
Subjective:    Patient ID: [name], male    DOB: 08-16-1934, 82 y.o.   MRN: [id number]  DOS:  08/18/2016 Type of visit - description : rov Interval history:  Few concerns  During the  wellness exam, he was not sure if he liked to do a colonoscopy or not. Likes to discuss with me Continue with back pain, associated with "heavy legs" when he walks more than 60 yards and if he stands for long time. Symptoms are unchanged. Concerned about his memory, no major problems but he is forgetful.  Review of Systems Denies nausea, vomiting, diarrhea. No blood in the stools. Occasional constipation, better with MiraLAX prn  Past Medical History:  Diagnosis Date  . Anginal pain (HCC)   . Arthritis    BACK & SHOULDERS  . CAD S/P percutaneous coronary angioplasty 2003   Dr Antoine PocheHochrein: 2003: a) s/p CABG (LIMA-LAD, SVG-Diag);b) 2008: prior PCI to native RCA; c) 12/12/2013: SVG-Diag occluded, patent LIMA, 95% post stent in RCA --> PCI  with 2.75 x 18 mm Xience Alpine DES, residual dRCA-PLA 80=-90%, EF 55-65%  . Diverticulosis   . GERD (gastroesophageal reflux disease)   . Gilbert's syndrome   . Heart murmur   . Hyperlipidemia   . Hypertension   . Osteopenia 2010    T score -1.9 @ hip (femoral neck)  . PUD (peptic ulcer disease)    PMH , ? 1995  . Short-term memory loss   . Shortness of breath     Past Surgical History:  Procedure Laterality Date  . CERVICAL FUSION  1997   Dr Jeral FruitBotero  . CHOLECYSTECTOMY  2004  . CORONARY ARTERY BYPASS GRAFT  2003   Last catheterization was in August 2008 demonstrated a LIMA to th LAD which was patent, there was an atretic saphenous vein graft to the diagonal, the LAD had a 90% stenosis in the large calcified segment, the diagonal has ostial 70% stenosis, the circumflex had a ramus intermediate with ostial 25% stenosis, the right coronary artery was dominant.  There was an ulcerated 80%-90% stenosis.    . CORONARY STENT PLACEMENT  2008   drug-eluting stent  to right coronary artery  . CORONARY STENT PLACEMENT Right 12/12/2013   RT CORONARY  DES       DR COOPER   . HEMORRHOID SURGERY    . LEFT HEART CATHETERIZATION WITH CORONARY/GRAFT ANGIOGRAM N/A 12/12/2013   Procedure: LEFT HEART CATHETERIZATION WITH Isabel CapriceORONARY/GRAFT ANGIOGRAM;  Surgeon: Micheline ChapmanMichael D Cooper, MD;  [facility];  Service: Cardiovascular;  Laterality: N/A;  . TONSILLECTOMY AND ADENOIDECTOMY    . UPPER GASTROINTESTINAL ENDOSCOPY      Dr Victorino DikeSam Shallowater    Social History   Social History  . Marital status: Married    Spouse name: [name]  . Number of children: 2  . Years of education: Bachelors   Occupational History  . retired  Retired   Social History Main Topics  . Smoking status: Former Smoker    Quit date: 02/25/1979  . Smokeless tobacco: Never Used     Comment: smoked 1955-1980, up to 1 ppd  . Alcohol use Yes     Comment: 1/2 cup / day of wine or 1 can beer/day  . Drug use: No  . Sexual activity: No   Other Topics Concern  . Not on file   Social History Narrative   Patient is married ([place]anca) and lives at home with his wife.   Patient has two adult  children- daughters x 2  ([name], [place])   Patient is retired.   Patient has a Bachelor's degree.   Patient is right-handed.   Patient drinks 1/2 liter of green tea daily.   Walks once week for exercise   Born in [place]       Allergies as of 08/18/2016      Reactions   Codeine Nausea Only      Medication List       Accurate as of 08/18/16 11:59 PM. Always use your most recent med list.          aspirin EC 81 MG tablet Take 81 mg by mouth daily.   clonazePAM 0.5 MG tablet Commonly known as:  KLONOPIN Take 0.5 tablets (0.25 mg total) by mouth at bedtime. Prn  For REM BD and can be used for Vertigo.   diphenhydrAMINE 25 MG tablet Commonly known as:  SOMINEX Take 25 mg by mouth at bedtime as needed for sleep.   fluticasone 27.5 MCG/SPRAY nasal spray Commonly known as:  VERAMYST Place 1  spray into both nostrils daily. Reported on 09/18/2015   MULTIVITAMIN ADULT PO Take 1 tablet by mouth daily.   nitroGLYCERIN 0.4 MG SL tablet Commonly known as:  NITROSTAT Place 1 tablet (0.4 mg total) under the tongue every 5 (five) minutes as needed for chest pain.   nitroGLYCERIN 0.2 mg/hr patch Commonly known as:  NITRODUR - Dosed in mg/24 hr Apply 1/4th patch to affected shoulder, change daily   rosuvastatin 20 MG tablet Commonly known as:  CRESTOR TAKE 1 TABLET (20 MG TOTAL) BY MOUTH AT BEDTIME.          Objective:   Physical Exam BP 124/70 (BP Location: Left Arm, Patient Position: Sitting, Cuff Size: Small)   Pulse 76   Temp 98.2 F (36.8 C) (Oral)   Resp 14   Ht 5\' 4"  (1.626 m)   Wt 159 lb 4 oz (72.2 kg)   SpO2 96%   BMI 27.34 kg/m  General:   Well developed, well nourished . NAD.  HEENT:  Normocephalic . Face symmetric, atraumatic Lungs:  CTA B Normal respiratory effort, no intercostal retractions, no accessory muscle use. Heart: RRR,  + systolic murmur.  No pretibial edema bilaterally  Skin: Not pale. Not jaundice Neurologic:  alert & oriented X3.  Speech normal, gaisomewhat limited by kyphosis pych--  Cognition and judgment appear intact.  Cooperative with normal attention span and concentration.  Behavior appropriate. No anxious or depressed appearing.      Assessment & Plan:   Assessment HTN Hyperlipidemia CV: dr Sw[place]il[place] --CAD, CABG 2003, stent 2008, cath 2015 [facility]), dc plavix 02-2015 --Aortic regurgitation, mild mod OSA  --- REM sleep d/o ---> CPAP. 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 He copes with forgetfulness very well. Recommend observation for now DJD: Back pain at baseline RTC 3 months, CPX

## 2016-08-18 NOTE — Patient Instructions (Signed)
Next visit in 3 moths , physical exam, fasting

## 2016-08-18 NOTE — Progress Notes (Signed)
Pre visit review using our clinic review tool, if applicable. No additional management support is needed unless otherwise documented below in the visit note. 

## 2016-08-19 NOTE — Assessment & Plan Note (Signed)
Colon cancer screening: Normal colonoscopy 2007, we had a discussion about the pros and cons of colonoscopy versus iFOB, elected iFOB Mild memory impairment: He seems to be forgetful but MMSE recently was 29 which is essentially normal. He copes with forgetfulness very well. Recommend observation for now DJD: Back pain at baseline RTC 3 months, CPX

## 2016-08-29 ENCOUNTER — Other Ambulatory Visit (INDEPENDENT_AMBULATORY_CARE_PROVIDER_SITE_OTHER): Payer: Medicare Other

## 2016-08-29 DIAGNOSIS — Z0000 Encounter for general adult medical examination without abnormal findings: Secondary | ICD-10-CM

## 2016-08-29 LAB — FECAL OCCULT BLOOD, IMMUNOCHEMICAL: Fecal Occult Bld: NEGATIVE

## 2016-09-21 ENCOUNTER — Other Ambulatory Visit: Payer: Self-pay | Admitting: Neurology

## 2016-09-24 ENCOUNTER — Other Ambulatory Visit: Payer: Self-pay | Admitting: Neurology

## 2016-10-01 ENCOUNTER — Telehealth: Payer: Self-pay | Admitting: *Deleted

## 2016-10-01 ENCOUNTER — Ambulatory Visit (INDEPENDENT_AMBULATORY_CARE_PROVIDER_SITE_OTHER): Payer: Medicare Other | Admitting: Family Medicine

## 2016-10-01 ENCOUNTER — Encounter: Payer: Self-pay | Admitting: Family Medicine

## 2016-10-01 DIAGNOSIS — S4992XD Unspecified injury of left shoulder and upper arm, subsequent encounter: Secondary | ICD-10-CM

## 2016-10-01 NOTE — Telephone Encounter (Signed)
error 

## 2016-10-01 NOTE — Patient Instructions (Signed)
Continue the nitro patches, home exercises for 6 more weeks. Follow up with me then or in 3 months if you are continuing to improve.

## 2016-10-02 ENCOUNTER — Telehealth: Payer: Self-pay

## 2016-10-02 ENCOUNTER — Other Ambulatory Visit: Payer: Self-pay | Admitting: *Deleted

## 2016-10-02 ENCOUNTER — Other Ambulatory Visit: Payer: Self-pay | Admitting: Cardiology

## 2016-10-02 MED ORDER — ROSUVASTATIN CALCIUM 20 MG PO TABS: 20.0000 mg | ORAL_TABLET | Freq: Every day | ORAL | 0 refills | Status: DC

## 2016-10-02 NOTE — Telephone Encounter (Signed)
New Message     *STAT* If patient is at the pharmacy, call can be transferred to refill team.   1. Which medications need to be refilled? (please list name of each medication and dose if known)  rosuvastatin (CRESTOR) 20 MG tablet TAKE 1 TABLET (20 MG TOTAL) BY MOUTH AT BEDTIME.     2. Which pharmacy/location (including street and city if local pharmacy) is medication to be sent to?[facility]  [place]   3. Do they need a 30 day or 90 day supply? 90   Pt is out of medication
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
## 2016-10-02 NOTE — Telephone Encounter (Signed)
REFILL 

## 2016-10-02 NOTE — Assessment & Plan Note (Signed)
Radiographs were negative for fracture.  Clinically improving.  2/2 rotator cuff strain.  Continue home exercises, nitro patches.  Will consider physical therapy, further imaging if not improving.  F/u in 6 weeks - 3 months.

## 2016-10-02 NOTE — Telephone Encounter (Signed)
Request was cancelled because pharmacy ran under wrong insurance. Patient no longer has [employer]

## 2016-10-02 NOTE — Telephone Encounter (Signed)
PA required and started in CoverMyMeds [facility]

## 2017-01-04 ENCOUNTER — Other Ambulatory Visit: Payer: Self-pay | Admitting: Cardiology

## 2017-01-05 NOTE — Telephone Encounter (Signed)
REFILL 

## 2017-01-13 ENCOUNTER — Telehealth: Payer: Self-pay | Admitting: Neurology

## 2017-01-13 DIAGNOSIS — Z9989 Dependence on other enabling machines and devices: Principal | ICD-10-CM

## 2017-01-13 DIAGNOSIS — G4752 REM sleep behavior disorder: Secondary | ICD-10-CM

## 2017-01-13 DIAGNOSIS — G4733 Obstructive sleep apnea (adult) (pediatric): Secondary | ICD-10-CM

## 2017-01-13 MED ORDER — CLONAZEPAM 0.5 MG PO TABS: 0.2500 mg | ORAL_TABLET | Freq: Every day | ORAL | 5 refills | Status: DC

## 2017-01-19 ENCOUNTER — Encounter: Payer: Self-pay | Admitting: Adult Health

## 2017-01-19 ENCOUNTER — Ambulatory Visit (INDEPENDENT_AMBULATORY_CARE_PROVIDER_SITE_OTHER): Payer: Medicare Other | Admitting: Adult Health

## 2017-01-19 VITALS — BP 132/77 | HR 77 | Wt 155.6 lb

## 2017-01-19 DIAGNOSIS — G4733 Obstructive sleep apnea (adult) (pediatric): Secondary | ICD-10-CM | POA: Diagnosis not present

## 2017-01-19 DIAGNOSIS — R413 Other amnesia: Secondary | ICD-10-CM | POA: Diagnosis not present

## 2017-01-19 DIAGNOSIS — Z9989 Dependence on other enabling machines and devices: Secondary | ICD-10-CM
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
## 2017-01-19 NOTE — Patient Instructions (Addendum)
Continue using cpap nightly  If your new mask leaks please let us know Continue Klonopin If your symptoms worsen or you develop new symptoms please let us know.

## 2017-01-20 NOTE — Progress Notes (Signed)
I agree with the assessment and plan as directed by NP .The patient is known to me .   Favour Aleshire, MD  

## 2017-02-11 NOTE — Telephone Encounter (Signed)
No change in settings 

## 2017-03-10 ENCOUNTER — Ambulatory Visit: Payer: Medicare Other | Admitting: Internal Medicine

## 2017-03-25 NOTE — Progress Notes (Signed)
[name] Date of Birth: [birth year] Medical Record #[id number]  History of Present Illness: [name] is seen for follow up CAD. He is s/p CABG in 2003 by Matthew. Cornelius [name] with LIMA to the LAD and SVG to the Diagonal. By cath in 2008 he had a high grade proximal RCA stenosis and had stenting with a 2.75 mm Promus stent. He was noted to have an atretic SVG to the diagonal but it was felt that antegrade flow into the diagonal was OK. His last stress Myoview in November 2012 showed a fixed apical defect without ischemia and EF 62%.  In May 2015 he presented with unstable angina. Cardiac cath showed patent LIMA to the LAD and occlusion of SVG to the diagonal. There was a severe stenosis in the RCA which was stented with a DES. He also had an Echo in 9/15 for a murmur and has AV sclerosis with mild to moderate AI.  He was seen in the ED in [place] in September for evaluation of dyspnea, nausea, hearburn, and vertigo. Evaluation with blood work and Ecg unremarkable. CT chest negative for PE or other acute findings. He was treated for vertigo with resolution.    On follow up today he is doing fairly well. He states he doesn't have much time to exercise since he is caring for his chronically ill wife. He notes occasional pain in sternum if he turns a certain way. This is very sharp and momentary. No dyspnea. No other chest pain. He does have some thoracic back pain.   Allergies as of 03/27/2017      Reactions   Codeine Nausea Only      Medication List       Accurate as of 03/27/17  4:50 PM. Always use your most recent med list.          aspirin EC 81 MG tablet Take 81 mg by mouth daily.   clonazePAM 0.5 MG tablet Commonly known as:  KLONOPIN Take 0.5 tablets (0.25 mg total) by mouth at bedtime. Prn  For REM BD and can be used for Vertigo.   fluticasone 27.5 MCG/SPRAY nasal spray Commonly known as:  VERAMYST Place 1 spray into both nostrils daily. Reported on 09/18/2015     MULTIVITAMIN ADULT PO Take 1 tablet by mouth daily.   nitroGLYCERIN 0.4 MG SL tablet Commonly known as:  NITROSTAT Place 1 tablet (0.4 mg total) under the tongue every 5 (five) minutes as needed for chest pain.   rosuvastatin 20 MG tablet Commonly known as:  CRESTOR Take 1 tablet (20 mg total) by mouth daily.   UNABLE TO FIND Med Name: colon cleaner        Allergies  Allergen Reactions  . Codeine Nausea Only    Past Medical History:  Diagnosis Date  . Anginal pain (HCC)   . Arthritis    BACK & SHOULDERS  . CAD S/P percutaneous coronary angioplasty 2003   [name]: 2003: a) s/p CABG (LIMA-LAD, SVG-Diag);b) 2008: prior PCI to native RCA; c) 12/12/2013: SVG-Diag occluded, patent LIMA, 95% post stent in RCA --> PCI  with 2.75 x 18 mm Xience Alpine DES, residual dRCA-PLA 80=-90%, EF 55-65%  . Diverticulosis   . GERD (gastroesophageal reflux disease)   . Gilbert's syndrome   . Heart murmur   . Hyperlipidemia   . Hypertension   . Osteopenia 2010    T score -1.9 @ hip (femoral neck)  . PUD (peptic ulcer disease)    PMH , ?  1995  . Short-term memory loss   . Shortness of breath     Past Surgical History:  Procedure Laterality Date  . CERVICAL FUSION  1997   [name]  . CHOLECYSTECTOMY  2004  . CORONARY ARTERY BYPASS GRAFT  2003   Last catheterization was in August 2008 demonstrated a LIMA to th LAD which was patent, there was an atretic saphenous vein graft to the diagonal, the LAD had a 90% stenosis in the large calcified segment, the diagonal has ostial 70% stenosis, the circumflex had a ramus intermediate with ostial 25% stenosis, the right coronary artery was dominant.  There was an ulcerated 80%-90% stenosis.    . CORONARY STENT PLACEMENT  2008   drug-eluting stent to right coronary artery  . CORONARY STENT PLACEMENT Right 12/12/2013   RT CORONARY  DES       [name]   . HEMORRHOID SURGERY    . LEFT HEART CATHETERIZATION WITH CORONARY/GRAFT ANGIOGRAM N/A  12/12/2013   Procedure: LEFT HEART CATHETERIZATION WITH [name];  Surgeon: Matthew Chapman, MD;  Location: [facility] CATH LAB;  Service: Cardiovascular;  Laterality: N/A;  . TONSILLECTOMY AND ADENOIDECTOMY    . UPPER GASTROINTESTINAL ENDOSCOPY      [name]    Social History   Social History  . Marital status: Married    Spouse name: [name]  . Number of children: 2  . Years of education: Bachelors   Occupational History  . retired  Retired   Social History Main Topics  . Smoking status: Former Smoker    Quit date: 02/25/1979  . Smokeless tobacco: Never Used     Comment: smoked 1955-1980, up to 1 ppd  . Alcohol use Yes     Comment: 1/2 cup / day of wine or 1 can beer/day  . Drug use: No  . Sexual activity: No   Other Topics Concern  . 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   Patient has two adult children- daughters x 2  ([name], [place])   Patient is retired.   Patient has a Bachelor's degree.   Patient is right-handed.   Patient drinks 1/2 liter of green tea daily.   Walks once week for exercise   Born in [place]     Family History  Problem Relation Age of Onset  . Cancer Father        ? primary  . Uterine cancer Daughter   . Hypertension Maternal Grandmother   . Peripheral vascular disease Maternal Grandmother   . Osteoporosis Sister   . Diabetes Sister   . Stroke Neg Hx   . Heart attack Neg Hx   . Colon cancer Neg Hx   . Prostate cancer Neg Hx     Review of Systems: As noted in HPI.  All other systems were reviewed and are negative.  Physical Exam: BP 124/74   Pulse 80   Temp (!) 96.8 F (36 C)   Ht  (1.6 m)   Wt 155 lb 3.2 oz (70.4 kg)   BMI 27.49 kg/m  Filed Weights   03/27/17 1536  Weight: 155 lb 3.2 oz (70.4 kg)  GENERAL:  Well appearing WM in NAD HEENT:  PERRL, EOMI, sclera are clear. Oropharynx is clear. NECK:  No jugular venous distention, carotid  upstroke brisk and symmetric, no bruits, no thyromegaly or adenopathy LUNGS:  Clear to auscultation bilaterally CHEST:  Unremarkable HEART:  RRR,  PMI not displaced or sustained,S1 and S2 within normal limits, no S3, no S4: no clicks, no rubs, no murmurs ABD:  Soft, nontender. BS +, no masses or bruits. No hepatomegaly, no splenomegaly EXT:  2 + pulses throughout, no edema, no cyanosis no clubbing SKIN:  Warm and dry.  No rashes NEURO:  Alert and oriented x 3. Cranial nerves II through XII intact. PSYCH:  Cognitively intact      LABORATORY DATA:  Lab Results  Component Value Date   WBC 8.1 09/18/2015   HGB 15.1 09/18/2015   HCT 43.9 09/18/2015   PLT 199.0 09/18/2015   GLUCOSE 98 09/18/2015   CHOL 142 10/23/2015   TRIG 83.0 10/23/2015   HDL 55.20 10/23/2015   LDLCALC 70 10/23/2015   ALT 20 10/23/2015   AST 19 10/23/2015   NA 135 09/18/2015   K 4.6 09/18/2015   CL 99 09/18/2015   CREATININE 0.83 09/18/2015   BUN 14 09/18/2015   CO2 29 09/18/2015   TSH 1.69 10/24/2014   PSA 0.98 06/04/2006   INR 0.98 01/15/2014   HGBA1C 5.6 10/26/2014   Labs dated 02/24/17: Normal chemistry, CBC, and troponin  Assessment / Plan: 1. CAD. Known CAD s/p CABG in 2003 and subsequent stenting of native RCA 2008. DES of the proximal RCA in June 2015.  He is asymptomatic. Continue ASA.  2. Mild to moderate AI.  Normal LV size and function. Asymptomatic. Will follow every 4-5 years unless status changes.   3. Hyperlipidemia- on statin. Needs repeat lipid panel. Chemistries are OK.   4. Back/hip pain.secondary to kyphosis.    Follow up in 6 months.

## 2017-03-27 ENCOUNTER — Ambulatory Visit (INDEPENDENT_AMBULATORY_CARE_PROVIDER_SITE_OTHER): Payer: Medicare Other | Admitting: Cardiology

## 2017-03-27 ENCOUNTER — Encounter: Payer: Self-pay | Admitting: Cardiology

## 2017-03-27 VITALS — BP 124/74 | HR 80 | Temp 96.8°F | Ht 63.0 in | Wt 155.2 lb

## 2017-03-27 DIAGNOSIS — E782 Mixed hyperlipidemia: Secondary | ICD-10-CM | POA: Diagnosis not present

## 2017-03-27 DIAGNOSIS — I25810 Atherosclerosis of coronary artery bypass graft(s) without angina pectoris: Secondary | ICD-10-CM

## 2017-03-27 DIAGNOSIS — I351 Nonrheumatic aortic (valve) insufficiency: Secondary | ICD-10-CM

## 2017-03-27 MED ORDER — ROSUVASTATIN CALCIUM 20 MG PO TABS: 20.0000 mg | ORAL_TABLET | Freq: Every day | ORAL | 3 refills | Status: DC

## 2017-03-27 NOTE — Patient Instructions (Signed)
Continue your current therapy  We will check your cholesterol.

## 2017-03-29 LAB — LIPID PANEL W/O CHOL/HDL RATIO
Cholesterol, Total: 140 mg/dL (ref 100–199)
HDL: 59 mg/dL (ref >39)
LDL Calculated: 65 mg/dL (ref 0–99)
TRIGLYCERIDES: 82 mg/dL (ref 0–149)
VLDL Cholesterol Cal: 16 mg/dL (ref 5–40)

## 2017-04-06 ENCOUNTER — Telehealth: Payer: Self-pay | Admitting: Internal Medicine

## 2017-04-06 NOTE — Telephone Encounter (Signed)
Caller name: Lars MageJuan  Relation to pt: Self  Call back number: [phone] Pharmacy:  Reason for call: Pt came in office wanting to know if he can get a rx for [facility] 3 Wheel Rollator with [employer]([facility]) and to let pt know when ready to pick up rx at the office.

## 2017-04-06 NOTE — Telephone Encounter (Signed)
Pt has appt tomorrow w/ PCP.

## 2017-04-07 ENCOUNTER — Ambulatory Visit (INDEPENDENT_AMBULATORY_CARE_PROVIDER_SITE_OTHER): Payer: Medicare Other | Admitting: Internal Medicine

## 2017-04-07 ENCOUNTER — Encounter: Payer: Self-pay | Admitting: Internal Medicine

## 2017-04-07 ENCOUNTER — Ambulatory Visit (HOSPITAL_BASED_OUTPATIENT_CLINIC_OR_DEPARTMENT_OTHER)
Admission: RE | Admit: 2017-04-07 | Discharge: 2017-04-07 | Disposition: A | Discharge status: Home / Self Care | Payer: Medicare Other | Source: Ambulatory Visit | Attending: Internal Medicine | Admitting: Internal Medicine

## 2017-04-07 VITALS — BP 124/78 | HR 81 | Temp 97.5°F | Resp 14 | Ht 63.0 in | Wt 156.5 lb

## 2017-04-07 DIAGNOSIS — L989 Disorder of the skin and subcutaneous tissue, unspecified: Secondary | ICD-10-CM

## 2017-04-07 DIAGNOSIS — M150 Primary generalized (osteo)arthritis: Secondary | ICD-10-CM

## 2017-04-07 DIAGNOSIS — I10 Essential (primary) hypertension: Secondary | ICD-10-CM

## 2017-04-07 DIAGNOSIS — R079 Chest pain, unspecified: Secondary | ICD-10-CM

## 2017-04-07 DIAGNOSIS — Z23 Encounter for immunization: Secondary | ICD-10-CM

## 2017-04-07 DIAGNOSIS — J449 Chronic obstructive pulmonary disease, unspecified: Secondary | ICD-10-CM | POA: Diagnosis not present

## 2017-04-07 DIAGNOSIS — M159 Polyosteoarthritis, unspecified: Secondary | ICD-10-CM

## 2017-04-07 DIAGNOSIS — M8949 Other hypertrophic osteoarthropathy, multiple sites: Secondary | ICD-10-CM

## 2017-04-07 DIAGNOSIS — M40209 Unspecified kyphosis, site unspecified: Secondary | ICD-10-CM | POA: Diagnosis not present

## 2017-04-07 NOTE — Assessment & Plan Note (Signed)
HTN: Recent CMP satisfactory, on no medications. BP is normal. Hyperlipidemia: Recent FLP satisfactory, continue with Crestor Chest pain: As described above, mechanical in nature,  check a chest x-ray otherwise will call if sxs severe. I agree with the patient, sx might be related to previous surgery. Vertigo: Chronic, at baseline, continue self physical therapy. Recent trial with Antivert did not help Severe kyphosis, DJD: Will prescribe 3 wheel Rollator per patient request. Rash: Also, at the end of the visit he mentioned rash, going on for 2 years, at the left temple: Refer to dermatology  Preventive care: Flu shot today, (-) iFOB 3/18 RTC 4-6 months

## 2017-04-07 NOTE — Patient Instructions (Signed)
  GO TO THE FRONT DESK Schedule your next appointment for a  checkup in 4-6 months    STOP BY THE FIRST FLOOR:  get the XR

## 2017-04-07 NOTE — Progress Notes (Signed)
Subjective:    Patient ID: [name], male    DOB: 08-29-1934, 82 y.o.   MRN: [id number]  DOS:  04/07/2017 Type of visit - description :  Interval history: Went to the ER 02/24/2017 @ [facility]: Symptoms: Shortness of breath, heartburn, dizziness, headaches. Eventually diagnosed was vertigo. EKG, troponin and CT chest : no P.E., + COPD changes.  CBC, CMP essentially normal  On further questioning today, he has vertigo for many years, he practices self physical therapy most mornings with good control of symptoms. No recent worrisome sx such as diplopia, slurred speech or motor deficits.   Also c/o chest pain, this is going on for about a year, sharp, located right from the sternum. Is only with certain movements, last few seconds although the area can be TTP at times. Thinks related to the wires from previous CABG. States that he already talked with her heart doctor, he recommend observation.  Has a very difficult time moving, request a walker.   Review of Systems   Past Medical History:  Diagnosis Date  . Anginal pain (HCC)   . Arthritis    BACK & SHOULDERS  . CAD S/P percutaneous coronary angioplasty 2003   Dr Antoine Poche: 2003: a) s/p CABG (LIMA-LAD, SVG-Diag);b) 2008: prior PCI to native RCA; c) 12/12/2013: SVG-Diag occluded, patent LIMA, 95% post stent in RCA --> PCI  with 2.75 x 18 mm Xience Alpine DES, residual dRCA-PLA 80=-90%, EF 55-65%  . Diverticulosis   . GERD (gastroesophageal reflux disease)   . Gilbert's syndrome   . Heart murmur   . Hyperlipidemia   . Hypertension   . Osteopenia 2010    T score -1.9 @ hip (femoral neck)  . PUD (peptic ulcer disease)    PMH , ? 1995  . Short-term memory loss   . Shortness of breath     Past Surgical History:  Procedure Laterality Date  . CERVICAL FUSION  1997   Dr Jeral Fruit  . CHOLECYSTECTOMY  2004  . CORONARY ARTERY BYPASS GRAFT  2003   Last catheterization was in August 2008 demonstrated a LIMA to th LAD which  was patent, there was an atretic saphenous vein graft to the diagonal, the LAD had a 90% stenosis in the large calcified segment, the diagonal has ostial 70% stenosis, the circumflex had a ramus intermediate with ostial 25% stenosis, the right coronary artery was dominant.  There was an ulcerated 80%-90% stenosis.    . CORONARY STENT PLACEMENT  2008   drug-eluting stent to right coronary artery  . CORONARY STENT PLACEMENT Right 12/12/2013   RT CORONARY  DES       DR COOPER   . HEMORRHOID SURGERY    . LEFT HEART CATHETERIZATION WITH CORONARY/GRAFT ANGIOGRAM N/A 12/12/2013   Procedure: LEFT HEART CATHETERIZATION WITH Isabel Caprice;  Surgeon: Micheline Chapman, MD;  Location: [facility] CATH LAB;  Service: Cardiovascular;  Laterality: N/A;  . TONSILLECTOMY AND ADENOIDECTOMY    . UPPER GASTROINTESTINAL ENDOSCOPY      Dr Victorino Dike    Social History   Social History  . Marital status: Married    Spouse name: [name]  . Number of children: 2  . Years of education: Bachelors   Occupational History  . retired  Retired   Social History Main Topics  . Smoking status: Former Smoker    Quit date: 02/25/1979  . Smokeless tobacco: Never Used     Comment: smoked 1955-1980, up to 1 ppd  . Alcohol use  Yes     Comment: 1/2 cup / day of wine or 1 can beer/day  . Drug use: No  . Sexual activity: No   Other Topics Concern  . Not on file   Social History Narrative   Patient is married ([place]) and lives at home with his wife.   Patient has two adult children- daughters x 2  ([name], [name])   Patient is retired.   Patient has a Bachelor's degree.   Patient is right-handed.   Patient drinks 1/2 liter of green tea daily.   Walks once week for exercise   Born [place]       Allergies as of 04/07/2017      Reactions   Codeine Nausea Only      Medication List       Accurate as of 04/07/17  7:06 PM. Always use your most recent med list.          aspirin EC 81 MG tablet Take  81 mg by mouth daily.   clonazePAM 0.5 MG tablet Commonly known as:  KLONOPIN Take 0.5 tablets (0.25 mg total) by mouth at bedtime. Prn  For REM BD and can be used for Vertigo.   COLON HERBAL CLEANSER Caps Take 1 capsule by mouth daily.   fluticasone 27.5 MCG/SPRAY nasal spray Commonly known as:  VERAMYST Place 1 spray into both nostrils daily. Reported on 09/18/2015   MULTIVITAMIN ADULT PO Take 1 tablet by mouth daily.   nitroGLYCERIN 0.4 MG SL tablet Commonly known as:  NITROSTAT Place 1 tablet (0.4 mg total) under the tongue every 5 (five) minutes as needed for chest pain.   rosuvastatin 20 MG tablet Commonly known as:  CRESTOR Take 1 tablet (20 mg total) by mouth daily.            Durable Medical Equipment        Start     Ordered   04/07/17 0000  For home use only DME Walker rolling    Comments:  3 wheel Rollator with [facility]  Question:  Patient needs a walker to treat with the following condition  Answer:  Kyphosis   04/07/17 1046         Objective:   Physical Exam BP 124/78 (BP Location: Left Arm, Patient Position: Sitting, Cuff Size: Small)   Pulse 81   Temp (!) 97.5 F (36.4 C) (Oral)   Resp 14   Ht 5\' 3"  (1.6 m)   Wt 156 lb 8 oz (71 kg)   SpO2 97%   BMI 27.72 kg/m  General:   Well developed, well nourished . NAD.  HEENT:  Normocephalic . Face symmetric, atraumatic Lungs:  CTA B Normal respiratory effort, no intercostal retractions, no accessory muscle use. Heart: RRR,  soft systolic murmur.  Chest wall: Well-healed CABG scar, no TTP. No swelling or redness Abdomen:  Not distended, soft, non-tender. No rebound or rigidity.  Skin: Not pale. Not jaundice Neurologic:  alert & oriented X3.  Speech normal, gait appropriate for age and unassisted Psych--  Cognition and judgment appear intact.  Cooperative with normal attention span and concentration.  Behavior appropriate. No anxious or depressed appearing.    Assessment & Plan:    Assessment HTN Hyperlipidemia CV: dr [place] --CAD, CABG 2003, stent 2008, cath 2015 [facility](PCI-RCA), dc plavix 02-2015 --Aortic regurgitation, mild mod OSA  --- REM sleep d/o ---> CPAP. Clonazepam qhs prn Vertigo CT head 2012 and 2015 nonacute. 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 BP is normal. Hyperlipidemia: Recent FLP satisfactory, continue with Crestor Chest pain: As described above, mechanical in nature,  check a chest x-ray otherwise will call if sxs severe. I agree with the patient, sx might be related to previous surgery. Vertigo: Chronic, at baseline, continue self physical therapy. Recent trial with Antivert did not help Severe kyphosis, DJD: Will prescribe 3 wheel Rollator per patient request. Rash: Also, at the end of the visit he mentioned rash, going on for 2 years, at the left temple: Refer to dermatology  Preventive care: Flu shot today, (-) iFOB 3/18 RTC 4-6 months  F2F> 30 min , reviewing the chart , coordinating his care

## 2017-04-07 NOTE — Progress Notes (Signed)
Pre visit review using our clinic review tool, if applicable. No additional management support is needed unless otherwise documented below in the visit note. 

## 2017-06-02 ENCOUNTER — Telehealth: Payer: Self-pay | Admitting: Adult Health

## 2017-06-03 ENCOUNTER — Other Ambulatory Visit: Payer: Self-pay | Admitting: Neurology

## 2017-06-03 DIAGNOSIS — G4733 Obstructive sleep apnea (adult) (pediatric): Secondary | ICD-10-CM

## 2017-06-04 NOTE — Telephone Encounter (Signed)
error 

## 2017-07-26 ENCOUNTER — Encounter: Payer: Self-pay | Admitting: Neurology

## 2017-07-28 ENCOUNTER — Telehealth: Payer: Self-pay | Admitting: Neurology

## 2017-07-28 ENCOUNTER — Ambulatory Visit (INDEPENDENT_AMBULATORY_CARE_PROVIDER_SITE_OTHER): Payer: Medicare Other | Admitting: Neurology

## 2017-07-28 ENCOUNTER — Encounter: Payer: Self-pay | Admitting: Neurology

## 2017-07-28 VITALS — BP 125/69 | HR 70 | Ht 62.0 in | Wt 157.0 lb

## 2017-07-28 DIAGNOSIS — Z9989 Dependence on other enabling machines and devices: Secondary | ICD-10-CM | POA: Diagnosis not present

## 2017-07-28 DIAGNOSIS — G4733 Obstructive sleep apnea (adult) (pediatric): Secondary | ICD-10-CM

## 2017-07-28 DIAGNOSIS — G3184 Mild cognitive impairment, so stated: Secondary | ICD-10-CM | POA: Diagnosis not present

## 2017-07-28 DIAGNOSIS — G4752 REM sleep behavior disorder: Secondary | ICD-10-CM

## 2017-07-28 MED ORDER — CLONAZEPAM 0.5 MG PO TABS: 0.5000 mg | ORAL_TABLET | Freq: Every day | ORAL | 5 refills | Status: DC
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
## 2017-07-28 NOTE — Telephone Encounter (Signed)
Patient was just wanting the name of another company for his cpap supplies. I gave him the name [employer]. Patient states that he will call back if he chooses to proceed forward with switching. Pt verbalized understanding.

## 2017-07-28 NOTE — Patient Instructions (Signed)
Clonazepam tablets What is this medicine? CLONAZEPAM (kloe NA ze pam) is a benzodiazepine. It is used to treat certain types of seizures. It is also used to treat panic disorder. This medicine may be used for other purposes; ask your health care provider or pharmacist if you have questions. COMMON BRAND NAME(S): Ceberclon, Klonopin What should I tell my health care provider before I take this medicine? They need to know if you have any of these conditions: -an alcohol or drug abuse problem -bipolar disorder, depression, psychosis or other mental health condition -glaucoma -kidney or liver disease -lung or breathing disease -myasthenia gravis -Parkinson's disease -porphyria -seizures or a history of seizures -suicidal thoughts -an unusual or allergic reaction to clonazepam, other benzodiazepines, foods, dyes, or preservatives -pregnant or trying to get pregnant -breast-feeding How should I use this medicine? Take this medicine by mouth with a glass of water. Follow the directions on the prescription label. If it upsets your stomach, take it with food or milk. Take your medicine at regular intervals. Do not take it more often than directed. Do not stop taking or change the dose except on the advice of your doctor or health care professional. A special MedGuide will be given to you by the pharmacist with each prescription and refill. Be sure to read this information carefully each time. Talk to your pediatrician regarding the use of this medicine in children. Special care may be needed. Overdosage: If you think you have taken too much of this medicine contact a poison control center or emergency room at once. NOTE: This medicine is only for you. Do not share this medicine with others. What if I miss a dose? If you miss a dose, take it as soon as you can. If it is almost time for your next dose, take only that dose. Do not take double or extra doses. What may interact with this medicine? Do  not take this medication with any of the following medicines: -narcotic medicines for cough -sodium oxybate This medicine may also interact with the following medications: -alcohol -antihistamines for allergy, cough and cold -antiviral medicines for HIV or AIDS -certain medicines for anxiety or sleep -certain medicines for depression, like amitriptyline, fluoxetine, sertraline -certain medicines for fungal infections like ketoconazole and itraconazole -certain medicines for seizures like carbamazepine, phenobarbital, phenytoin, primidone -general anesthetics like halothane, isoflurane, methoxyflurane, propofol -local anesthetics like lidocaine, pramoxine, tetracaine -medicines that relax muscles for surgery -narcotic medicines for pain -phenothiazines like chlorpromazine, mesoridazine, prochlorperazine, thioridazine This list may not describe all possible interactions. Give your health care provider a list of all the medicines, herbs, non-prescription drugs, or dietary supplements you use. Also tell them if you smoke, drink alcohol, or use illegal drugs. Some items may interact with your medicine. What should I watch for while using this medicine? Tell your doctor or health care professional if your symptoms do not start to get better or if they get worse. Do not stop taking except on your doctor's advice. You may develop a severe reaction. Your doctor will tell you how much medicine to take. You may get drowsy or dizzy. Do not drive, use machinery, or do anything that needs mental alertness until you know how this medicine affects you. To reduce the risk of dizzy and fainting spells, do not stand or sit up quickly, especially if you are an older patient. Alcohol may increase dizziness and drowsiness. Avoid alcoholic drinks. If you are taking another medicine that also causes drowsiness, you may have more side   effects. Give your health care provider a list of all medicines you use. Your doctor  will tell you how much medicine to take. Do not take more medicine than directed. Call emergency for help if you have problems breathing or unusual sleepiness. The use of this medicine may increase the chance of suicidal thoughts or actions. Pay special attention to how you are responding while on this medicine. Any worsening of mood, or thoughts of suicide or dying should be reported to your health care professional right away. What side effects may I notice from receiving this medicine? Side effects that you should report to your doctor or health care professional as soon as possible: -allergic reactions like skin rash, itching or hives, swelling of the face, lips, or tongue -breathing problems -confusion -loss of balance or coordination -signs and symptoms of low blood pressure like dizziness; feeling faint or lightheaded, falls; unusually weak or tired -suicidal thoughts or mood changes Side effects that usually do not require medical attention (report to your doctor or health care professional if they continue or are bothersome): -dizziness -headache -tiredness -upset stomach This list may not describe all possible side effects. Call your doctor for medical advice about side effects. You may report side effects to FDA at 1-800-FDA-1088. Where should I keep my medicine? Keep out of the reach of children. This medicine can be abused. Keep your medicine in a safe place to protect it from theft. Do not share this medicine with anyone. Selling or giving away this medicine is dangerous and against the law. This medicine may cause accidental overdose and death if taken by other adults, children, or pets. Mix any unused medicine with a substance like cat litter or coffee grounds. Then throw the medicine away in a sealed container like a sealed bag or a coffee can with a lid. Do not use the medicine after the expiration date. Store at room temperature between 15 and 30 degrees C (59 and 86 degrees F).  Protect from light. Keep container tightly closed. NOTE: This sheet is a summary. It may not cover all possible information. If you have questions about this medicine, talk to your doctor, pharmacist, or health care provider.  2018 Elsevier/Gold Standard (2015-11-09 18:46:32)  

## 2017-07-28 NOTE — Telephone Encounter (Signed)
Pt. States he has some questions about his CPAP and would like to be called to discuss them.

## 2017-08-27 ENCOUNTER — Ambulatory Visit: Payer: Medicare Other | Admitting: Internal Medicine

## 2017-08-27 ENCOUNTER — Encounter: Payer: Self-pay | Admitting: Internal Medicine

## 2017-08-27 VITALS — BP 128/78 | HR 79 | Temp 98.2°F | Resp 14 | Ht 62.0 in | Wt 156.4 lb

## 2017-08-27 DIAGNOSIS — R040 Epistaxis: Secondary | ICD-10-CM | POA: Diagnosis not present

## 2017-08-27 DIAGNOSIS — R079 Chest pain, unspecified: Secondary | ICD-10-CM

## 2017-08-27 DIAGNOSIS — R194 Change in bowel habit: Secondary | ICD-10-CM | POA: Diagnosis not present

## 2017-08-27 NOTE — Progress Notes (Signed)
Pre visit review using our clinic review tool, if applicable. No additional management support is needed unless otherwise documented below in the visit note. 

## 2017-08-27 NOTE — Progress Notes (Signed)
Subjective:    Patient ID: [name], male    DOB: 1934-08-31, 83 y.o.   MRN: [id number]  DOS:  08/27/2017 Type of visit - description : acute , has 3 issues to discuss Interval history: Nosebleeds Several days history of nasal congestion , eventually started to see green dry nasal discharge. 5 days ago started with nosebleeds on and off from both nostrils.  Currently better, only sees some blood when he blows his nose.  Change in bowel habits for the last 2 months, used to have BMs daily, now from time to time skips a bowel movement for 2-3 days and he also strains a little.  Continue with chest pain, located at the sternum, mostly on the right side distally.  Increased with certain movements, area become TTP for a while and then he goes back to normal.   Review of Systems  Denies nausea, vomiting, blood in the stools.  No abdominal pain or weight loss. No recent changes in his diet, does not take calcium on iron supplements.  Past Medical History:  Diagnosis Date  . Anginal pain (HCC)   . Arthritis    BACK & SHOULDERS  . CAD S/P percutaneous coronary angioplasty 2003   Dr Antoine Poche: 2003: a) s/p CABG (LIMA-LAD, SVG-Diag);b) 2008: prior PCI to native RCA; c) 12/12/2013: SVG-Diag occluded, patent LIMA, 95% post stent in RCA --> PCI  with 2.75 x 18 mm Xience Alpine DES, residual dRCA-PLA 80=-90%, EF 55-65%  . Diverticulosis   . GERD (gastroesophageal reflux disease)   . Gilbert's syndrome   . Heart murmur   . Hyperlipidemia   . Hypertension   . Osteopenia 2010    T score -1.9 @ hip (femoral neck)  . PUD (peptic ulcer disease)    PMH , ? 1995  . Short-term memory loss   . Shortness of breath     Past Surgical History:  Procedure Laterality Date  . CERVICAL FUSION  1997   Dr Jeral Fruit  . CHOLECYSTECTOMY  2004  . CORONARY ARTERY BYPASS GRAFT  2003   Last catheterization was in August 2008 demonstrated a LIMA to th LAD which was patent, there was an atretic saphenous  vein graft to the diagonal, the LAD had a 90% stenosis in the large calcified segment, the diagonal has ostial 70% stenosis, the circumflex had a ramus intermediate with ostial 25% stenosis, the right coronary artery was dominant.  There was an ulcerated 80%-90% stenosis.    . CORONARY STENT PLACEMENT  2008   drug-eluting stent to right coronary artery  . CORONARY STENT PLACEMENT Right 12/12/2013   RT CORONARY  DES       DR COOPER   . HEMORRHOID SURGERY    . LEFT HEART CATHETERIZATION WITH CORONARY/GRAFT ANGIOGRAM N/A 12/12/2013   Procedure: LEFT HEART CATHETERIZATION WITH Isabel Caprice;  Surgeon: Micheline Chapman, MD;  Location: [facility] C[facility] LAB;  Service: Cardiovascular;  Laterality: N/A;  . TONSILLECTOMY AND ADENOIDECTOMY    . UPPER GASTROINTESTINAL ENDOSCOPY      Dr Victorino Dike    Social History   Socioeconomic History  . Marital status: Married    Spouse name: [name]  . Number of children: 2  . Years of education: Bachelors  . Highest education level: Not on file  Social Needs  . Financial resource strain: Not on file  . Food insecurity - worry: Not on file  . Food insecurity - inability: Not on file  . Transportation needs - medical: Not on  file  . Transportation needs - non-medical: Not on file  Occupational History  . Occupation: retired     Associate Professor: RETIRED  Tobacco Use  . Smoking status: Former Smoker    Last attempt to quit: 02/25/1979    Years since quitting: 38.5  . Smokeless tobacco: Never Used  . Tobacco comment: smoked 1955-1980, up to 1 ppd  Substance and Sexual Activity  . Alcohol use: Yes    Comment: 1/2 cup / day of wine or 1 can beer/day  . Drug use: No  . Sexual activity: No  Other Topics Concern  . Not on file  Social History Narrative   Patient is married ([place]) and lives at home with his wife.   Patient has two adult children- daughters x 2  ([name], [place])   Patient is retired.   Patient has a Bachelor's degree.   Patient is  right-handed.   Patient drinks 1/2 liter of green tea daily.   Walks once week for exercise   Born in [place]       Allergies as of 08/27/2017      Reactions   Codeine Nausea Only      Medication List        Accurate as of 08/27/17 11:59 PM. Always use your most recent med list.          aspirin EC 81 MG tablet Take 81 mg by mouth daily.   clonazePAM 0.5 MG tablet Commonly known as:  KLONOPIN Take 1 tablet (0.5 mg total) by mouth at bedtime. Prn  For REM BD and can be used for Vertigo.   COLON HERBAL CLEANSER Caps Take 1 capsule by mouth daily.   fluticasone 27.5 MCG/SPRAY nasal spray Commonly known as:  VERAMYST Place 1 spray into both nostrils daily. Reported on 09/18/2015   nitroGLYCERIN 0.4 MG SL tablet Commonly known as:  NITROSTAT Place 1 tablet (0.4 mg total) under the tongue every 5 (five) minutes as needed for chest pain.   rosuvastatin 20 MG tablet Commonly known as:  CRESTOR Take 1 tablet (20 mg total) by mouth daily.          Objective:   Physical Exam  Musculoskeletal:       Arms: iBP 128/78 (BP Location: Left Arm, Patient Position: Sitting, Cuff Size: Small)   Pulse 79   Temp 98.2 F (36.8 C) (Oral)   Resp 14   Ht 5\' 2"  (1.575 m)   Wt 156 lb 6 oz (70.9 kg)   SpO2 97%   BMI 28.60 kg/m  General:   Well developed, well nourished . NAD.  HEENT:  Normocephalic . Face symmetric, atraumatic. Nostrils: Both nasal septums erythematous with evidence of recent bleeding.  + dry green mucus noted. Lungs:  CTA B Normal respiratory effort, no intercostal retractions, no accessory muscle use. Heart: RRR,  no murmur.  no pretibial edema bilaterally  Abdomen:  Not distended, soft, non-tender. No rebound or rigidity.   Skin: Not pale. Not jaundice Neurologic:  alert & oriented X3.  Speech normal, gait appropriate for age and unassisted Psych--  Cognition and judgment appear intact.  Cooperative with normal attention span and concentration.    Behavior appropriate. No anxious or depressed appearing.     Assessment & Plan:    Assessment HTN Hyperlipidemia CV: dr [place] --CAD, CABG 2003, stent 2008, cath 2015 [facility]), dc plavix 02-2015 --Aortic regurgitation, mild mod OSA  --- REM sleep d/o ---> CPAP. Clonazepam qhs prn Vertigo CT head 2012 and  2015 nonacute. 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  Rx gentle saline irrigation, Flonase and Astelin at night.  Call if not better Change in bowel movements:  ROS (-)  for red flags.  Had a normal colonoscopy many years ago.  For now, recommend probiotics and increase fiber intake.  See instructions.  We will get a CMP, CBC and TSH to rule out underlying causes of change in bowel movements. Chest pain: As described above, I do not believe he has angina.  Likely is related to previous open heart surgery.  Offered a referral to a CV surgeon, patient declined it, knows to call me if he changes his mind or if sxs severe.  Otherwise recommend Tylenol, warm or cold compresses. RTC 3 months CPX  > 25 min  F2F

## 2017-08-27 NOTE — Patient Instructions (Addendum)
GO TO THE LAB : Get the blood work     GO TO THE FRONT DESK Schedule your next appointment for a physical exam in 3 months  For constipation: Keep yourself hydrated Probiotics OTC such as Align once daily Metamucil, 1 capsule daily Call if not gradually better  For nosebleeds: Gentle saline irrigation Flonase 2 sprays on each side of the nose daily Vaseline at bedtime Call if not better  For chest pain: Tylenol Cold or warm compress as needed

## 2017-08-28 LAB — COMPREHENSIVE METABOLIC PANEL
ALK PHOS: 64 U/L (ref 39–117)
ALT: 16 U/L (ref 0–53)
AST: 16 U/L (ref 0–37)
Albumin: 4.6 g/dL (ref 3.5–5.2)
BILIRUBIN TOTAL: 1.1 mg/dL (ref 0.2–1.2)
BUN: 14 mg/dL (ref 6–23)
CALCIUM: 9.8 mg/dL (ref 8.4–10.5)
CO2: 30 mEq/L (ref 19–32)
CREATININE: 0.93 mg/dL (ref 0.40–1.50)
Chloride: 101 mEq/L (ref 96–112)
GFR: 83.03 mL/min (ref >60.00)
GLUCOSE: 105 mg/dL — AB (ref 70–99)
Potassium: 4.2 mEq/L (ref 3.5–5.1)
Sodium: 137 mEq/L (ref 135–145)
TOTAL PROTEIN: 7.6 g/dL (ref 6.0–8.3)

## 2017-08-28 LAB — CBC WITH DIFFERENTIAL/PLATELET
BASOS ABS: 0 10*3/uL (ref 0.0–0.1)
Basophils Relative: 0.6 % (ref 0.0–3.0)
EOS ABS: 0.3 10*3/uL (ref 0.0–0.7)
Eosinophils Relative: 3.8 % (ref 0.0–5.0)
HCT: 42.6 % (ref 39.0–52.0)
Hemoglobin: 14.8 g/dL (ref 13.0–17.0)
LYMPHS PCT: 14.9 % (ref 12.0–46.0)
Lymphs Abs: 1.1 10*3/uL (ref 0.7–4.0)
MCHC: 34.6 g/dL (ref 30.0–36.0)
MCV: 89.1 fl (ref 78.0–100.0)
MONOS PCT: 7.7 % (ref 3.0–12.0)
Monocytes Absolute: 0.6 10*3/uL (ref 0.1–1.0)
Neutro Abs: 5.4 10*3/uL (ref 1.4–7.7)
Neutrophils Relative %: 73 % (ref 43.0–77.0)
Platelets: 252 10*3/uL (ref 150.0–400.0)
RBC: 4.78 Mil/uL (ref 4.22–5.81)
RDW: 13.1 % (ref 11.5–15.5)
WBC: 7.5 10*3/uL (ref 4.0–10.5)

## 2017-08-28 LAB — TSH: TSH: 1.39 u[IU]/mL (ref 0.35–4.50)

## 2017-08-28 NOTE — Assessment & Plan Note (Signed)
Nosebleeds: Likely due to nasal membrane irritation.  Rx gentle saline irrigation, Flonase and Astelin at night.  Call if not better Change in bowel movements:  ROS (-)  for red flags.  Had a normal colonoscopy many years ago.  For now, recommend probiotics and increase fiber intake.  See instructions.  We will get a CMP, CBC and TSH to rule out underlying causes of change in bowel movements. Chest pain: As described above, I do not believe he has angina.  Likely is related to previous open heart surgery.  Offered a referral to a CV surgeon, patient declined it, knows to call me if he changes his mind or if sxs severe.  Otherwise recommend Tylenol, warm or cold compresses. RTC 3 months CPX

## 2017-10-13 ENCOUNTER — Ambulatory Visit (INDEPENDENT_AMBULATORY_CARE_PROVIDER_SITE_OTHER): Payer: Medicare Other | Admitting: *Deleted

## 2017-10-13 ENCOUNTER — Encounter: Payer: Self-pay | Admitting: *Deleted

## 2017-10-13 VITALS — BP 140/78 | HR 67 | Ht 62.0 in | Wt 157.7 lb

## 2017-10-13 DIAGNOSIS — Z0000 Encounter for general adult medical examination without abnormal findings: Secondary | ICD-10-CM | POA: Diagnosis not present

## 2017-10-13 NOTE — Progress Notes (Addendum)
Subjective:   [name] is a 83 y.o. male who presents for Medicare Annual/Subsequent preventive examination.  Review of Systems: No ROS.  Medicare Wellness Visit. Additional risk factors are reflected in the social history.  Cardiac Risk Factors include: advanced age (>67men, >64 women);male gender;hypertension;dyslipidemia Sleep patterns:  Takes Klonopin.  Wears CPAP.  Sleeps 7-8 hrs.  Home Safety/Smoke Alarms: Feels safe in home. Smoke alarms in place.  Living environment; residence and Firearm Safety: Lives with wife in split level home.  Male:     PSA-  Lab Results  Component Value Date   PSA 0.98 06/04/2006       Objective:    Vitals: BP 140/78 (BP Location: Left Arm, Patient Position: Sitting, Cuff Size: Normal)   Pulse 67   Ht  (1.575 m)   Wt 157 lb 11.2 oz (71.5 kg)   SpO2 96%   BMI 28.84 kg/m   Body mass index is 28.84 kg/m.  Advanced Directives 10/13/2017 08/15/2016 08/11/2016 01/31/2016 01/24/2016 12/14/2015 10/27/2014  Does Patient Have a Medical Advance Directive? Yes Yes Yes Yes Yes Yes Yes  Type of Estate agent of [place];Living will Healthcare Power of [place];Living will Healthcare Power of [place];Living will Healthcare Power of [place];Living will Living will;Healthcare Power of Attorney Living will;Healthcare Power of Attorney -  Does patient want to make changes to medical advance directive? No - Patient declined - No - Patient declined - - - -  Copy of Healthcare Power of Attorney in Chart? No - copy requested - No - copy requested No - copy requested - No - copy requested -    Tobacco Social History   Tobacco Use  Smoking Status Former Smoker  . Last attempt to quit: 02/25/1979  . Years since quitting: 38.6  Smokeless Tobacco Never Used  Tobacco Comment   smoked 1955-1980, up to 1 ppd     Counseling given: Not Answered Comment: smoked 1955-1980, up to 1 ppd   Clinical Intake: Pain : No/denies pain       Past Medical History:  Diagnosis Date  . Anginal pain (HCC)   . Arthritis    BACK & SHOULDERS  . CAD S/P percutaneous coronary angioplasty 2003   Dr Antoine Poche: 2003: a) s/p CABG (LIMA-LAD, SVG-Diag);b) 2008: prior PCI to native RCA; c) 12/12/2013: SVG-Diag occluded, patent LIMA, 95% post stent in RCA --> PCI  with 2.75 x 18 mm Xience Alpine DES, residual dRCA-PLA 80=-90%, EF 55-65%  . Diverticulosis   . GERD (gastroesophageal reflux disease)   . Gilbert's syndrome   . Heart murmur   . Hyperlipidemia   . Hypertension   . Osteopenia 2010    T score -1.9 @ hip (femoral neck)  . PUD (peptic ulcer disease)    PMH , ? 1995  . Short-term memory loss   . Shortness of breath    Past Surgical History:  Procedure Laterality Date  . CERVICAL FUSION  1997   Dr Jeral Fruit  . CHOLECYSTECTOMY  2004  . CORONARY ARTERY BYPASS GRAFT  2003   Last catheterization was in August 2008 demonstrated a LIMA to th LAD which was patent, there was an atretic saphenous vein graft to the diagonal, the LAD had a 90% stenosis in the large calcified segment, the diagonal has ostial 70% stenosis, the circumflex had a ramus intermediate with ostial 25% stenosis, the right coronary artery was dominant.  There was an ulcerated 80%-90% stenosis.    . CORONARY STENT PLACEMENT  2008  drug-eluting stent to right coronary artery  . CORONARY STENT PLACEMENT Right 12/12/2013   RT CORONARY  DES       DR COOPER   . HEMORRHOID SURGERY    . LEFT HEART CATHETERIZATION WITH CORONARY/GRAFT ANGIOGRAM N/A 12/12/2013   Procedure: LEFT HEART CATHETERIZATION WITH Isabel Caprice;  Surgeon: Micheline Chapman, MD;  Location[facility] C[facility] LAB;  Service: Cardiovascular;  Laterality: N/A;  . TONSILLECTOMY AND ADENOIDECTOMY    . UPPER GASTROINTESTINAL ENDOSCOPY      Dr Victorino Dike   Family History  Problem Relation Age of Onset  . Cancer Father        ? primary  . Uterine cancer Daughter   . Hypertension Maternal Grandmother   .  Peripheral vascular disease Maternal Grandmother   . Osteoporosis Sister   . Diabetes Sister   . Stroke Neg Hx   . Heart attack Neg Hx   . Colon cancer Neg Hx   . Prostate cancer Neg Hx    Social History   Socioeconomic History  . Marital status: Married    Spouse name: [name]  . Number of children: 2  . Years of education: Bachelors  . Highest education level: Not on file  Occupational History  . Occupation: retired     Associate Professor: RETIRED  Social Needs  . Financial resource strain: Not on file  . Food insecurity:    Worry: Not on file    Inability: Not on file  . Transportation needs:    Medical: Not on file    Non-medical: Not on file  Tobacco Use  . Smoking status: Former Smoker    Last attempt to quit: 02/25/1979    Years since quitting: 38.6  . Smokeless tobacco: Never Used  . Tobacco comment: smoked 1955-1980, up to 1 ppd  Substance and Sexual Activity  . Alcohol use: Yes    Comment: 1/2 cup / day of wine or 1 can beer/day  . Drug use: No  . Sexual activity: Never  Lifestyle  . Physical activity:    Days per week: Not on file    Minutes per session: Not on file  . Stress: Not on file  Relationships  . Social connections:    Talks on phone: Not on file    Gets together: Not on file    Attends religious service: Not on file    Active member of club or organization: Not on file    Attends meetings of clubs or organizations: Not on file    Relationship status: Not on file  Other Topics Concern  . Not on file  Social History Narrative   Patient is married ([name]) and lives at home with his wife.   Patient has two adult children- daughters x 2  ([name], [place])   Patient is retired.   Patient has a Bachelor's degree.   Patient is right-handed.   Patient drinks 1/2 liter of green tea daily.   Walks once week for exercise   Born in [place]     Outpatient Encounter Medications as of 10/13/2017  Medication Sig  . aspirin EC 81 MG tablet Take 81 mg by  mouth daily.  . clonazePAM (KLONOPIN) 0.5 MG tablet Take 1 tablet (0.5 mg total) by mouth at bedtime. Prn  For REM BD and can be used for Vertigo.  . fluticasone (VERAMYST) 27.5 MCG/SPRAY nasal spray Place 1 spray into both nostrils daily. Reported on 09/18/2015  . Misc Natural Products (COLON HERBAL CLEANSER) CAPS Take  1 capsule by mouth daily.  . rosuvastatin (CRESTOR) 20 MG tablet Take 1 tablet (20 mg total) by mouth daily.  . nitroGLYCERIN (NITROSTAT) 0.4 MG SL tablet Place 1 tablet (0.4 mg total) under the tongue every 5 (five) minutes as needed for chest pain. (Patient not taking: Reported on 07/28/2017)   No facility-administered encounter medications on file as of 10/13/2017.     Activities of Daily Living In your present state of health, do you have any difficulty performing the following activities: 10/13/2017 08/27/2017  Hearing? N N  Vision? N N  Comment wearing glasses. Eye doctor 1x/yr -  Difficulty concentrating or making decisions? N N  Walking or climbing stairs? N N  Dressing or bathing? N N  Doing errands, shopping? N N  Preparing Food and eating ? N -  Using the Toilet? N -  In the past six months, have you accidently leaked urine? N -  Do you have problems with loss of bowel control? N -  Managing your Medications? N -  Managing your Finances? N -  Housekeeping or managing your Housekeeping? N -  Some recent data might be hidden    Patient Care Team: [name], MD as PCP - General (Internal Medicine) Dohmeier, Porfirio Mylar, MD as Consulting Physician (Neurology) [place], Peter M, MD as Consulting Physician (Cardiology)   Assessment:   This is a routine wellness examination for [place]. No ROS.  Medicare Wellness Visit. Additional risk factors are reflected in the social history.   Exercise Activities and Dietary recommendations Current Exercise Habits: Home exercise routine, Exercise limited by: None identified Diet (meal preparation, eat out, water intake, caffeinated  beverages, dairy products, fruits and vegetables): in general, a "healthy" diet  , well balanced     Goals    . Increase physical activity       Fall Risk Fall Risk  10/13/2017 08/27/2017 08/15/2016 08/11/2016 07/04/2016  Falls in the past year? No No No No No  Risk for fall due to : - - - - Other (Comment)    Depression Screen PHQ 2/9 Scores 10/13/2017 08/27/2017 08/15/2016 08/11/2016  PHQ - 2 Score 0 0 0 2  PHQ- 9 Score - - - 4  Exception Documentation - - - -    Cognitive Function Ad8 score reviewed for issues:  Issues making decisions:no  Less interest in hobbies / activities:no  Repeats questions, stories (family complaining):no  Trouble using ordinary gadgets (microwave, computer, phone):no  Forgets the month or year: no  Mismanaging finances: no  Remembering appts:no  Daily problems with thinking and/or memory:no Ad8 score is=0  MMSE - Mini Mental State Exam 01/19/2017 08/11/2016 07/14/2016 10/09/2015  Orientation to time Orientation to Place Registration Attention/ Calculation Recall Language- name 2 objects Language- repeat 1 1 0 0  Language- follow 3 step command Language- read & follow direction Write a sentence Copy design Total score Montreal Cognitive Assessment  01/10/2016 01/10/2016 05/30/2015 05/30/2015  Visuospatial/ Executive (0/5) Naming (0/3) Attention: Read list of digits (0/2) Attention: Read list of letters (0/1) Attention: Serial  7 subtraction starting at 100 (0/3) Language: Repeat phrase (0/2) 0 0 1 2  Language : Fluency (0/1) 0 0 0 1  Abstraction (0/2) Delayed Recall (0/5) Orientation (0/6) Total Adjusted Score (based on education) - 23 - 30      Immunization History  Administered Date(s) Administered  . Influenza Split 03/02/2012  . Influenza Whole  04/27/2007, 03/29/2008, 04/25/2009  . Influenza, High Dose Seasonal PF 02/19/2016, 04/07/2017  . Influenza,inj,Quad PF,6+ Mos 04/10/2014, 08/31/2015  . Pneumococcal Conjugate-13 10/19/2014  . Pneumococcal Polysaccharide-23 10/23/2015  . Td 10/23/2015    Screening Tests Health Maintenance  Topic Date Due  . INFLUENZA VACCINE  01/14/2018  . TETANUS/TDAP  10/22/2025  . PNA vac Low Risk Adult  Completed       Plan:   Follow up with Dr.Paz as scheduled 11/24/17.   Eat heart healthy diet (full of fruits, vegetables, whole grains, lean protein, water--limit salt, fat, and sugar intake) and increase physical activity as tolerated.  Continue doing brain stimulating activities (puzzles, reading, adult coloring books, staying active) to keep memory sharp.   Bring a copy of your living will and/or healthcare power of attorney to your next office visit.  Schedule appointment to follow up with me in 1 yr.  I have personally reviewed and noted the following in the patient's chart:   . Medical and social history . Use of alcohol, tobacco or illicit drugs  . Current medications and supplements . Functional ability and status . Nutritional status . Physical activity . Advanced directives . List of other physicians . Hospitalizations, surgeries, and ER visits in previous 12 months . Vitals . Screenings to include cognitive, depression, and falls . Referrals and appointments  In addition, I have reviewed and discussed with patient certain preventive protocols, quality metrics, and best practice recommendations. A written personalized care plan for preventive services as well as general preventive health recommendations were provided to patient.     Mady Haagensen [place], [place]  10/13/2017 [name], MD

## 2017-10-13 NOTE — Patient Instructions (Addendum)
Follow up with Dr.Paz as scheduled 11/24/17.   Eat heart healthy diet (full of fruits, vegetables, whole grains, lean protein, water--limit salt, fat, and sugar intake) and increase physical activity as tolerated.  Continue doing brain stimulating activities (puzzles, reading, adult coloring books, staying active) to keep memory sharp.   Bring a copy of your living will and/or healthcare power of attorney to your next office visit.  Schedule appointment to follow up with me in 1 yr.   [name] , Thank you for taking time to come for your Medicare Wellness Visit. I appreciate your ongoing commitment to your health goals. Please review the following plan we discussed and let me know if I can assist you in the future.   These are the goals we discussed: Goals    . Increase physical activity       This is a list of the screening recommended for you and due dates:  Health Maintenance  Topic Date Due  . Flu Shot  01/14/2018  . Tetanus Vaccine  10/22/2025  . Pneumonia vaccines  Completed    Health Maintenance, Male A healthy lifestyle and preventive care is important for your health and wellness. Ask your health care provider about what schedule of regular examinations is right for you. What should I know about weight and diet? Eat a Healthy Diet  Eat plenty of vegetables, fruits, whole grains, low-fat dairy products, and lean protein.  Do not eat a lot of foods high in solid fats, added sugars, or salt.  Maintain a Healthy Weight Regular exercise can help you achieve or maintain a healthy weight. You should:  Do at least 150 minutes of exercise each week. The exercise should increase your heart rate and make you sweat (moderate-intensity exercise).  Do strength-training exercises at least twice a week.  Watch Your Levels of Cholesterol and Blood Lipids  Have your blood tested for lipids and cholesterol every 5 years starting at 81 years of age. If you are at high risk for  heart disease, you should start having your blood tested when you are 81 years old. You may need to have your cholesterol levels checked more often if: ? Your lipid or cholesterol levels are high. ? You are older than 81 years of age. ? You are at high risk for heart disease.  What should I know about cancer screening? Many types of cancers can be detected early and may often be prevented. Lung Cancer  You should be screened every year for lung cancer if: ? You are a current smoker who has smoked for at least 30 years. ? You are a former smoker who has quit within the past 15 years.  Talk to your health care provider about your screening options, when you should start screening, and how often you should be screened.  Colorectal Cancer  Routine colorectal cancer screening usually begins at 81 years of age and should be repeated every 5-10 years until you are 81 years old. You may need to be screened more often if early forms of precancerous polyps or small growths are found. Your health care provider may recommend screening at an earlier age if you have risk factors for colon cancer.  Your health care provider may recommend using home test kits to check for hidden blood in the stool.  A small camera at the end of a tube can be used to examine your colon (sigmoidoscopy or colonoscopy). This checks for the earliest forms of colorectal cancer.  Prostate  and Testicular Cancer  Depending on your age and overall health, your health care provider may do certain tests to screen for prostate and testicular cancer.  Talk to your health care provider about any symptoms or concerns you have about testicular or prostate cancer.  Skin Cancer  Check your skin from head to toe regularly.  Tell your health care provider about any new moles or changes in moles, especially if: ? There is a change in a mole's size, shape, or color. ? You have a mole that is larger than a pencil eraser.  Always use  sunscreen. Apply sunscreen liberally and repeat throughout the day.  Protect yourself by wearing long sleeves, pants, a wide-brimmed hat, and sunglasses when outside.  What should I know about heart disease, diabetes, and high blood pressure?  If you are 87-27 years of age, have your blood pressure checked every 3-5 years. If you are 64 years of age or older, have your blood pressure checked every year. You should have your blood pressure measured twice-once when you are at a hospital or clinic, and once when you are not at a hospital or clinic. Record the average of the two measurements. To check your blood pressure when you are not at a hospital or clinic, you can use: ? An automated blood pressure machine at a pharmacy. ? A home blood pressure monitor.  Talk to your health care provider about your target blood pressure.  If you are between 10-43 years old, ask your health care provider if you should take aspirin to prevent heart disease.  Have regular diabetes screenings by checking your fasting blood sugar level. ? If you are at a normal weight and have a low risk for diabetes, have this test once every three years after the age of 33. ? If you are overweight and have a high risk for diabetes, consider being tested at a younger age or more often.  A one-time screening for abdominal aortic aneurysm (AAA) by ultrasound is recommended for men aged 29-75 years who are current or former smokers. What should I know about preventing infection? Hepatitis B If you have a higher risk for hepatitis B, you should be screened for this virus. Talk with your health care provider to find out if you are at risk for hepatitis B infection. Hepatitis C Blood testing is recommended for:  Everyone born from [birth year] through 1965.  Anyone with known risk factors for hepatitis C.  Sexually Transmitted Diseases (STDs)  You should be screened each year for STDs including gonorrhea and chlamydia if: ? You are  sexually active and are younger than 81 years of age. ? You are older than 81 years of age and your health care provider tells you that you are at risk for this type of infection. ? Your sexual activity has changed since you were last screened and you are at an increased risk for chlamydia or gonorrhea. Ask your health care provider if you are at risk.  Talk with your health care provider about whether you are at high risk of being infected with HIV. Your health care provider may recommend a prescription medicine to help prevent HIV infection.  What else can I do?  Schedule regular health, dental, and eye exams.  Stay current with your vaccines (immunizations).  Do not use any tobacco products, such as cigarettes, chewing tobacco, and e-cigarettes. If you need help quitting, ask your health care provider.  Limit alcohol intake to no more than 2 drinks  per day. One drink equals 12 ounces of beer, 5 ounces of wine, or 1 ounces of hard liquor.  Do not use street drugs.  Do not share needles.  Ask your health care provider for help if you need support or information about quitting drugs.  Tell your health care provider if you often feel depressed.  Tell your health care provider if you have ever been abused or do not feel safe at home. This information is not intended to replace advice given to you by your health care provider. Make sure you discuss any questions you have with your health care provider. Document Released: 11/29/2007 Document Revised: 01/30/2016 Document Reviewed: 03/06/2015 Elsevier Interactive Patient Education  [employer].

## 2017-11-09 IMAGING — DX DG CERVICAL SPINE COMPLETE 4+V
6 series · 6 of 6 positions shown · non-contrast
Comparison: CT 01/15/2014 .

CLINICAL DATA: Neck pain.  No known injury

EXAM:
CERVICAL SPINE - COMPLETE 4+ VIEW

[c-spine lat]
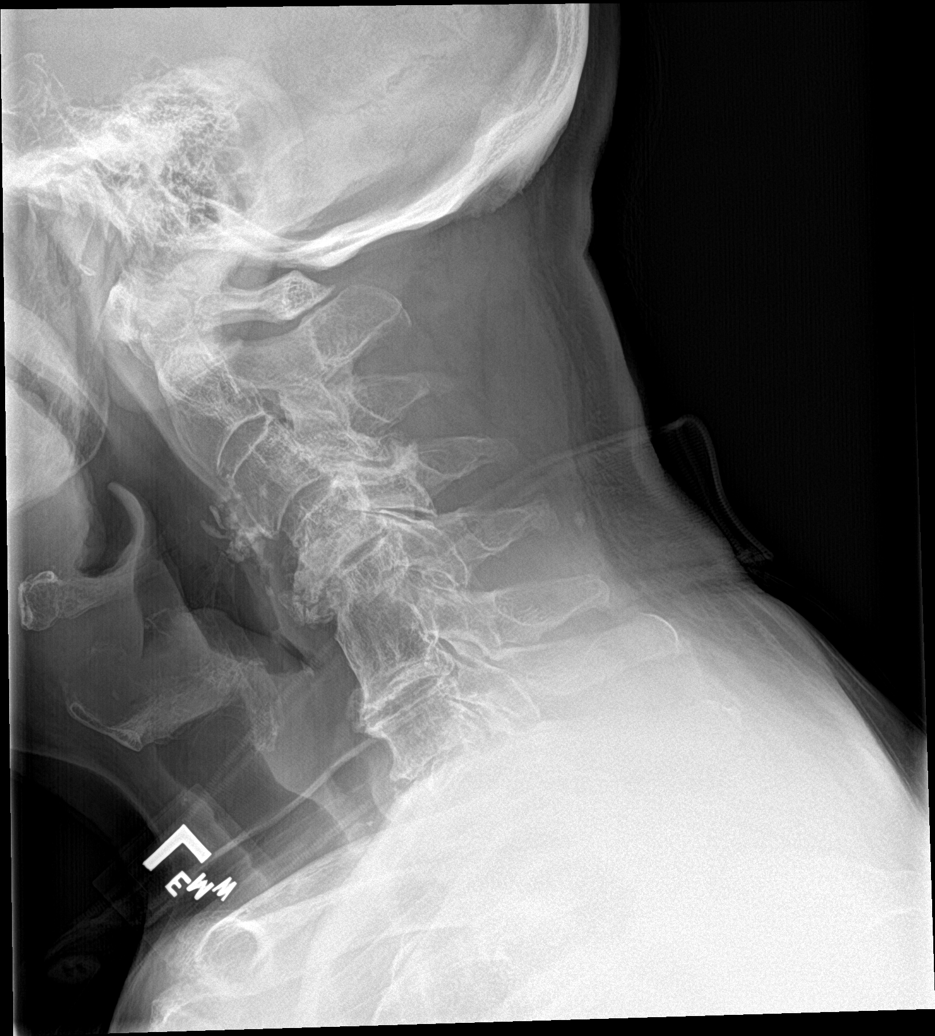

[c-spine obl (1 of 2)]
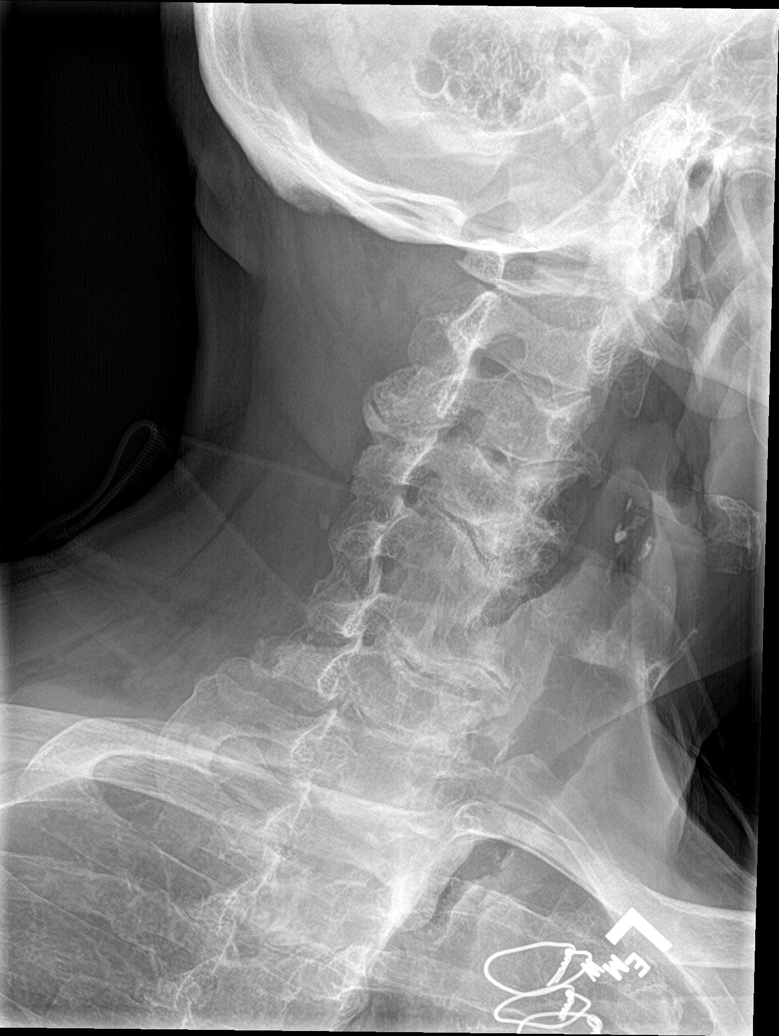

[c-spine obl (2 of 2)]
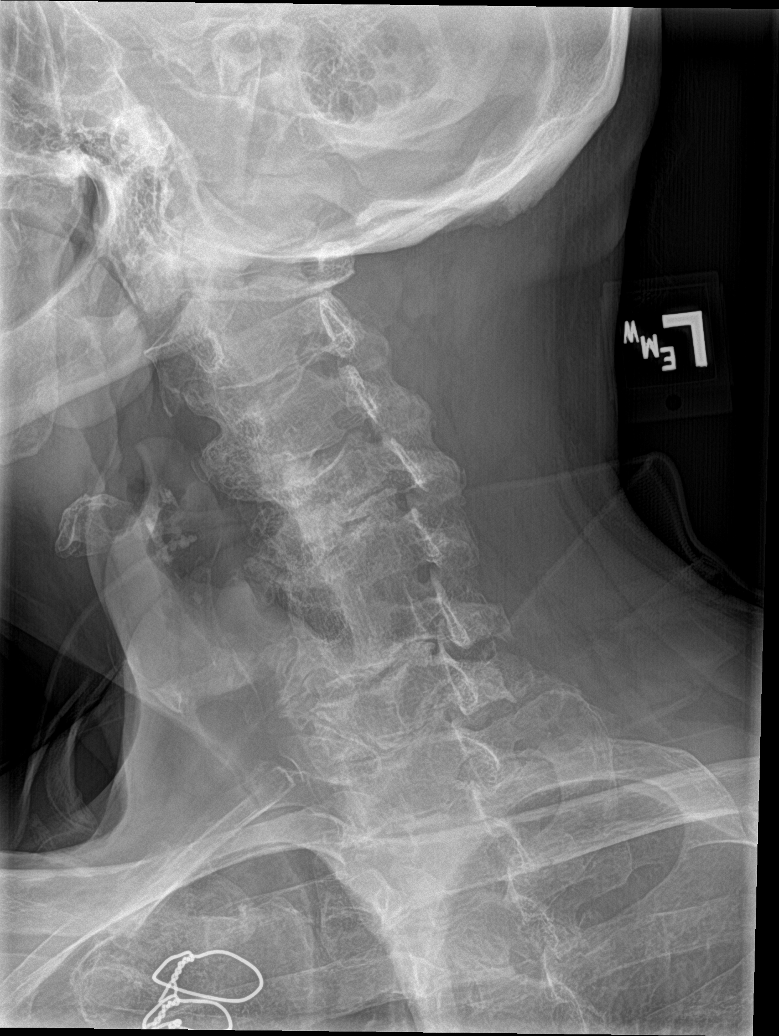

[c-spine ap]
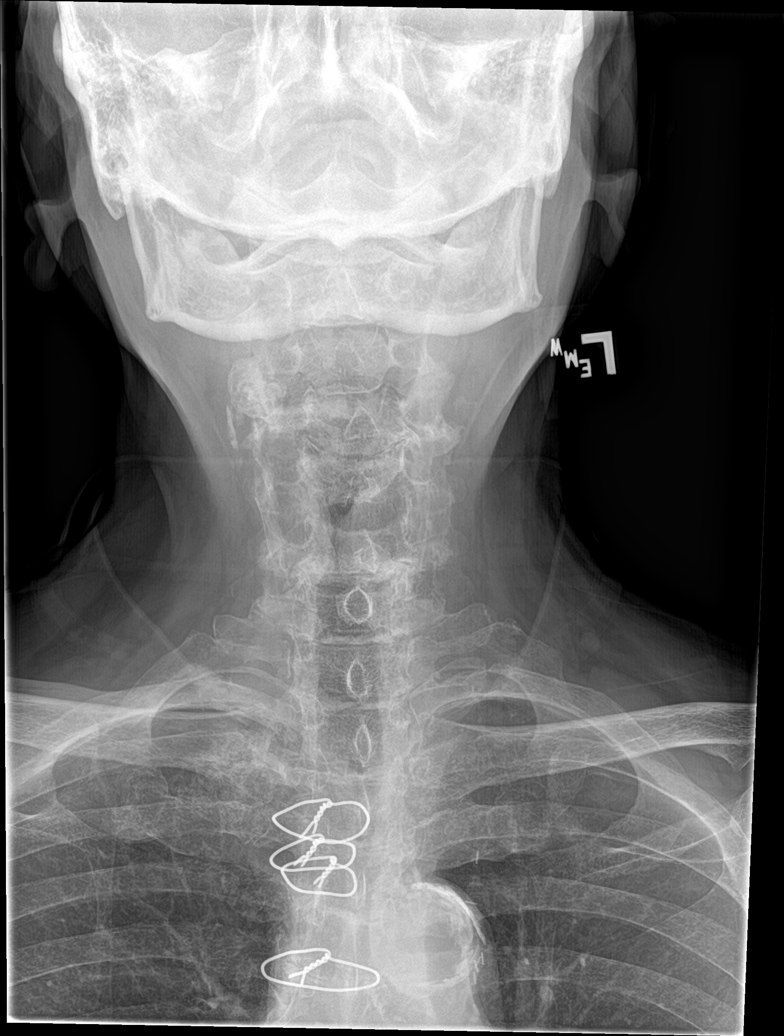

[c-spine open mouth]
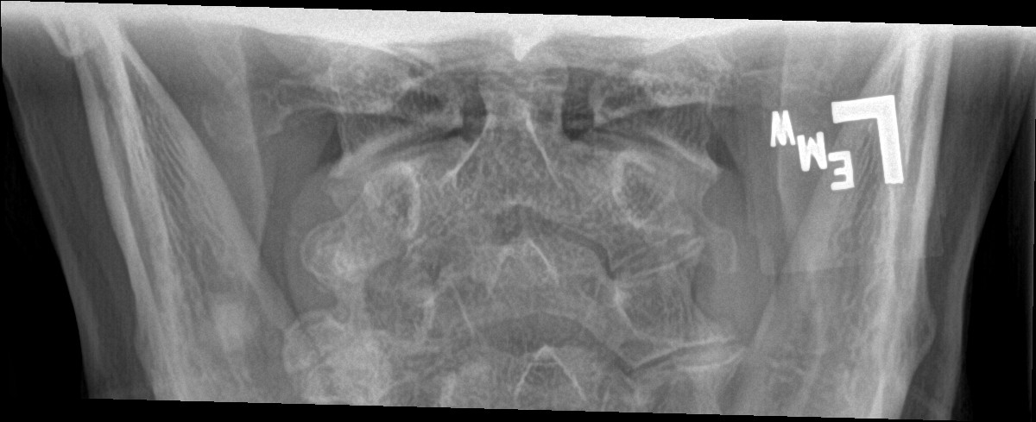

[c-spine swimmers]
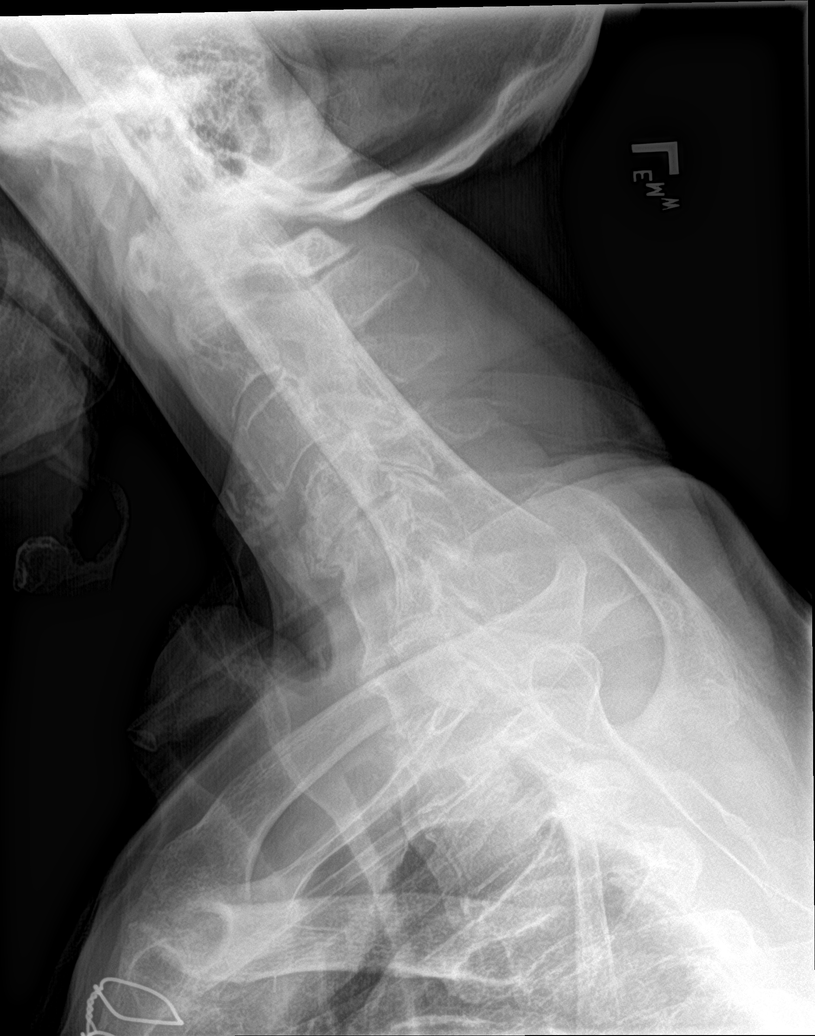

[6 of 6 positions shown; findings below may reference images not displayed]

FINDINGS: Diffuse multilevel degenerative change of the cervical spine with
C5-C6 fusion. Angulation deformity noted C4-C5. No evidence of
fracture or dislocation. Pulmonary apices are clear. Prior median
sternotomy. Bilateral carotid vascular calcification.
IMPRESSION: 1. Diffuse severe multilevel degenerative change with C5-C6 fusion.
Angulation deformity noted C4-C5. No prominent spondylolisthesis. No
evidence fracture or dislocation.

2.  Bilateral carotid vascular disease.

## 2017-11-24 ENCOUNTER — Ambulatory Visit: Payer: Medicare Other | Admitting: Internal Medicine

## 2017-12-09 ENCOUNTER — Telehealth: Payer: Self-pay

## 2017-12-09 NOTE — Telephone Encounter (Signed)
Patient is wondering if he could possibly get his Clonazepam 0.5mg  for quantity of 90 instead of 30. He checked with [facility] and he can get #90 for just a few cents more than he has been paying for #30. I checked the Rx registry and his last fill date was 10/05/17. Confirmed pharmacy.

## 2017-12-09 NOTE — Telephone Encounter (Signed)
I spoke with patient and made him aware that we could not do #90 at this time, but we will discuss with Dr. Vickey Hugerohmeier when she returns. He will be close to needing his next refill by then. I will forward this to Dr. Vickey Hugerohmeier. Patient voiced understanding and will get his supply of 30 for another month for now.

## 2017-12-09 NOTE — Telephone Encounter (Signed)
I don't think you are supposed to give a 90-day supply of controlled medication needs to be monthly sorry. I would wait and discuss with Dr. Vickey Hugerohmeier.

## 2017-12-28 ENCOUNTER — Telehealth: Payer: Self-pay | Admitting: Neurology

## 2017-12-28 MED ORDER — CLONAZEPAM 0.5 MG PO TABS: 0.5000 mg | ORAL_TABLET | Freq: Every day | ORAL | 1 refills | Status: DC

## 2017-12-28 NOTE — Telephone Encounter (Signed)
Spoke with Dr Vickey Hugerohmeier and she is ok with the patient getting a 90 day supply. Will order and send to [facility] so that next month the pt can get it.

## 2017-12-31 ENCOUNTER — Other Ambulatory Visit: Payer: Self-pay | Admitting: Cardiology

## 2017-12-31 MED ORDER — ROSUVASTATIN CALCIUM 20 MG PO TABS: 20.0000 mg | ORAL_TABLET | Freq: Every day | ORAL | 0 refills | Status: DC

## 2017-12-31 NOTE — Telephone Encounter (Signed)
NEW MESSAGE        1. Which medications need to be refilled? (please list name of each medication and dose if known) rosuvastatin (CRESTOR) 20 MG tablet  2. Which pharmacy/location (including street and city if local pharmacy) is medication to be sent to?[id number] - [place], [place] - [address]  3. Do they need a 30 day or 90 day supply? 90

## 2017-12-31 NOTE — Telephone Encounter (Signed)
Rx request sent to pharmacy.  

## 2018-01-05 NOTE — Telephone Encounter (Signed)
[facility] pharmacy has called and I gave them the ok for early refill for the pt.

## 2018-01-05 NOTE — Telephone Encounter (Signed)
Patient went to get his [facility]PAM ([facility]IN[facility] and was told he cannot get medication until 01-09-18. He says he only has 2 pills left for tonight and tomorrow night. He does not know what happened to other pills. Can [facility] be called and ok this refill? Please call patient and advise.

## 2018-03-29 ENCOUNTER — Other Ambulatory Visit: Payer: Self-pay | Admitting: Cardiology

## 2018-04-19 NOTE — Progress Notes (Signed)
[name] Date of Birth: Oct 23, 1934 Medical Record #[id number]  History of Present Illness: [name] is seen for follow up CAD. He is s/p CABG in 2003 by Dr. Cornelius Moras with LIMA to the LAD and SVG to the Diagonal. By cath in 2008 he had a high grade proximal RCA stenosis and had stenting with a 2.75 mm Promus stent. He was noted to have an atretic SVG to the diagonal but it was felt that antegrade flow into the diagonal was OK. His last stress Myoview in November 2012 showed a fixed apical defect without ischemia and EF 62%.  In May 2015 he presented with unstable angina. Cardiac cath showed patent LIMA to the LAD and occlusion of SVG to the diagonal. There was a severe stenosis in the RCA which was stented with a DES. He also had an Echo in 9/15 for a murmur and has AV sclerosis with mild to moderate AI.  He was seen in the ED in [place] in September 2018 for evaluation of dyspnea, nausea, hearburn, and vertigo. Evaluation with blood work and Ecg unremarkable. CT chest negative for PE or other acute findings. He was treated for vertigo with resolution.    On follow up today he is doing fairly well.  He still notes occasional pain in sternum if he turns a certain way. This is very sharp and momentary. No dyspnea. No other chest pain. He has chronic back and leg pain. Intermittent constipation. Occasional phlegm.  Allergies as of 04/22/2018      Reactions   Codeine Nausea Only      Medication List        Accurate as of 04/22/18 10:31 AM. Always use your most recent med list.          aspirin EC 81 MG tablet Take 81 mg by mouth daily.   clonazePAM 0.5 MG tablet Commonly known as:  KLONOPIN Take 1 tablet (0.5 mg total) by mouth at bedtime. Prn  For REM BD and can be used for Vertigo.   COLON HERBAL CLEANSER Caps Take 1 capsule by mouth daily.   fluticasone 27.5 MCG/SPRAY nasal spray Commonly known as:  VERAMYST Place 1 spray into both nostrils daily. Reported on  09/18/2015   nitroGLYCERIN 0.4 MG SL tablet Commonly known as:  NITROSTAT Place 1 tablet (0.4 mg total) under the tongue every 5 (five) minutes as needed for chest pain.   rosuvastatin 20 MG tablet Commonly known as:  CRESTOR TAKE 1 TABLET BY MOUTH ONCE A DAY **NEEDS TO SCHEDULE APPOINTMENT FOR REFILLS**        Allergies  Allergen Reactions  . Codeine Nausea Only    Past Medical History:  Diagnosis Date  . Anginal pain (HCC)   . Arthritis    BACK & SHOULDERS  . CAD S/P percutaneous coronary angioplasty 2003   Dr Antoine Poche: 2003: a) s/p CABG (LIMA-LAD, SVG-Diag);b) 2008: prior PCI to native RCA; c) 12/12/2013: SVG-Diag occluded, patent LIMA, 95% post stent in RCA --> PCI  with 2.75 x 18 mm Xience Alpine DES, residual dRCA-PLA 80=-90%, EF 55-65%  . Diverticulosis   . GERD (gastroesophageal reflux disease)   . Gilbert's syndrome   . Heart murmur   . Hyperlipidemia   . Hypertension   . Osteopenia 2010    T score -1.9 @ hip (femoral neck)  . PUD (peptic ulcer disease)    PMH , ? 1995  . Short-term memory loss   . Shortness of breath  Past Surgical History:  Procedure Laterality Date  . CERVICAL FUSION  1997   Dr Jeral Fruit  . CHOLECYSTECTOMY  2004  . CORONARY ARTERY BYPASS GRAFT  2003   Last catheterization was in August 2008 demonstrated a LIMA to th LAD which was patent, there was an atretic saphenous vein graft to the diagonal, the LAD had a 90% stenosis in the large calcified segment, the diagonal has ostial 70% stenosis, the circumflex had a ramus intermediate with ostial 25% stenosis, the right coronary artery was dominant.  There was an ulcerated 80%-90% stenosis.    . CORONARY STENT PLACEMENT  2008   drug-eluting stent to right coronary artery  . CORONARY STENT PLACEMENT Right 12/12/2013   RT CORONARY  DES       DR COOPER   . HEMORRHOID SURGERY    . LEFT HEART CATHETERIZATION WITH CORONARY/GRAFT ANGIOGRAM N/A 12/12/2013   Procedure: LEFT HEART CATHETERIZATION WITH  [name];  Surgeon: Micheline Chapman, MD;  Location[facility] CATH LAB;  Service: Cardiovascular;  Laterality: N/A;  . TONSILLECTOMY AND ADENOIDECTOMY    . UPPER GASTROINTESTINAL ENDOSCOPY      Dr Victorino Dike    Social History   Socioeconomic History  . Marital status: Married    Spouse name: [name]  . Number of children: 2  . Years of education: Bachelors  . Highest education level: Not on file  Occupational History  . Occupation: retired     Associate Professor: RETIRED  Social Needs  . Financial resource strain: Not on file  . Food insecurity:    Worry: Not on file    Inability: Not on file  . Transportation needs:    Medical: Not on file    Non-medical: Not on file  Tobacco Use  . Smoking status: Former Smoker    Last attempt to quit: 02/25/1979    Years since quitting: 39.1  . Smokeless tobacco: Never Used  . Tobacco comment: smoked 1955-1980, up to 1 ppd  Substance and Sexual Activity  . Alcohol use: Yes    Comment: 1/2 cup / day of wine or 1 can beer/day  . Drug use: No  . Sexual activity: Never  Lifestyle  . Physical activity:    Days per week: Not on file    Minutes per session: Not on file  . Stress: Not on file  Relationships  . Social connections:    Talks on phone: Not on file    Gets together: Not on file    Attends religious service: Not on file    Active member of club or organization: Not on file    Attends meetings of clubs or organizations: Not on file    Relationship status: Not on file  Other Topics Concern  . Not on file  Social History Narrative   Patient is married ([place]) and lives at home with his wife.   Patient has two adult children- daughters x 2  ([name], [place])   Patient is retired.   Patient has a Bachelor's degree.   Patient is right-handed.   Patient drinks 1/2 liter of green tea daily.   Walks once week for exercise   Born in [place]     Family History  Problem Relation Age of Onset  . Cancer Father        ?  primary  . Uterine cancer Daughter   . Hypertension Maternal Grandmother   . Peripheral vascular disease Maternal Grandmother   . Osteoporosis Sister   . Diabetes  Sister   . Stroke Neg Hx   . Heart attack Neg Hx   . Colon cancer Neg Hx   . Prostate cancer Neg Hx     Review of Systems: As noted in HPI.  All other systems were reviewed and are negative.  Physical Exam: BP 110/74   Pulse 75   Ht 5\' 2"  (1.575 m)   Wt 157 lb 9.6 oz (71.5 kg)   BMI 28.83 kg/m  Filed Weights   04/22/18 1005  Weight: 157 lb 9.6 oz (71.5 kg)  GENERAL:  Well appearing elderly WM in NAD HEENT:  PERRL, EOMI, sclera are clear. Oropharynx is clear. NECK:  No jugular venous distention, carotid upstroke brisk and symmetric, no bruits, no thyromegaly or adenopathy LUNGS:  Clear to auscultation bilaterally CHEST:  Unremarkable, kyphotic HEART:  RRR,  PMI not displaced or sustained,S1 and S2 within normal limits, no S3, no S4: no clicks, no rubs, no murmurs ABD:  Soft, nontender. BS +, no masses or bruits. No hepatomegaly, no splenomegaly EXT:  2 + pulses throughout, no edema, no cyanosis no clubbing SKIN:  Warm and dry.  No rashes NEURO:  Alert and oriented x 3. Cranial nerves II through XII intact. PSYCH:  Cognitively intact        LABORATORY DATA:  Lab Results  Component Value Date   WBC 7.5 08/27/2017   HGB 14.8 08/27/2017   HCT 42.6 08/27/2017   PLT 252.0 08/27/2017   GLUCOSE 105 (H) 08/27/2017   CHOL 140 03/27/2017   TRIG 82 03/27/2017   HDL 59 03/27/2017   LDLCALC 65 03/27/2017   ALT 16 08/27/2017   AST 16 08/27/2017   NA 137 08/27/2017   K 4.2 08/27/2017   CL 101 08/27/2017   CREATININE 0.93 08/27/2017   BUN 14 08/27/2017   CO2 30 08/27/2017   TSH 1.39 08/27/2017   PSA 0.98 06/04/2006   INR 0.98 01/15/2014   HGBA1C 5.6 10/26/2014   Ecg today shows NSR with poor R wave progression V1-3. I have personally reviewed and interpreted this study.   Assessment / Plan: 1. CAD.  Known CAD s/p CABG in 2003 and subsequent stenting of native RCA 2008. DES of the proximal RCA in June 2015.  He has not significant angina.  Continue ASA.  2. Mild to moderate AI.  Normal LV size and function. Asymptomatic.   3. Hyperlipidemia- on statin.Excellent control.   4. Back/hip pain.secondary to kyphosis.    Follow up in 6 months.

## 2018-04-22 ENCOUNTER — Encounter: Payer: Self-pay | Admitting: Cardiology

## 2018-04-22 ENCOUNTER — Ambulatory Visit (INDEPENDENT_AMBULATORY_CARE_PROVIDER_SITE_OTHER): Payer: Medicare Other | Admitting: Cardiology

## 2018-04-22 VITALS — BP 110/74 | HR 75 | Ht 62.0 in | Wt 157.6 lb

## 2018-04-22 DIAGNOSIS — I25810 Atherosclerosis of coronary artery bypass graft(s) without angina pectoris: Secondary | ICD-10-CM | POA: Diagnosis not present

## 2018-04-22 DIAGNOSIS — I10 Essential (primary) hypertension: Secondary | ICD-10-CM

## 2018-04-22 DIAGNOSIS — E782 Mixed hyperlipidemia: Secondary | ICD-10-CM

## 2018-04-22 NOTE — Patient Instructions (Addendum)
Continue your current therapy  Follow up in 6 months   

## 2018-06-01 ENCOUNTER — Ambulatory Visit: Payer: Medicare Other | Admitting: Cardiology

## 2018-06-16 ENCOUNTER — Encounter: Payer: Self-pay | Admitting: Neurology

## 2018-06-17 ENCOUNTER — Telehealth: Payer: Self-pay | Admitting: Neurology

## 2018-06-17 NOTE — Telephone Encounter (Signed)
Pt said the pressure needs to be changed. He is waking up during the night. He has tried his wife's CPAP and like the pressure setting on her's. Please call to advise

## 2018-06-17 NOTE — Telephone Encounter (Signed)
I reviewed patient's compliance data from the past 30 days, patient has been compliant with his CPAP of 9 cm, residual AHI borderline at 5.2 but leak is very high. Rather than changing the pressure setting he would benefit from a mask refit as with a lowered leak his AHI may come down as well. Please advise patient to seek an appointment with [facility] for a mask refit.

## 2018-06-17 NOTE — Telephone Encounter (Signed)
Called the patient back an advised him of what Dr Frances Furbish stated. Patient proceeds to tell me that he has used his wife's machine for the last 7 days and his wife has used his machine. So I downloaded both machines data for last 7 days and appears that he when he uses her machine he is reaching pressure of 11-12 cm water pressure. And mask leaks are minimal but on his machine the 9 cm water pressure is working well but she was having leaks for last 7 days. I advised the patient that they can not continue to use each others machines and informed him to restart using his machine starting tonight and recommended that he follow up with [facility] with both him and his wife to make sure their mask are working well for them. Informed him that with the name and data not matching I dont think Dr Frances Furbish will be comfortable with making any changes. Advised Dr Vickey Huger will be back in office on Tuesday the 7th and he needs to restart using his machine and we will compare the downloads and discuss with Dr Vickey Huger and see if she recommends pressure changes. Pt verbalized understanding. 35 minutes was spent on the phone educating and attempting to discuss this matter.

## 2018-06-22 ENCOUNTER — Other Ambulatory Visit: Payer: Self-pay | Admitting: Neurology

## 2018-06-22 DIAGNOSIS — G4733 Obstructive sleep apnea (adult) (pediatric): Secondary | ICD-10-CM

## 2018-06-22 DIAGNOSIS — Z9989 Dependence on other enabling machines and devices: Principal | ICD-10-CM

## 2018-06-22 NOTE — Telephone Encounter (Signed)
I talked with Dr Vickey Huger and was able to inform her of the situation and she reviewed the data. She has increased the pressure on [name]'s machine to 12 cm water pressure. Sent order to [facility] and also advised they educate both patient on the machine and mask refits for both his wife and him.

## 2018-06-28 ENCOUNTER — Other Ambulatory Visit: Payer: Self-pay | Admitting: Neurology

## 2018-06-28 ENCOUNTER — Other Ambulatory Visit: Payer: Self-pay | Admitting: Cardiology

## 2018-06-29 ENCOUNTER — Ambulatory Visit (INDEPENDENT_AMBULATORY_CARE_PROVIDER_SITE_OTHER): Payer: Medicare Other

## 2018-06-29 DIAGNOSIS — Z23 Encounter for immunization: Secondary | ICD-10-CM

## 2018-07-22 NOTE — Telephone Encounter (Signed)
I had called the patient yesterday to offer opening today but since no answer I didn't leave a message and I'm glad that I didn't schedule for today since office is closing. I will call pt if something else opens sooner

## 2018-07-22 NOTE — Telephone Encounter (Signed)
Patient called and said someone called him but did not leave a message. He thinks they may have been calling to work him in for an earlier apt. Please call and advise if there is an earlier apt.

## 2018-07-23 NOTE — Telephone Encounter (Signed)
Called the patient to make him aware that I have a opening on wed at 3:30 pm. Advised the patient to check in at 3 pm. Pt verbalized understanding.

## 2018-07-28 ENCOUNTER — Encounter: Payer: Self-pay | Admitting: Neurology

## 2018-07-28 ENCOUNTER — Ambulatory Visit (INDEPENDENT_AMBULATORY_CARE_PROVIDER_SITE_OTHER): Payer: Medicare Other | Admitting: Neurology

## 2018-07-28 VITALS — BP 132/71 | HR 79 | Ht 62.0 in | Wt 156.0 lb

## 2018-07-28 DIAGNOSIS — G3184 Mild cognitive impairment, so stated: Secondary | ICD-10-CM | POA: Diagnosis not present

## 2018-07-28 MED ORDER — CLONAZEPAM 0.5 MG PO TABS: ORAL_TABLET | ORAL | 0 refills | Status: DC

## 2018-07-28 NOTE — Patient Instructions (Signed)
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
## 2018-08-05 ENCOUNTER — Ambulatory Visit: Payer: Self-pay | Admitting: Neurology

## 2018-08-18 ENCOUNTER — Ambulatory Visit: Payer: Medicare Other | Admitting: Internal Medicine

## 2018-08-18 ENCOUNTER — Other Ambulatory Visit: Payer: Self-pay

## 2018-08-18 ENCOUNTER — Encounter: Payer: Self-pay | Admitting: Internal Medicine

## 2018-08-18 VITALS — BP 136/86 | HR 67 | Temp 97.8°F | Resp 18 | Ht 62.0 in | Wt 152.8 lb

## 2018-08-18 DIAGNOSIS — L723 Sebaceous cyst: Secondary | ICD-10-CM | POA: Diagnosis not present

## 2018-08-18 DIAGNOSIS — I351 Nonrheumatic aortic (valve) insufficiency: Secondary | ICD-10-CM

## 2018-08-18 DIAGNOSIS — G3184 Mild cognitive impairment, so stated: Secondary | ICD-10-CM | POA: Diagnosis not present

## 2018-08-18 DIAGNOSIS — I2510 Atherosclerotic heart disease of native coronary artery without angina pectoris: Secondary | ICD-10-CM

## 2018-08-18 DIAGNOSIS — Z9861 Coronary angioplasty status: Secondary | ICD-10-CM

## 2018-08-18 MED ORDER — ZOSTER VAC RECOMB ADJUVANTED 50 MCG/0.5ML IM SUSR: 0.5000 mL | Freq: Once | INTRAMUSCULAR | 1 refills | Status: AC

## 2018-08-18 NOTE — Patient Instructions (Addendum)
   GO TO THE FRONT DESK Schedule your next appointment for aphysical exam at your convenience        FLONASE 2 sprays at each side of the nose

## 2018-08-18 NOTE — Progress Notes (Signed)
Subjective:    Patient ID: [name], male    DOB: 01-11-1935, 84 y.o.   MRN: [id number]  DOS:  08/18/2018 Type of visit - description: Acute, last seen 08-2017 2-year history of a lump at the posterior neck, it occasionally itches, yesterday he apply some pressure and was able to squeeze out white material.  Complaint of runny nose, chronic issue for few weeks, white/clear discharge, disturbing the use of his CPAP. Note from neurology reviewed Note from cardiology reviewed   Review of Systems Denies fever chills. No chest pain Shortness of breath at baseline  Past Medical History:  Diagnosis Date  . Anginal pain (HCC)   . Arthritis    BACK & SHOULDERS  . CAD S/P percutaneous coronary angioplasty 2003   Dr Antoine Poche: 2003: a) s/p CABG (LIMA-LAD, SVG-Diag);b) 2008: prior PCI to native RCA; c) 12/12/2013: SVG-Diag occluded, patent LIMA, 95% post stent in RCA --> PCI  with 2.75 x 18 mm Xience Alpine DES, residual dRCA-PLA 80=-90%, EF 55-65%  . Diverticulosis   . GERD (gastroesophageal reflux disease)   . Gilbert's syndrome   . Heart murmur   . Hyperlipidemia   . Hypertension   . Osteopenia 2010    T score -1.9 @ hip (femoral neck)  . PUD (peptic ulcer disease)    PMH , ? 1995  . Short-term memory loss   . Shortness of breath     Past Surgical History:  Procedure Laterality Date  . CERVICAL FUSION  1997   Dr Jeral Fruit  . CHOLECYSTECTOMY  2004  . CORONARY ARTERY BYPASS GRAFT  2003   Last catheterization was in August 2008 demonstrated a LIMA to th LAD which was patent, there was an atretic saphenous vein graft to the diagonal, the LAD had a 90% stenosis in the large calcified segment, the diagonal has ostial 70% stenosis, the circumflex had a ramus intermediate with ostial 25% stenosis, the right coronary artery was dominant.  There was an ulcerated 80%-90% stenosis.    . CORONARY STENT PLACEMENT  2008   drug-eluting stent to right coronary artery  . CORONARY STENT  PLACEMENT Right 12/12/2013   RT CORONARY  DES       DR COOPER   . HEMORRHOID SURGERY    . LEFT HEART CATHETERIZATION WITH CORONARY/GRAFT ANGIOGRAM N/A 12/12/2013   Procedure: LEFT HEART CATHETERIZATION WITH Isabel Caprice;  Surgeon: Micheline Chapman, MD;  Location: [facility] CATH LAB;  Service: Cardiovascular;  Laterality: N/A;  . TONSILLECTOMY AND ADENOIDECTOMY    . UPPER GASTROINTESTINAL ENDOSCOPY      Dr Victorino Dike    Social History   Socioeconomic History  . Marital status: Married    Spouse name: [name]  . Number of children: 2  . Years of education: Bachelors  . Highest education level: Not on file  Occupational History  . Occupation: retired     Associate Professor: RETIRED  Social Needs  . Financial resource strain: Not on file  . Food insecurity:    Worry: Not on file    Inability: Not on file  . Transportation needs:    Medical: Not on file    Non-medical: Not on file  Tobacco Use  . Smoking status: Former Smoker    Last attempt to quit: 02/25/1979    Years since quitting: 39.5  . Smokeless tobacco: Never Used  . Tobacco comment: smoked 1955-1980, up to 1 ppd  Substance and Sexual Activity  . Alcohol use: Yes    Comment: 1/2  cup / day of wine or 1 can beer/day  . Drug use: No  . Sexual activity: Never  Lifestyle  . Physical activity:    Days per week: Not on file    Minutes per session: Not on file  . Stress: Not on file  Relationships  . Social connections:    Talks on phone: Not on file    Gets together: Not on file    Attends religious service: Not on file    Active member of club or organization: Not on file    Attends meetings of clubs or organizations: Not on file    Relationship status: Not on file  . Intimate partner violence:    Fear of current or ex partner: Not on file    Emotionally abused: Not on file    Physically abused: Not on file    Forced sexual activity: Not on file  Other Topics Concern  . Not on file  Social History Narrative    Patient is married ([place]) and lives at home with his wife.   Patient has two adult children- daughters x 2  ([name], [place])   Patient is retired.   Patient has a Bachelor's degree.   Patient is right-handed.   Patient drinks 1/2 liter of green tea daily.   Walks once week for exercise   Born in [place]       Allergies as of 08/18/2018      Reactions   Codeine Nausea Only      Medication List       Accurate as of August 18, 2018  3:44 PM. Always use your most recent med list.        aspirin EC 81 MG tablet Take 81 mg by mouth daily.   clonazePAM 0.5 MG tablet Commonly known as:  KLONOPIN TAKE 1 TABLET BY MOUTH AT BEDTIME FOR REM BD AND CAN BE USED PRN FOR VERTIGO   COLON HERBAL CLEANSER Caps Take 1 capsule by mouth daily.   fluticasone 27.5 MCG/SPRAY nasal spray Commonly known as:  VERAMYST Place 1 spray into both nostrils daily. Reported on 09/18/2015   nitroGLYCERIN 0.4 MG SL tablet Commonly known as:  NITROSTAT Place 1 tablet (0.4 mg total) under the tongue every 5 (five) minutes as needed for chest pain.   PROBIOTIC DAILY PO Take 37 mg by mouth daily.   rosuvastatin 20 MG tablet Commonly known as:  CRESTOR TAKE ONE TABLET BY MOUTH ONE TIME DAILY   ZYRTEC ALLERGY 10 MG tablet Generic drug:  cetirizine Take 10 mg by mouth daily.           Objective:   Physical Exam Neck:     BP 136/86 (BP Location: Left Arm, Patient Position: Sitting, Cuff Size: Normal)   Pulse 67   Temp 97.8 F (36.6 C) (Oral)   Resp 18   Ht 5\' 2"  (1.575 m)   Wt 152 lb 12.8 oz (69.3 kg)   SpO2 97%   BMI 27.95 kg/m  General:   Well developed, NAD, BMI noted. HEENT:  Normocephalic . Face symmetric, atraumatic Lungs:  CTA B Normal respiratory effort, no intercostal retractions, no accessory muscle use. Heart: RRR, mild systolic murmur.  No pretibial edema bilaterally  Skin: Not pale. Not jaundice Neurologic:  alert & oriented X3.  Speech normal, gait appropriate  for age and unassisted Psych--  Cognition and judgment appear intact.  Cooperative with normal attention span and concentration.  Behavior appropriate. No anxious or depressed appearing.  Assessment      Assessment HTN Hyperlipidemia MCI: Saw neurology 07-2018, MMSE 25/30. CV: dr [place] --CAD, CABG 2003, stent 2008, cath 2015 [facility]), dc plavix 02-2015 --Aortic regurgitation, mild mod OSA  --- REM sleep d/o ---> CPAP. Clonazepam qhs prn Vertigo CT head 2012 and 2015 nonacute. No help with antivert (03-2017) MSK --DJD --Severe kyphosis ---NCS 4-2017chronic radiucolopathy left C 6-7 and T1 and at L 4-5 and S1, saw neuro, rx conservative treatment  ---Osteopenia, nl vit D 2013, T score -2.0 (04-2016)  GERD, h/o PUD Short term memory impairment   PLAN Sebaceous cyst: Recommend observation, if there is slow growing he is to let me know.  If it grows acutely and associated with redness and pain needs to be seen. Mild cognitive impairment: Note from neurology 07-2018 reviewed CAD, aortic regurgitation: Last visit with cardiology 04/22/2018, he was asymptomatic, no changes made. Allergies?  Clear runny nose without other symptoms for few weeks, recommend trial with Flonase Preventive care: Shingrix discussed, Rx printed RTC at his earliest convenience for CPX.

## 2018-08-18 NOTE — Progress Notes (Signed)
Pre visit review using our clinic review tool, if applicable. No additional management support is needed unless otherwise documented below in the visit note. 

## 2018-08-19 NOTE — Assessment & Plan Note (Signed)
Sebaceous cyst: Recommend observation, if there is slow growing he is to let me know.  If it grows acutely and associated with redness and pain needs to be seen. Mild cognitive impairment: Note from neurology 07-2018 reviewed CAD, aortic regurgitation: Last visit with cardiology 04/22/2018, he was asymptomatic, no changes made. Allergies?  Clear runny nose without other symptoms for few weeks, recommend trial with Flonase Preventive care: Shingrix discussed, Rx printed RTC at his earliest convenience for CPX.

## 2018-09-21 ENCOUNTER — Other Ambulatory Visit: Payer: Self-pay | Admitting: Neurology

## 2018-09-21 ENCOUNTER — Other Ambulatory Visit: Payer: Self-pay

## 2018-09-21 MED ORDER — CLONAZEPAM 0.5 MG PO TABS: ORAL_TABLET | ORAL | 1 refills | Status: DC

## 2018-09-21 MED ORDER — ROSUVASTATIN CALCIUM 20 MG PO TABS: 20.0000 mg | ORAL_TABLET | Freq: Every day | ORAL | 3 refills | Status: DC

## 2018-09-22 ENCOUNTER — Other Ambulatory Visit: Payer: Self-pay

## 2018-09-22 MED ORDER — ROSUVASTATIN CALCIUM 20 MG PO TABS: 20.0000 mg | ORAL_TABLET | Freq: Every day | ORAL | 3 refills | Status: DC

## 2018-10-11 ENCOUNTER — Telehealth: Payer: Self-pay | Admitting: Neurology

## 2018-10-11 NOTE — Telephone Encounter (Signed)
Pt called stating the the pressure is to strong (12) and is needing it turned down. Please advise.

## 2018-10-12 ENCOUNTER — Telehealth: Payer: Self-pay | Admitting: *Deleted

## 2018-10-12 NOTE — Telephone Encounter (Signed)
Called the patient and left a message with the information below. Informed the patient to have his wife help make sure he is wearing the mask correctly as every night it looks as if he is having mask leaks. Advised the patient to also reach out to [facility]) to assist if needed with his mask. The pressure should not be changed until the patient is making sure the AHI is being treated by making sure the mask is worn correctly.

## 2018-10-12 NOTE — Telephone Encounter (Signed)
10/12/18 LMOM @ 11:17 am, JT:[id number] up appointment.

## 2018-10-12 NOTE — Telephone Encounter (Signed)
Pulled the patients data and had Dr Dohmeier review. The patient is due around October 17,2020 for a new machine. The current machine is set at 12 but there is a AHI 17 listed but appears its related to mask leak. Dr Dohmeier would like to make sure the patient is wearing his mask correctly and advising a chin strap as a option to help. If and once the patient is not having mask leaks and the data is better and patient still feels like to much pressure then we could potentially adjust pressure down to 10 cm water pressure.

## 2018-10-13 ENCOUNTER — Ambulatory Visit: Payer: Medicare Other | Admitting: Neurology

## 2018-10-19 ENCOUNTER — Encounter: Payer: Self-pay | Admitting: Cardiology

## 2018-10-22 ENCOUNTER — Telehealth: Payer: Self-pay

## 2018-10-22 ENCOUNTER — Telehealth: Payer: Self-pay | Admitting: Cardiology

## 2018-10-22 NOTE — Telephone Encounter (Signed)
Returned call to daughter  To see if patient was taken to ED per [facility] nurse . See in chart where call was made by family to Cardiology today.  Daughter called [facility] on 10/21/18 regarding elevated BP 175/80 states his highest BP was 212/90. Patient was told by [facility] nurse to go to ED.

## 2018-10-22 NOTE — Telephone Encounter (Signed)
Spoke with daughter who states no other symptoms- other than high blood pressure. 212/92 taken at around 9:30 last night. But has since come down, taking his cholesterol medication, and clozepam to sleep.   152/95 HR 62 now in right arm (other vitals- temp 97, and o2-95) 137/68 was taken left arm about 5 minutes ago.   They called PCP office line last night- advised to go to Urgent care, but it had started to come down so they decided against it and to call us.  Daughter does mention he drank a caffeine drink about 8:30 last night- that he normally drinks earlier. I suggest to patient daughter to not check BP as often- as that can cause increase in BP with anxiety as well. I advised for them to drink water- to stay hydrated, and if BP becomes high again 212/ almost 100 to go ahead and go to urgent care to be evaluated. Patient advised to rest, and to monitor BP and to call if any other concerns.   Advised would route to MD, and nurse to make them aware.

## 2018-10-22 NOTE — Telephone Encounter (Signed)
Review phone note from cardiology, was recommended to monitor BPs at home

## 2018-10-22 NOTE — Telephone Encounter (Signed)
Called spoke to daughter- she was advised that Dr.Jordan agreeable with plan.   Patient verbalized understanding.

## 2018-10-22 NOTE — Telephone Encounter (Signed)
New Message             Patient's daughter [name]) is calling concerning her father's b/p, pls call and advise

## 2018-10-22 NOTE — Telephone Encounter (Signed)
Agree looks like BP improved. Just monitor for now  Cesilia Shinn Swaziland MD, [facility]

## 2018-10-26 ENCOUNTER — Telehealth: Payer: Self-pay | Admitting: Cardiology

## 2018-10-26 NOTE — Telephone Encounter (Signed)
New Message     Pts daughter is calling and would like to have her dad seen if possible. She says he is still having elevated bp   Please call

## 2018-10-26 NOTE — Telephone Encounter (Signed)
SPOKE WITH DAUGHTER AND PT'S B/P CREPT BACK UP TO 160-170'S AND ARE REQUESTING EARLIER APPT APPT MADE WITH HAO MENG PA FOR Thursday 10/28/18 AT 1:30 PM ./CY PER DAUGHTER PT HAS CUT OUT ALL CAFFEINE AND B/P HAS STILL CREPT BACK UP .Zack Seal

## 2018-10-27 ENCOUNTER — Telehealth: Payer: Self-pay | Admitting: Physician Assistant

## 2018-10-28 ENCOUNTER — Telehealth (INDEPENDENT_AMBULATORY_CARE_PROVIDER_SITE_OTHER): Payer: Medicare Other | Admitting: Physician Assistant

## 2018-10-28 ENCOUNTER — Encounter: Payer: Self-pay | Admitting: Physician Assistant

## 2018-10-28 ENCOUNTER — Telehealth: Payer: Self-pay

## 2018-10-28 VITALS — BP 111/60 | HR 72 | Ht 63.0 in | Wt 150.0 lb

## 2018-10-28 DIAGNOSIS — I2510 Atherosclerotic heart disease of native coronary artery without angina pectoris: Secondary | ICD-10-CM

## 2018-10-28 DIAGNOSIS — R0600 Dyspnea, unspecified: Secondary | ICD-10-CM | POA: Diagnosis not present

## 2018-10-28 DIAGNOSIS — E785 Hyperlipidemia, unspecified: Secondary | ICD-10-CM

## 2018-10-28 DIAGNOSIS — R0989 Other specified symptoms and signs involving the circulatory and respiratory systems: Secondary | ICD-10-CM

## 2018-10-28 NOTE — Telephone Encounter (Signed)
Virtual Visit Pre-Appointment Phone Call  "(Name), I am calling you today to discuss your upcoming appointment. We are currently trying to limit exposure to the virus that causes COVID-19 by seeing patients at home rather than in the office."  1. "What is the BEST phone number to call the day of the visit?" - include this in appointment notes  2. "Do you have or have access to (through a family member/friend) a smartphone with video capability that we can use for your visit?" a. If yes - list this number in appt notes as "cell" (if different from BEST phone #) and list the appointment type as a VIDEO visit in appointment notes b. If no - list the appointment type as a PHONE visit in appointment notes  3. Confirm consent - "In the setting of the current Covid19 crisis, you are scheduled for a (phone or video) visit with your provider on 10/28/2018 at 1:30PM.  Just as we do with many in-office visits, in order for you to participate in this visit, we must obtain consent.  If you'd like, I can send this to your mychart (if signed up) or email for you to review.  Otherwise, I can obtain your verbal consent now.  All virtual visits are billed to your insurance company just like a normal visit would be.  By agreeing to a virtual visit, we'd like you to understand that the technology does not allow for your provider to perform an examination, and thus may limit your provider's ability to fully assess your condition. If your provider identifies any concerns that need to be evaluated in person, we will make arrangements to do so.  Finally, though the technology is pretty good, we cannot assure that it will always work on either your or our end, and in the setting of a video visit, we may have to convert it to a phone-only visit.  In either situation, we cannot ensure that we have a secure connection.  Are you willing to proceed?" STAFF: Did the patient verbally acknowledge consent to telehealth visit?  Document YES/NO here: YES  4. Advise patient to be prepared - "Two hours prior to your appointment, go ahead and check your blood pressure, pulse, oxygen saturation, and your weight (if you have the equipment to check those) and write them all down. When your visit starts, your provider will ask you for this information. If you have an Apple Watch or Kardia device, please plan to have heart rate information ready on the day of your appointment. Please have a pen and paper handy nearby the day of the visit as well."  5. Give patient instructions for MyChart download to smartphone OR Doximity/Doxy.me as below if video visit (depending on what platform provider is using)  6. Inform patient they will receive a phone call 15 minutes prior to their appointment time (may be from unknown caller ID) so they should be prepared to answer    TELEPHONE CALL NOTE  [name] has been deemed a candidate for a follow-up tele-health visit to limit community exposure during the Covid-19 pandemic. I spoke with the patient via phone to ensure availability of phone/video source, confirm preferred email & phone number, and discuss instructions and expectations.  I reminded [name] to be prepared with any vital sign and/or heart rhythm information that could potentially be obtained via home monitoring, at the time of his visit. I reminded [name] to expect a phone call prior to  his visit.  Dorris Fetch, CMA 10/28/2018 1:30 PM   INSTRUCTIONS FOR DOWNLOADING THE MYCHART APP TO SMARTPHONE  - The patient must first make sure to have activated MyChart and know their login information - If Apple, go to [employer] and type in MyChart in the search bar and download the app. If Android, ask patient to go to [employer] and type in [place] in the search bar and download the app. The app is free but as with any other app downloads, their phone may require them to verify saved payment information  or Apple/Android password.  - The patient will need to then log into the app with their MyChart username and password, and select [facility] as their healthcare provider to link the account. When it is time for your visit, go to the MyChart app, find appointments, and click Begin Video Visit. Be sure to Select Allow for your device to access the Microphone and Camera for your visit. You will then be connected, and your provider will be with you shortly.  **If they have any issues connecting, or need assistance please contact MyChart service desk [phone])**  **If using a computer, in order to ensure the best quality for their visit they will need to use either of the following Internet Browsers: [employer] or Google Chrome**  IF USING DOXIMITY or DOXY.ME - The patient will receive a link just prior to their visit by text.     FULL LENGTH CONSENT FOR TELE-HEALTH VISIT   I hereby voluntarily request, consent and authorize [facility] and its employed or contracted physicians, physician assistants, nurse practitioners or other licensed health care professionals (the Practitioner), to provide me with telemedicine health care services (the "Services") as deemed necessary by the treating Practitioner. I acknowledge and consent to receive the Services by the Practitioner via telemedicine. I understand that the telemedicine visit will involve communicating with the Practitioner through live audiovisual communication technology and the disclosure of certain medical information by electronic transmission. I acknowledge that I have been given the opportunity to request an in-person assessment or other available alternative prior to the telemedicine visit and am voluntarily participating in the telemedicine visit.  I understand that I have the right to withhold or withdraw my consent to the use of telemedicine in the course of my care at any time, without affecting my right to future  care or treatment, and that the Practitioner or I may terminate the telemedicine visit at any time. I understand that I have the right to inspect all information obtained and/or recorded in the course of the telemedicine visit and may receive copies of available information for a reasonable fee.  I understand that some of the potential risks of receiving the Services via telemedicine include:  [name] Delay or interruption in medical evaluation due to technological equipment failure or disruption; . Information transmitted may not be sufficient (e.g. poor resolution of images) to allow for appropriate medical decision making by the Practitioner; and/or  . In rare instances, security protocols could fail, causing a breach of personal health information.  Furthermore, I acknowledge that it is my responsibility to provide information about my medical history, conditions and care that is complete and accurate to the best of my ability. I acknowledge that Practitioner's advice, recommendations, and/or decision may be based on factors not within their control, such as incomplete or inaccurate data provided by me or distortions of diagnostic images or specimens that may result from electronic transmissions. I  understand that the practice of medicine is not an exact science and that Practitioner makes no warranties or guarantees regarding treatment outcomes. I acknowledge that I will receive a copy of this consent concurrently upon execution via email to the email address I last provided but may also request a printed copy by calling the office of [facility].    I understand that my insurance will be billed for this visit.   I have read or had this consent read to me. . I understand the contents of this consent, which adequately explains the benefits and risks of the Services being provided via telemedicine.  . I have been provided ample opportunity to ask questions regarding this consent and the Services and have  had my questions answered to my satisfaction. . I give my informed consent for the services to be provided through the use of telemedicine in my medical care  By participating in this telemedicine visit I agree to the above.

## 2018-10-28 NOTE — Progress Notes (Signed)
Virtual Visit via Video Note   This visit type was conducted due to national recommendations for restrictions regarding the COVID-19 Pandemic (e.g. social distancing) in an effort to limit this patient's exposure and mitigate transmission in our community.  Due to his co-morbid illnesses, this patient is at least at moderate risk for complications without adequate follow up.  This format is felt to be most appropriate for this patient at this time.  All issues noted in this document were discussed and addressed.  A limited physical exam was performed with this format.  Please refer to the patient's chart for his consent to telehealth for [facility].   Date:  10/28/2018   ID:  [name], DOB 1935-02-09, MRN [id number]  Patient Location: Home Provider Location: Home  PCP:  [name], MD  Cardiologist:  Peter Swaziland, MD  Electrophysiologist:  None   Evaluation Performed:  Follow-Up Visit  Chief Complaint:  Dyspnea and high blood pressure  History of Present Illness:    [name] is a 84 y.o. male with past medical history of [facility] Lone syndrome, hypertension, hyperlipidemia, CAD s/p CABG in 2003 by Dr. Cornelius Moras with LIMA to LAD and SVG to diagonal.  He had DES placed in proximal RCA in 2008.  He was noted to have a atretic SVG to diagonal but it was felt that antegrade flow in the diagonal was okay.  Last Myoview in November 2012 showed a fixed apical defect without ischemia, EF 62%.  Last cardiac catheterization in Jun 2015 showed patent LIMA to LAD, occluded SVG to diagonal, severe stenosis in the RCA was treated with DES.  Echocardiogram in September 2015 noted mild to moderate AI.  Patient was last seen by Dr. Swaziland in November 2019 at which time he was doing well.  Patient was contacted today via doximity video conference visit.  He says last week he had 2 days where his systolic blood pressure spiked to over 200.  He has never had any blood pressure issues in the past and  does not take any blood pressure medication even though hypertension is listed as part of his past medical history.  His blood pressure this morning actually improved back down to 110.  Despite his labile blood pressure, his blood pressure normally stays somewhere around 110-160s.  I recommended he continue to follow-up with twice daily blood pressure check.  He will need to watch his diet, cut back on salt, cut back on caffeine.  He does not drink any alcohol.  His wife cooks for him.  His daughter has also recently moved from [place] to stay with him.  He has been noticing increasing dyspnea recently however no chest pain that is reminiscent of the past PCI.  I recommended a echocardiogram in 2 months.  For follow-up with Dr. Swaziland.  I will call him in 1 week to follow-up on his blood pressure.  If his blood pressure can be managed more conservatively, then no further medication adjustment is needed, if his blood pressure remains labile, then I will add a as needed dose of hydralazine.  Note, he did not have any symptom such as dizziness, blurred vision, headache, chest pain or shortness of breath last week when he was found to have very significant hypertension.  In fact he says it was his daughter who was feeling bad at that day and he just happened to stand around and his daughter decided to check his blood pressure as well.  The patient  does not have symptoms concerning for COVID-19 infection (fever, chills, cough, or new shortness of breath).    Past Medical History:  Diagnosis Date   Anginal pain (HCC)    Arthritis    BACK & SHOULDERS   CAD S/P percutaneous coronary angioplasty 2003   Dr Antoine PocheHochrein: 2003: a) s/p CABG (LIMA-LAD, SVG-Diag);b) 2008: prior PCI to native RCA; c) 12/12/2013: SVG-Diag occluded, patent LIMA, 95% post stent in RCA --> PCI  with 2.75 x 18 mm Xience Alpine DES, residual dRCA-PLA 80=-90%, EF 55-65%   Diverticulosis    GERD (gastroesophageal reflux disease)     Gilbert's syndrome    Heart murmur    Hyperlipidemia    Hypertension    Osteopenia 2010    T score -1.9 @ hip (femoral neck)   PUD (peptic ulcer disease)    PMH , ? 1995   Short-term memory loss    Shortness of breath    Past Surgical History:  Procedure Laterality Date   CERVICAL FUSION  1997   Dr Jeral FruitBotero   CHOLECYSTECTOMY  2004   CORONARY ARTERY BYPASS GRAFT  2003   Last catheterization was in August 2008 demonstrated a LIMA to th LAD which was patent, there was an atretic saphenous vein graft to the diagonal, the LAD had a 90% stenosis in the large calcified segment, the diagonal has ostial 70% stenosis, the circumflex had a ramus intermediate with ostial 25% stenosis, the right coronary artery was dominant.  There was an ulcerated 80%-90% stenosis.     CORONARY STENT PLACEMENT  2008   drug-eluting stent to right coronary artery   CORONARY STENT PLACEMENT Right 12/12/2013   RT CORONARY  DES       DR COOPER    HEMORRHOID SURGERY     LEFT HEART CATHETERIZATION WITH CORONARY/GRAFT ANGIOGRAM N/A 12/12/2013   Procedure: LEFT HEART CATHETERIZATION WITH Isabel CapriceORONARY/GRAFT ANGIOGRAM;  Surgeon: Micheline ChapmanMichael D Cooper, MD;  [facility];  Service: Cardiovascular;  Laterality: N/A;   TONSILLECTOMY AND ADENOIDECTOMY     UPPER GASTROINTESTINAL ENDOSCOPY      Dr Victorino DikeSam [place]     Current Meds  Medication Sig   aspirin EC 81 MG tablet Take 81 mg by mouth daily.   cetirizine (ZYRTEC ALLERGY) 10 MG tablet Take 10 mg by mouth daily.   clonazePAM (KLONOPIN) 0.5 MG tablet TAKE 1 TABLET BY MOUTH AT BEDTIME FOR REM BD AND CAN BE USED PRN FOR VERTIGO   Misc Natural Products (COLON HERBAL CLEANSER) CAPS Take 1 capsule by mouth daily.   Probiotic Product (PROBIOTIC DAILY PO) Take 37 mg by mouth daily.   rosuvastatin (CRESTOR) 20 MG tablet Take 1 tablet (20 mg total) by mouth daily.     Allergies:   Codeine   Social History   Tobacco Use   Smoking status: Former Smoker    Last  attempt to quit: 02/25/1979    Years since quitting: 39.6   Smokeless tobacco: Never Used   Tobacco comment: smoked 1955-1980, up to 1 ppd  Substance Use Topics   Alcohol use: Yes    Comment: 1/2 cup / day of wine or 1 can beer/day   Drug use: No     Family Hx: The patient's family history includes Cancer in his father; Diabetes in his sister; Hypertension in his maternal grandmother; Osteoporosis in his sister; Peripheral vascular disease in his maternal grandmother; Uterine cancer in his daughter. There is no history of Stroke, Heart attack, Colon cancer, or Prostate cancer.  ROS:   Please see the history of present illness.     All other systems reviewed and are negative.   Prior CV studies:   The following studies were reviewed today:  Echo 02/16/2014 LV EF: 55% -  60% Study Conclusions  - Left ventricle: The cavity size was normal. Systolic function was normal. The estimated ejection fraction was in the range of 55% to 60%. Wall motion was normal; there were no regional wall motion abnormalities. - Aortic valve: Moderately calcified annulus. Probably trileaflet. Moderate calcification, consistent with sclerosis. There was mild to moderate regurgitation.  Labs/Other Tests and Data Reviewed:    EKG:  An ECG dated 04/23/2018 was personally reviewed today and demonstrated:  Normal sinus rhythm with poor R wave progression anterior leads.  Recent Labs: No results found for requested labs within last 8760 hours.   Recent Lipid Panel Lab Results  Component Value Date/Time   CHOL 140 03/27/2017 04:14 PM   TRIG 82 03/27/2017 04:14 PM   HDL 59 03/27/2017 04:14 PM   CHOLHDL 3 10/23/2015 11:28 AM   LDLCALC 65 03/27/2017 04:14 PM    Wt Readings from Last 3 Encounters:  10/28/18 150 lb (68 kg)  08/18/18 152 lb 12.8 oz (69.3 kg)  07/28/18 156 lb (70.8 kg)     Objective:    Vital Signs:  BP 111/60    Pulse 72    Ht  (1.6 m)    Wt 150 lb (68 kg)    BMI  26.57 kg/m    VITAL SIGNS:  reviewed  ASSESSMENT & PLAN:    1. Dyspnea: I recommended a echocardiogram in 2 months prior to his next follow-up with Dr. Swaziland.  2. Labile hypertension: Despite the fact patient carries a prior diagnosis of hypertension, however he has not really been on any blood pressure medication.  His blood pressure is normally self controlled.  In the past 2 weeks, he has had 2 episodes where his systolic blood pressure spiked over 200 mmHg range.  However this later quickly calmed back down to normal.  I recommended conservative management by cutting back on caffeine and salt.  I will contact him in 1 week to reassess the blood pressure.  If the blood pressure remains labile, I may consider a renal artery Doppler and medication adjustment.  3. CAD s/p CABG: Denies any recent anginal symptoms.  Continue aspirin and Crestor  4. Hyperlipidemia: On Crestor 20 mg daily.   COVID-19 Education: The signs and symptoms of COVID-19 were discussed with the patient and how to seek care for testing (follow up with PCP or arrange E-visit).  The importance of social distancing was discussed today.  Time:   Today, I have spent 27 minutes with the patient with telehealth technology discussing the above problems.     Medication Adjustments/Labs and Tests Ordered: Current medicines are reviewed at length with the patient today.  Concerns regarding medicines are outlined above.   Tests Ordered: No orders of the defined types were placed in this encounter.   Medication Changes: No orders of the defined types were placed in this encounter.   Disposition:  Follow up in 2 month(s)  Signed, Azalee Course, PA  10/28/2018 1:59 PM    [facility]

## 2018-10-28 NOTE — Patient Instructions (Addendum)
Medication Instructions:   Your physician recommends that you continue on your current medications as directed. Please refer to the Current Medication list given to you today.  If you need a refill on your cardiac medications before your next appointment, please call your pharmacy.   Lab work:  NONE ordered at this time of appointment   If you have labs (blood work) drawn today and your tests are completely normal, you will receive your results only by: [name] MyChart Message (if you have MyChart) OR . A paper copy in the mail If you have any lab test that is abnormal or we need to change your treatment, we will call you to review the results.  Testing/Procedures:  Your physician has requested that you have an echocardiogram. Echocardiography is a painless test that uses sound waves to create images of your heart. It provides your doctor with information about the size and shape of your heart and how well your heart's chambers and valves are working. This procedure takes approximately one hour. There are no restrictions for this procedure.   Follow-Up: At [facility], you and your health needs are our priority.  As part of our continuing mission to provide you with exceptional heart care, we have created designated Provider Care Teams.  These Care Teams include your primary Cardiologist (physician) and Advanced Practice Providers (APPs -  Physician Assistants and Nurse Practitioners) who all work together to provide you with the care you need, when you need it. You will need a follow up appointment in 2 months.  Please call our office 2 months in advance to schedule this appointment.  You may see Peter Swaziland, MD or one of the following Advanced Practice Providers on your designated Care Team: [place], [place] . Micah Flesher, PA-C  Any Other Special Instructions Will Be Listed Below (If Applicable).   Continue to check blood pressure 2 times a day

## 2018-11-17 ENCOUNTER — Telehealth: Payer: Medicare Other | Admitting: Physician Assistant

## 2018-12-14 NOTE — Telephone Encounter (Signed)
Opened in error

## 2018-12-27 ENCOUNTER — Other Ambulatory Visit: Payer: Self-pay | Admitting: Neurology

## 2018-12-27 MED ORDER — CLONAZEPAM 0.5 MG PO TABS: ORAL_TABLET | ORAL | 1 refills | Status: DC

## 2019-01-10 ENCOUNTER — Encounter: Payer: Self-pay | Admitting: Internal Medicine

## 2019-01-11 ENCOUNTER — Telehealth: Payer: Self-pay | Admitting: Neurology

## 2019-01-11 NOTE — Telephone Encounter (Signed)
I reached out to the pt's daughter and we discussed 01/26/19 appt. I advised Dr. Brett Fairy is currently not doing virtual visits, but her NP would be able to complete a virtual visit.  Pt wanted to confirm if his sleep study was due? I advised based off the note from Feb of 2020 at his 6-7 month f/u we could re order his sleep study and after the results were received changes to the machine maybe recommended. Pt is hesitant at this time for a visit and sleep study due to covid. He and his daughter will discuss options and call back tomorrow to advise on how he would like to proceed.

## 2019-01-11 NOTE — Telephone Encounter (Signed)
Pts daughter Nevin Bloodgood is calling in wanting to know if appt on 08/12 can be a VV

## 2019-01-26 ENCOUNTER — Ambulatory Visit: Payer: Medicare Other | Admitting: Neurology

## 2019-03-02 ENCOUNTER — Telehealth: Payer: Self-pay | Admitting: Internal Medicine

## 2019-03-02 NOTE — Telephone Encounter (Signed)
Pt daughter [name] is calling and would like new rx for seat life mechanism to help him get out of his chair. Please send rx thorough my chart

## 2019-03-02 NOTE — Telephone Encounter (Signed)
Needs appt please

## 2019-03-02 NOTE — Telephone Encounter (Signed)
Sent mychart msg to pt to call for appt.

## 2019-03-07 ENCOUNTER — Ambulatory Visit (INDEPENDENT_AMBULATORY_CARE_PROVIDER_SITE_OTHER): Payer: Medicare Other | Admitting: Internal Medicine

## 2019-03-07 DIAGNOSIS — M8949 Other hypertrophic osteoarthropathy, multiple sites: Secondary | ICD-10-CM

## 2019-03-07 DIAGNOSIS — M159 Polyosteoarthritis, unspecified: Secondary | ICD-10-CM

## 2019-03-07 DIAGNOSIS — J3089 Other allergic rhinitis: Secondary | ICD-10-CM

## 2019-03-07 DIAGNOSIS — M150 Primary generalized (osteo)arthritis: Secondary | ICD-10-CM

## 2019-03-07 DIAGNOSIS — M40209 Unspecified kyphosis, site unspecified: Secondary | ICD-10-CM

## 2019-03-07 MED ORDER — MONTELUKAST SODIUM 10 MG PO TABS: 10.0000 mg | ORAL_TABLET | Freq: Every day | ORAL | 0 refills | Status: DC

## 2019-03-07 MED ORDER — AZELASTINE HCL 0.1 % NA SOLN: 2.0000 | Freq: Two times a day (BID) | NASAL | 0 refills | Status: DC

## 2019-03-07 NOTE — Assessment & Plan Note (Signed)
Allergies: The patient has a chronic cough, the patient and the daughter believe is due to allergies.  He is currently taking over-the-counter antihistaminic but is not taking any other allergy medication. They request montelukast which another family member uses successfully. Plan: Consistent use of Astelin, prescription sent.  OTC Flonase.  OTC antihistaminic.  Start Singulair.  Call if not better. DJD, severe kyphosis.  Request seat lift mechanism.  We will send a prescription. Constipation: see ROS, resolved, encourage fluids, fiber rich diet and Metamucil if needed Due for physical exam, the patient's daughter is quite reluctant for  him to come d/t Covid. Recommend to come within few months.

## 2019-03-07 NOTE — Progress Notes (Signed)
Subjective:    Patient ID: [name], male    DOB: Apr 11, 1935, 84 y.o.   MRN: [id number]  DOS:  03/07/2019 Type of visit - description: Virtual Visit via Video Note  I connected with@   by a video enabled telemedicine application and verified that I am speaking with the correct person using two identifiers.   THIS ENCOUNTER IS A VIRTUAL VISIT DUE TO COVID-19 - PATIENT WAS NOT SEEN IN THE OFFICE. PATIENT HAS CONSENTED TO VIRTUAL VISIT / TELEMEDICINE VISIT   Location of patient: home  Location of provider: office  I discussed the limitations of evaluation and management by telemedicine and the availability of in person appointments. The patient expressed understanding and agreed to proceed.  History of Present Illness: Acute Here at the request of his daughter [name] She reports [name] has  a chronic cough, the patient and she believe is due to postnasal drip. from time to time cough up clear sputum which is actually mucus pooled in his throat.  Also request a seat lift mechanism, has a hard time getting up from the chair.  Review of Systems No fever chills No chest pain no difficulty breathing No weight loss No wheezing + Postnasal dripping  Past Medical History:  Diagnosis Date  . Anginal pain ([place])   . Arthritis    BACK & SHOULDERS  . CAD S/P percutaneous coronary angioplasty 2003   Dr Percival Spanish: 2003: a) s/p CABG (LIMA-LAD, SVG-Diag);b) 2008: prior PCI to native RCA; c) 12/12/2013: SVG-Diag occluded, patent LIMA, 95% post stent in RCA --> PCI  with 2.75 x 18 mm Xience Alpine DES, residual dRCA-PLA 80=-90%, EF 55-65%  . Diverticulosis   . GERD (gastroesophageal reflux disease)   . Gilbert's syndrome   . Heart murmur   . Hyperlipidemia   . Hypertension   . Osteopenia 2010    T score -1.9 @ hip (femoral neck)  . PUD (peptic ulcer disease)    PMH , ? 1995  . Short-term memory loss   . Shortness of breath     Past Surgical History:  Procedure Laterality Date  .  CERVICAL FUSION  1997   Dr Joya Salm  . CHOLECYSTECTOMY  2004  . CORONARY ARTERY BYPASS GRAFT  2003   Last catheterization was in August 2008 demonstrated a LIMA to th LAD which was patent, there was an atretic saphenous vein graft to the diagonal, the LAD had a 90% stenosis in the large calcified segment, the diagonal has ostial 70% stenosis, the circumflex had a ramus intermediate with ostial 25% stenosis, the right coronary artery was dominant.  There was an ulcerated 80%-90% stenosis.    . CORONARY STENT PLACEMENT  2008   drug-eluting stent to right coronary artery  . CORONARY STENT PLACEMENT Right 12/12/2013   RT CORONARY  DES       DR COOPER   . HEMORRHOID SURGERY    . LEFT HEART CATHETERIZATION WITH CORONARY/GRAFT ANGIOGRAM N/A 12/12/2013   Procedure: LEFT HEART CATHETERIZATION WITH Beatrix Fetters;  Surgeon: Blane Ohara, MD;  Location: [facility] CATH LAB;  Service: Cardiovascular;  Laterality: N/A;  . TONSILLECTOMY AND ADENOIDECTOMY    . UPPER GASTROINTESTINAL ENDOSCOPY      Dr Lyla Son    Social History   Socioeconomic History  . Marital status: Married    Spouse name: [name]  . Number of children: 2  . Years of education: Bachelors  . Highest education level: Not on file  Occupational History  . Occupation:  retired     Associate Professormployer: RETIRED  Social Needs  . Financial resource strain: Not on file  . Food insecurity    Worry: Not on file    Inability: Not on file  . Transportation needs    Medical: Not on file    Non-medical: Not on file  Tobacco Use  . Smoking status: Former Smoker    Quit date: 02/25/1979    Years since quitting: 40.0  . Smokeless tobacco: Never Used  . Tobacco comment: smoked 1955-1980, up to 1 ppd  Substance and Sexual Activity  . Alcohol use: Yes    Comment: 1/2 cup / day of wine or 1 can beer/day  . Drug use: No  . Sexual activity: Never  Lifestyle  . Physical activity    Days per week: Not on file    Minutes per session: Not on file   . Stress: Not on file  Relationships  . Social Musicianconnections    Talks on phone: Not on file    Gets together: Not on file    Attends religious service: Not on file    Active member of club or organization: Not on file    Attends meetings of clubs or organizations: Not on file    Relationship status: Not on file  . Intimate partner violence    Fear of current or ex partner: Not on file    Emotionally abused: Not on file    Physically abused: Not on file    Forced sexual activity: Not on file  Other Topics Concern  . Not on file  Social History Narrative   Patient is married ([place]) and lives at home with his wife.   Patient has two adult children- daughters x 2  ([name], [name])   Patient is retired.   Patient has a Bachelor's degree.   Patient is right-handed.   Patient drinks 1/2 liter of green tea daily.   Walks once week for exercise   [place]       Allergies as of 03/07/2019      Reactions   Codeine Nausea Only      Medication List       Accurate as of March 07, 2019  8:33 PM. If you have any questions, ask your nurse or doctor.        aspirin EC 81 MG tablet Take 81 mg by mouth daily.   azelastine 0.1 % nasal spray Commonly known as: ASTELIN Place 2 sprays into both nostrils 2 (two) times daily. Use in each nostril as directed   clonazePAM 0.5 MG tablet Commonly known as: KLONOPIN TAKE 1 TABLET BY MOUTH AT BEDTIME FOR REM BD AND CAN BE USED PRN FOR VERTIGO   Colon Herbal Cleanser Caps Take 1 capsule by mouth daily.   fluticasone 27.5 MCG/SPRAY nasal spray Commonly known as: VERAMYST Place 1 spray into both nostrils as needed. Reported on 09/18/2015   montelukast 10 MG tablet Commonly known as: SINGULAIR Take 1 tablet (10 mg total) by mouth at bedtime.   nitroGLYCERIN 0.4 MG SL tablet Commonly known as: NITROSTAT Place 1 tablet (0.4 mg total) under the tongue every 5 (five) minutes as needed for chest pain.   PROBIOTIC DAILY PO  Take 37 mg by mouth daily.   rosuvastatin 20 MG tablet Commonly known as: CRESTOR Take 1 tablet (20 mg total) by mouth daily.   ZyrTEC Allergy 10 MG tablet Generic drug: cetirizine Take 10 mg by mouth daily.  Objective:   Physical Exam There were no vitals taken for this visit. This is a virtual video visit, he is alert oriented x3, no apparent distress    Assessment     Assessment HTN Hyperlipidemia MCI: Saw neurology 07-2018, MMSE 25/30. CV: dr [place] --CAD, CABG 2003, stent 2008, cath 2015 [facility]), dc plavix 02-2015 --Aortic regurgitation, mild mod OSA  --- REM sleep d/o ---> CPAP. Clonazepam qhs prn Vertigo CT head 2012 and 2015 nonacute. No help with antivert (03-2017) MSK --DJD --Severe kyphosis ---NCS 4-2017chronic radiucolopathy left C 6-7 and T1 and at L 4-5 and S1, saw neuro, rx conservative treatment  ---Osteopenia, nl vit D 2013, T score -2.0 (04-2016)  GERD, h/o PUD Short term memory impairment   PLAN Allergies: The patient has a chronic cough, the patient and the daughter believe is due to allergies.  He is currently taking over-the-counter antihistaminic but is not taking any other allergy medication. They request montelukast which another family member uses successfully. Plan: Consistent use of Astelin, prescription sent.  OTC Flonase.  OTC antihistaminic.  Start Singulair.  Call if not better. DJD, severe kyphosis.  Request seat lift mechanism.  We will send a prescription. Constipation: see ROS, resolved, encourage fluids, fiber rich diet and Metamucil if needed Due for physical exam, the patient's daughter is quite reluctant for  him to come d/t Covid. Recommend to come within few months.   I discussed the assessment and treatment plan with the patient. The patient was provided an opportunity to ask questions and all were answered. The patient agreed with the plan and demonstrated an understanding of the instructions.   The patient was  advised to call back or seek an in-person evaluation if the symptoms worsen or if the condition fails to improve as anticipated.

## 2019-03-22 ENCOUNTER — Ambulatory Visit: Payer: Medicare Other | Admitting: Neurology

## 2019-05-26 ENCOUNTER — Other Ambulatory Visit: Payer: Self-pay | Admitting: Internal Medicine

## 2019-08-08 ENCOUNTER — Ambulatory Visit: Payer: Medicare Other | Admitting: Neurology

## 2019-08-31 ENCOUNTER — Other Ambulatory Visit: Payer: Self-pay

## 2019-08-31 ENCOUNTER — Other Ambulatory Visit: Payer: Self-pay | Admitting: Internal Medicine

## 2019-09-01 ENCOUNTER — Other Ambulatory Visit: Payer: Self-pay | Admitting: Internal Medicine

## 2019-09-01 MED ORDER — MONTELUKAST SODIUM 10 MG PO TABS: 10.0000 mg | ORAL_TABLET | Freq: Every day | ORAL | 0 refills | Status: DC

## 2019-09-02 ENCOUNTER — Ambulatory Visit: Payer: Medicare Other | Admitting: Internal Medicine

## 2019-09-02 ENCOUNTER — Other Ambulatory Visit: Payer: Self-pay

## 2019-09-02 ENCOUNTER — Encounter: Payer: Self-pay | Admitting: Internal Medicine

## 2019-09-02 ENCOUNTER — Ambulatory Visit (HOSPITAL_BASED_OUTPATIENT_CLINIC_OR_DEPARTMENT_OTHER)
Admission: RE | Admit: 2019-09-02 | Discharge: 2019-09-02 | Disposition: A | Discharge status: Home / Self Care | Payer: Medicare Other | Source: Ambulatory Visit | Attending: Internal Medicine | Admitting: Internal Medicine

## 2019-09-02 VITALS — BP 172/81 | HR 76 | Temp 96.8°F | Resp 18 | Ht 62.0 in | Wt 154.2 lb

## 2019-09-02 DIAGNOSIS — M8580 Other specified disorders of bone density and structure, unspecified site: Secondary | ICD-10-CM | POA: Diagnosis not present

## 2019-09-02 DIAGNOSIS — Z0000 Encounter for general adult medical examination without abnormal findings: Secondary | ICD-10-CM

## 2019-09-02 DIAGNOSIS — E782 Mixed hyperlipidemia: Secondary | ICD-10-CM

## 2019-09-02 DIAGNOSIS — R0600 Dyspnea, unspecified: Secondary | ICD-10-CM | POA: Diagnosis not present

## 2019-09-02 DIAGNOSIS — I2510 Atherosclerotic heart disease of native coronary artery without angina pectoris: Secondary | ICD-10-CM | POA: Diagnosis not present

## 2019-09-02 DIAGNOSIS — I351 Nonrheumatic aortic (valve) insufficiency: Secondary | ICD-10-CM

## 2019-09-02 DIAGNOSIS — R0609 Other forms of dyspnea: Secondary | ICD-10-CM

## 2019-09-02 DIAGNOSIS — Z9861 Coronary angioplasty status: Secondary | ICD-10-CM

## 2019-09-02 MED ORDER — MONTELUKAST SODIUM 10 MG PO TABS: 10.0000 mg | ORAL_TABLET | Freq: Every day | ORAL | 3 refills | Status: DC

## 2019-09-02 MED ORDER — AZELASTINE HCL 0.1 % NA SOLN: 2.0000 | Freq: Two times a day (BID) | NASAL | 3 refills | Status: DC

## 2019-09-02 NOTE — Progress Notes (Signed)
Pre visit review using our clinic review tool, if applicable. No additional management support is needed unless otherwise documented below in the visit note. 

## 2019-09-02 NOTE — Patient Instructions (Addendum)
Please schedule Medicare Wellness with [name].   Continue checking your blood pressures BP GOAL is between 110/65 and  135/85. If it is consistently higher or lower, let me know     GO TO THE LAB : Get the blood work     GO TO THE FRONT DESK, please reschedule your appointments Come back for a checkup in 3 months   STOP BY THE FIRST FLOOR:  get the XR

## 2019-09-02 NOTE — Progress Notes (Signed)
Subjective:    Patient ID: [name], male    DOB: Jan 10, 1935, 85 y.o.   MRN: [id number]  DOS:  09/02/2019 Type of visit - description: CPX Here with his daughter for CPX  He also complained of dyspnea on exertion, typically DOE after walking around 30 to 40 yards. Typically no DOE with activities of daily living within his house. BP today is elevated, reportedly normal at home.    Wt Readings from Last 3 Encounters:  09/02/19 154 lb 4 oz (70 kg)  10/28/18 150 lb (68 kg)  08/18/18 152 lb 12.8 oz (69.3 kg)     Review of Systems Denies fever, chills or weight loss.  No headache No chest pain No lower extremity edema No nausea, vomiting, diarrhea.  He does have constipation. Allergies are under better control lately.  Other than above, a 14 point review of systems is negative      Past Medical History:  Diagnosis Date  . Anginal pain ([place])   . Arthritis    BACK & SHOULDERS  . CAD S/P percutaneous coronary angioplasty 2003   Dr Percival Spanish: 2003: a) s/p CABG (LIMA-LAD, SVG-Diag);b) 2008: prior PCI to native RCA; c) 12/12/2013: SVG-Diag occluded, patent LIMA, 95% post stent in RCA --> PCI  with 2.75 x 18 mm Xience Alpine DES, residual dRCA-PLA 80=-90%, EF 55-65%  . Diverticulosis   . GERD (gastroesophageal reflux disease)   . Gilbert's syndrome   . Heart murmur   . Hyperlipidemia   . Hypertension   . Osteopenia 2010    T score -1.9 @ hip (femoral neck)  . PUD (peptic ulcer disease)    PMH , ? 1995  . Short-term memory loss   . Shortness of breath     Past Surgical History:  Procedure Laterality Date  . CERVICAL FUSION  1997   Dr Joya Salm  . CHOLECYSTECTOMY  2004  . CORONARY ARTERY BYPASS GRAFT  2003   Last catheterization was in August 2008 demonstrated a LIMA to th LAD which was patent, there was an atretic saphenous vein graft to the diagonal, the LAD had a 90% stenosis in the large calcified segment, the diagonal has ostial 70% stenosis, the circumflex had  a ramus intermediate with ostial 25% stenosis, the right coronary artery was dominant.  There was an ulcerated 80%-90% stenosis.    . CORONARY STENT PLACEMENT  2008   drug-eluting stent to right coronary artery  . CORONARY STENT PLACEMENT Right 12/12/2013   RT CORONARY  DES       DR COOPER   . HEMORRHOID SURGERY    . LEFT HEART CATHETERIZATION WITH CORONARY/GRAFT ANGIOGRAM N/A 12/12/2013   Procedure: LEFT HEART CATHETERIZATION WITH Beatrix Fetters;  Surgeon: Blane Ohara, MD;  Location: [facility] CATH LAB;  Service: Cardiovascular;  Laterality: N/A;  . TONSILLECTOMY AND ADENOIDECTOMY    . UPPER GASTROINTESTINAL ENDOSCOPY      Dr Lyla Son   Family History  Problem Relation Age of Onset  . Cancer Father        ? primary  . Uterine cancer Daughter   . Hypertension Maternal Grandmother   . Peripheral vascular disease Maternal Grandmother   . Osteoporosis Sister   . Diabetes Sister   . Stroke Neg Hx   . Heart attack Neg Hx   . Colon cancer Neg Hx   . Prostate cancer Neg Hx      Allergies as of 09/02/2019      Reactions   Codeine Nausea  Only      Medication List       Accurate as of September 02, 2019 11:59 PM. If you have any questions, ask your nurse or doctor.        STOP taking these medications   fluticasone 27.5 MCG/SPRAY nasal spray Commonly known as: VERAMYST Stopped by: [name], MD     TAKE these medications   aspirin EC 81 MG tablet Take 81 mg by mouth daily.   azelastine 0.1 % nasal spray Commonly known as: ASTELIN Place 2 sprays into both nostrils 2 (two) times daily.   clonazePAM 0.5 MG tablet Commonly known as: KLONOPIN TAKE 1 TABLET BY MOUTH AT BEDTIME FOR REM BD AND CAN BE USED PRN FOR VERTIGO   Colon Herbal Cleanser Caps Take 1 capsule by mouth daily.   montelukast 10 MG tablet Commonly known as: SINGULAIR Take 1 tablet (10 mg total) by mouth at bedtime.   nitroGLYCERIN 0.4 MG SL tablet Commonly known as: NITROSTAT Place 1 tablet (0.4  mg total) under the tongue every 5 (five) minutes as needed for chest pain.   PROBIOTIC DAILY PO Take 37 mg by mouth daily.   rosuvastatin 20 MG tablet Commonly known as: CRESTOR Take 1 tablet (20 mg total) by mouth daily.   ZyrTEC Allergy 10 MG tablet Generic drug: cetirizine Take 10 mg by mouth daily.          Objective:   Physical Exam BP (!) 172/81 (BP Location: Left Arm, Patient Position: Sitting, Cuff Size: Small)   Pulse 76   Temp (!) 96.8 F (36 C) (Temporal)   Resp 18   Ht 5\' 2"  (1.575 m)   Wt 154 lb 4 oz (70 kg)   SpO2 98%   BMI 28.21 kg/m  General: Well developed, NAD, BMI noted Neck: No  thyromegaly.  No JVD at 45 degrees HEENT:  Normocephalic . Face symmetric, atraumatic Lungs:  Slightly decreased breath sounds at bases?  Otherwise clear. Normal respiratory effort, no intercostal retractions, no accessory muscle use. Heart: + Systolic murmur  .  Abdomen:  Not distended, soft, non-tender. No rebound or rigidity.   Lower extremities: no pretibial edema bilaterally  Skin: Exposed areas without rash. Not pale. Not jaundice Neurologic:  alert, cooperative.  No formal mental exam done today otherwise.  Speech normal, gait limited by general debility and kyphosis. Strength symmetric and appropriate for age.  Psych: Behavior appropriate. No anxious or depressed appearing.     Assessment     Assessment HTN Hyperlipidemia MCI: Saw neurology 07-2018, MMSE 25/30. CV: dr 12-24-1979 --CAD, CABG 2003, stent 2008, cath 2015 [facility]), dc plavix 02-2015 --Aortic regurgitation, mild mod OSA  --- REM sleep d/o ---> CPAP. Clonazepam qhs prn Vertigo CT head 2012 and 2015 nonacute. No help with antivert (03-2017) MSK --DJD --Severe kyphosis ---NCS 4-2017chronic radiucolopathy left C 6-7 and T1 and at L 4-5 and S1, saw neuro, rx conservative treatment  ---Osteopenia, nl vit D 2013, T score -2.0 (04-2016)  GERD, h/o PUD    PLAN Here for CPX HTN?  On no  medications, BP today is elevated, at home is consistently 100/50, 120/60 per patient's daughter. Hyperlipidemia: On Crestor, checking labs CAD, aortic regurgitation: Denies chest pain but reports DOE, slightly worse in the next few months?.  EKG today with no acute changes, NSR.  No evidence of CHF.  He has a  murmur consistent with aortic disease. Plan: Chest x-ray, refer to cardiology. Osteopenia: recheck dexa? Will think about ,  rec Vit D, avoid CA+ d/t constipation RTC 3 months  Today in addition to the physical exam I evaluate him for chronic medical issues including high cholesterol, CAD, aortic disease and osteopenia.  Additional 20 minutes were spent.  This visit occurred during the SARS-CoV-2 public health emergency.  Safety protocols were in place, including screening questions prior to the visit, additional usage of staff PPE, and extensive cleaning of exam room while observing appropriate contact time as indicated for disinfecting solutions.

## 2019-09-03 LAB — CBC WITH DIFFERENTIAL/PLATELET
Absolute Monocytes: 598 cells/uL (ref 200–950)
Basophils Absolute: 39 cells/uL (ref 0–200)
Basophils Relative: 0.6 %
Eosinophils Absolute: 286 cells/uL (ref 15–500)
Eosinophils Relative: 4.4 %
HCT: 44.7 % (ref 38.5–50.0)
Hemoglobin: 15.4 g/dL (ref 13.2–17.1)
Lymphs Abs: 1092 cells/uL (ref 850–3900)
MCH: 30.9 pg (ref 27.0–33.0)
MCHC: 34.5 g/dL (ref 32.0–36.0)
MCV: 89.8 fL (ref 80.0–100.0)
MPV: 11.7 fL (ref 7.5–12.5)
Monocytes Relative: 9.2 %
Neutro Abs: 4485 cells/uL (ref 1500–7800)
Neutrophils Relative %: 69 %
Platelets: 224 10*3/uL (ref 140–400)
RBC: 4.98 10*6/uL (ref 4.20–5.80)
RDW: 12.1 % (ref 11.0–15.0)
Total Lymphocyte: 16.8 %
WBC: 6.5 10*3/uL (ref 3.8–10.8)

## 2019-09-03 LAB — LIPID PANEL
Cholesterol: 144 mg/dL (ref <200)
HDL: 56 mg/dL (ref >=40)
LDL Cholesterol (Calc): 72 mg/dL (calc)
Non-HDL Cholesterol (Calc): 88 mg/dL (calc) (ref <130)
Total CHOL/HDL Ratio: 2.6 (calc) (ref <5.0)
Triglycerides: 81 mg/dL (ref <150)

## 2019-09-03 LAB — COMPREHENSIVE METABOLIC PANEL
AG Ratio: 1.7 (calc) (ref 1.0–2.5)
ALT: 17 U/L (ref 9–46)
AST: 17 U/L (ref 10–35)
Albumin: 4.8 g/dL (ref 3.6–5.1)
Alkaline phosphatase (APISO): 63 U/L (ref 35–144)
BUN: 17 mg/dL (ref 7–25)
CO2: 25 mmol/L (ref 20–32)
Calcium: 9.9 mg/dL (ref 8.6–10.3)
Chloride: 104 mmol/L (ref 98–110)
Creat: 0.83 mg/dL (ref 0.70–1.11)
Globulin: 2.8 g/dL (calc) (ref 1.9–3.7)
Glucose, Bld: 98 mg/dL (ref 65–99)
Potassium: 4.5 mmol/L (ref 3.5–5.3)
Sodium: 140 mmol/L (ref 135–146)
Total Bilirubin: 1.1 mg/dL (ref 0.2–1.2)
Total Protein: 7.6 g/dL (ref 6.1–8.1)

## 2019-09-04 NOTE — Assessment & Plan Note (Signed)
Td 2017 Prevnar 2016 PNM 23 2017 Shingrex: discuss at the next opportunity  S/p covid vaccine x 2 (last injection 3 days ago) CCS: Normal colonoscopy 12-2005.  Recommend no further screenings Prostate cancer screening: No further screening, see previous entries Labs: CMP, FLP, CBC

## 2019-09-04 NOTE — Assessment & Plan Note (Signed)
Here for CPX HTN?  On no medications, BP today is elevated, at home is consistently 100/50, 120/60 per patient's daughter. Hyperlipidemia: On Crestor, checking labs CAD, aortic regurgitation: Denies chest pain but reports DOE, slightly worse in the next few months?.  EKG today with no acute changes, NSR.  No evidence of CHF.  He has a  murmur consistent with aortic disease. Plan: Chest x-ray, refer to cardiology. Osteopenia: recheck dexa? Will think about , rec Vit D, avoid CA+ d/t constipation RTC 3 months

## 2019-09-05 ENCOUNTER — Telehealth: Payer: Self-pay

## 2019-09-05 ENCOUNTER — Telehealth: Payer: Self-pay | Admitting: Cardiology

## 2019-09-05 NOTE — Telephone Encounter (Signed)
Patient daughter [name] called in to- report that Dr. Drue Novel nurse Dorathy Daft was very rude and disrespectful towards her and per the daughter Dorathy Daft deleted some things off the patients My Chart. Please follow up with [place] at [phone] as soon as possible thanks,

## 2019-09-05 NOTE — Telephone Encounter (Signed)
*  STAT* If patient is at the pharmacy, call can be transferred to refill team.   1. Which medications need to be refilled? (please list name of each medication and dose if known) rosuvastatin (CRESTOR) 20 MG tablet  2. Which pharmacy/location (including street and city if local pharmacy) is medication to be sent to? [id number] - [place], [place] - [address]  3. Do they need a 30 day or 90 day supply? 90 day supply  Patient has an appointment scheduled for 10/12/19 at 3:20 PM with Dr. Swazil[place].

## 2019-09-06 ENCOUNTER — Other Ambulatory Visit: Payer: Self-pay

## 2019-09-06 MED ORDER — ROSUVASTATIN CALCIUM 20 MG PO TABS: 20.0000 mg | ORAL_TABLET | Freq: Every day | ORAL | 1 refills | Status: DC

## 2019-09-06 NOTE — Telephone Encounter (Signed)
Patient's daughter, [name] states the medication was sent to the incorrect pharmacy. She states the medication needs to be sent to  [id number] - [place], [place] - [address]     . Please assist.

## 2019-09-08 ENCOUNTER — Other Ambulatory Visit: Payer: Self-pay | Admitting: Neurology

## 2019-09-08 ENCOUNTER — Telehealth: Payer: Self-pay | Admitting: Internal Medicine

## 2019-09-08 DIAGNOSIS — G459 Transient cerebral ischemic attack, unspecified: Secondary | ICD-10-CM

## 2019-09-08 MED ORDER — CLONAZEPAM 0.5 MG PO TABS: ORAL_TABLET | ORAL | 0 refills | Status: DC

## 2019-09-08 NOTE — Telephone Encounter (Signed)
CallerGunnar Fusi  Call Back # [phone]  Per patient daughter patient's father was seen by EMS last night due to medication, they believe he has a TIA/Seizure. Pt did not go to ED. Patient thinking that he is having a side affect from SINGULAIR.

## 2019-09-08 NOTE — Telephone Encounter (Signed)
Spoke with [name] and the patient: Yesterday, at 9:15 PM he developed expressive aphasia. Symptoms started to improve by 9:30 PM at the time EMS arrived. He was back to normal at 11 PM.  BP by EMS was 182/91, reportedly motor exam was symmetric. They recommended ER, they declined it. No diplopia, headache, bladder or bowel incontinence. He did have some vertigo earlier yesterday but has not uncommon.  Today symptoms have not returned blood pressure is 133/67.  Suspect TIA. EMS if symptoms recur. Continue aspirin 81 mg for now Has an appointment to see neurology 09/21/2019, recommend to keep the appointment  Please arrange for a brain MRI and a carotid ultrasound, DX TIA.

## 2019-09-08 NOTE — Telephone Encounter (Signed)
Orders placed.

## 2019-09-09 NOTE — Telephone Encounter (Signed)
Spoke at length with patient's daughter. She expressed frustration around pharmacies being deleted off of her fathers profile. Due to his insurance she hast to use more than one local pharmacy to get the best price for him. When she asked for a refill from Dr. Elvis Coil office they sent it to [facility]'s [facility]. I told her I appreciated her feedback. I informed her that it used to be current practice to only allow one local and one mail in pharmacy to be in the patient's chart. However now that we are in a time of virtual care it's not as easy to print prescriptions to give to patients so we can allow for multiple local pharmacies.

## 2019-09-13 ENCOUNTER — Ambulatory Visit
Admission: RE | Admit: 2019-09-13 | Discharge: 2019-09-13 | Disposition: A | Discharge status: Home / Self Care | Payer: Medicare Other | Source: Ambulatory Visit | Attending: Internal Medicine | Admitting: Internal Medicine

## 2019-09-13 ENCOUNTER — Other Ambulatory Visit: Payer: Self-pay

## 2019-09-13 ENCOUNTER — Ambulatory Visit (HOSPITAL_COMMUNITY)
Admission: RE | Admit: 2019-09-13 | Discharge: 2019-09-13 | Disposition: A | Discharge status: Home / Self Care | Payer: Medicare Other | Source: Ambulatory Visit | Attending: Cardiovascular Disease | Admitting: Cardiovascular Disease

## 2019-09-13 DIAGNOSIS — G459 Transient cerebral ischemic attack, unspecified: Secondary | ICD-10-CM | POA: Diagnosis present

## 2019-09-14 ENCOUNTER — Telehealth: Payer: Self-pay | Admitting: Neurology

## 2019-09-14 ENCOUNTER — Encounter: Payer: Self-pay | Admitting: Internal Medicine

## 2019-09-14 ENCOUNTER — Telehealth: Payer: Self-pay | Admitting: Internal Medicine

## 2019-09-14 MED ORDER — MONTELUKAST SODIUM 10 MG PO TABS: 10.0000 mg | ORAL_TABLET | Freq: Every day | ORAL | 3 refills | Status: DC

## 2019-09-14 NOTE — Telephone Encounter (Signed)
Rx resent.

## 2019-09-14 NOTE — Telephone Encounter (Signed)
Erie Noe @[facility]  has called for a refill on pt's clonazePAM (KLONOPIN) 0.5 MG tablet

## 2019-09-14 NOTE — Telephone Encounter (Signed)
[name] Reinaldo Raddle [phone] Order #  [id number]  Patient medication sent to [facility], Per [facility] patient would like his medication sent to them.  Please resend this medication below to [facility]    montelukast (SINGULAIR) 10 MG tablet [[id number]]

## 2019-09-14 NOTE — Telephone Encounter (Signed)
Appears Dr Dohmeier sent a 1 mth supply to [facility] for the patient. This is to get him by until he has his yearly apt that is scheduled for April 7th. Once we see the patient we can then refill and send a 3 mth supply to [facility] at that time.

## 2019-09-19 ENCOUNTER — Telehealth: Payer: Medicare Other | Admitting: Cardiology

## 2019-09-19 MED ORDER — AMLODIPINE BESYLATE 2.5 MG PO TABS: 2.5000 mg | ORAL_TABLET | Freq: Every day | ORAL | 3 refills | Status: DC

## 2019-09-21 ENCOUNTER — Encounter: Payer: Self-pay | Admitting: Neurology

## 2019-09-21 ENCOUNTER — Ambulatory Visit (INDEPENDENT_AMBULATORY_CARE_PROVIDER_SITE_OTHER): Payer: Medicare Other | Admitting: Neurology

## 2019-09-21 ENCOUNTER — Other Ambulatory Visit: Payer: Self-pay

## 2019-09-21 VITALS — BP 147/78 | HR 73 | Temp 97.9°F | Ht 64.0 in | Wt 153.0 lb

## 2019-09-21 DIAGNOSIS — G4752 REM sleep behavior disorder: Secondary | ICD-10-CM | POA: Diagnosis not present

## 2019-09-21 DIAGNOSIS — Z9989 Dependence on other enabling machines and devices: Secondary | ICD-10-CM

## 2019-09-21 DIAGNOSIS — I10 Essential (primary) hypertension: Secondary | ICD-10-CM

## 2019-09-21 DIAGNOSIS — F0390 Unspecified dementia without behavioral disturbance: Secondary | ICD-10-CM

## 2019-09-21 DIAGNOSIS — G4733 Obstructive sleep apnea (adult) (pediatric): Secondary | ICD-10-CM | POA: Diagnosis not present

## 2019-09-21 DIAGNOSIS — R419 Unspecified symptoms and signs involving cognitive functions and awareness: Secondary | ICD-10-CM

## 2019-09-21 MED ORDER — CLONAZEPAM 0.5 MG PO TABS: ORAL_TABLET | ORAL | 5 refills | Status: DC
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
## 2019-09-21 NOTE — Patient Instructions (Signed)

## 2019-09-22 ENCOUNTER — Encounter: Payer: Self-pay | Admitting: Neurology

## 2019-09-22 ENCOUNTER — Telehealth: Payer: Self-pay | Admitting: Neurology

## 2019-09-22 NOTE — Telephone Encounter (Signed)
[email or web address]  [id number]  Is asking for a call from RN to clarify the instructions for pt's clonazePAM (KLONOPIN) 0.5 MG tablet please call

## 2019-09-22 NOTE — Telephone Encounter (Signed)
Called and clarified the script for the patient at the [facility] pharmacy.

## 2019-09-23 MED ORDER — CLONAZEPAM 0.5 MG PO TABS: ORAL_TABLET | ORAL | 1 refills | Status: DC

## 2019-09-23 NOTE — Telephone Encounter (Signed)
Dear [name], I can certainly change that to 90 days. Be well and stay well, Sharlie Shreffler D.

## 2019-10-04 ENCOUNTER — Encounter: Payer: Self-pay | Admitting: Internal Medicine

## 2019-10-04 NOTE — Progress Notes (Signed)
[name] Date of Birth: [birth year] Medical Record #[id number]  History of Present Illness: [name] is seen for follow up CAD. He is s/p CABG in 2003 by Dr. Cornelius Moras with LIMA to the LAD and SVG to the Diagonal. By cath in 2008 he had a high grade proximal RCA stenosis and had stenting with a 2.75 mm Promus stent. He was noted to have an atretic SVG to the diagonal but it was felt that antegrade flow into the diagonal was OK. His last stress Myoview in November 2012 showed a fixed apical defect without ischemia and EF 62%.  In May 2015 he presented with unstable angina. Cardiac cath showed patent LIMA to the LAD and occlusion of SVG to the diagonal. There was a severe stenosis in the RCA which was stented with a DES. He also had an Echo in 9/15 for a murmur and has AV sclerosis with mild to moderate AI.  He was seen in the ED in [place] in September 2018 for evaluation of dyspnea, nausea, hearburn,  and vertigo. Evaluation with blood work and Ecg unremarkable. CT chest negative for PE or other acute findings. He was treated for vertigo with resolution.   Carotid dopplers in March 2021 showed no obstructive disease. He was seen by Dr Vickey Huger with concern of possible TIA. He had garbled speech and confusion. BP was elevated to 200/100. Started on low dose amlodipine. Ecg, CXR and labs were unremarkable. MRI showed no acute change. Planning to do an EEG.    On follow up today he is doing fairly well.  He is seen with his daughter. Once in the past year he had a pinching pain in his right chest resolved with massage. No dyspnea. No other chest pain. He has chronic back and leg pain. BP running from 104-155 systolic.   Allergies as of 10/12/2019      Reactions   Codeine Nausea Only      Medication List       Accurate as of October 12, 2019  4:01 PM. If you have any questions, ask your nurse or doctor.        STOP taking these medications   Colon Herbal Cleanser Caps Stopped by:  Melady Chow [place], MD   montelukast 10 MG tablet Commonly known as: SINGULAIR Stopped by: Bellina Tokarczyk [place], MD     TAKE these medications   amLODipine 2.5 MG tablet Commonly known as: NORVASC Take 1 tablet (2.5 mg total) by mouth daily.   aspirin EC 81 MG tablet Take 81 mg by mouth daily.   azelastine 0.1 % nasal spray Commonly known as: ASTELIN Place 2 sprays into both nostrils 2 (two) times daily.   clonazePAM 0.5 MG tablet Commonly known as: KLONOPIN TAKE 1 TABLET BY MOUTH AT BEDTIME FOR REM BD AND CAN BE USED PRN FOR VERTIGO   nitroGLYCERIN 0.4 MG SL tablet Commonly known as: NITROSTAT Place 1 tablet (0.4 mg total) under the tongue every 5 (five) minutes as needed for chest pain.   PROBIOTIC DAILY PO Take 37 mg by mouth daily.   rosuvastatin 20 MG tablet Commonly known as: CRESTOR Take 1 tablet (20 mg total) by mouth daily. What changed: additional instructions Changed by: Neoma Laming, LPN   Vitamin D 50 MCG (2000 UT) Caps Take by mouth daily.   ZyrTEC Allergy 10 MG tablet Generic drug: cetirizine Take 10 mg by mouth daily.        Allergies  Allergen Reactions  . Codeine  Nausea Only    Past Medical History:  Diagnosis Date  . Anginal pain (HCC)   . Arthritis    BACK & SHOULDERS  . CAD S/P percutaneous coronary angioplasty 2003   Dr Antoine Poche: 2003: a) s/p CABG (LIMA-LAD, SVG-Diag);b) 2008: prior PCI to native RCA; c) 12/12/2013: SVG-Diag occluded, patent LIMA, 95% post stent in RCA --> PCI  with 2.75 x 18 mm Xience Alpine DES, residual dRCA-PLA 80=-90%, EF 55-65%  . Diverticulosis   . GERD (gastroesophageal reflux disease)   . Gilbert's syndrome   . Heart murmur   . Hyperlipidemia   . Hypertension   . Osteopenia 2010    T score -1.9 @ hip (femoral neck)  . PUD (peptic ulcer disease)    PMH , ? 1995  . Short-term memory loss   . Shortness of breath     Past Surgical History:  Procedure Laterality Date  . CERVICAL FUSION  1997   Dr Jeral Fruit  .  CHOLECYSTECTOMY  2004  . CORONARY ARTERY BYPASS GRAFT  2003   Last catheterization was in August 2008 demonstrated a LIMA to th LAD which was patent, there was an atretic saphenous vein graft to the diagonal, the LAD had a 90% stenosis in the large calcified segment, the diagonal has ostial 70% stenosis, the circumflex had a ramus intermediate with ostial 25% stenosis, the right coronary artery was dominant.  There was an ulcerated 80%-90% stenosis.    . CORONARY STENT PLACEMENT  2008   drug-eluting stent to right coronary artery  . CORONARY STENT PLACEMENT Right 12/12/2013   RT CORONARY  DES       DR COOPER   . HEMORRHOID SURGERY    . LEFT HEART CATHETERIZATION WITH CORONARY/GRAFT ANGIOGRAM N/A 12/12/2013   Procedure: LEFT HEART CATHETERIZATION WITH Isabel Caprice;  Surgeon: Micheline Chapman, MD;  Location: [facility] CATH LAB;  Service: Cardiovascular;  Laterality: N/A;  . TONSILLECTOMY AND ADENOIDECTOMY    . UPPER GASTROINTESTINAL ENDOSCOPY      Dr Victorino Dike    Social History   Socioeconomic History  . Marital status: Married    Spouse name: [name]  . Number of children: 2  . Years of education: Bachelors  . Highest education level: Not on file  Occupational History  . Occupation: retired     Associate Professor: RETIRED  Tobacco Use  . Smoking status: Former Smoker    Quit date: 02/25/1979    Years since quitting: 40.6  . Smokeless tobacco: Never Used  . Tobacco comment: smoked 1955-1980, up to 1 ppd  Substance and Sexual Activity  . Alcohol use: Yes    Comment: 1/2 cup / day of wine or 1 can beer/day  . Drug use: No  . Sexual activity: Never  Other Topics Concern  . Not on file  Social History Narrative   Patient is married ([place]) and lives at home with his wife and daughter [name] .   Patient has two adult children- daughters x 2  ([name], [place])   Patient is retired.   Patient has a Bachelor's degree.   Patient is right-handed.   Patient drinks 1/2 liter of green tea  daily.   Walks once week for exercise   Born in [place]    Social Determinants of Health   Financial Resource Strain:   . Difficulty of Paying Living Expenses:   Food Insecurity:   . Worried About Programme researcher, broadcasting/film/video in the Last Year:   . [employer]  in the Last Year:   Transportation Needs:   . Film/video editor (Medical):   [name] Lack of Transportation (Non-Medical):   Physical Activity:   . Days of Exercise per Week:   . Minutes of Exercise per Session:   Stress:   . Feeling of Stress :   Social Connections:   . Frequency of Communication with Friends and Family:   . Frequency of Social Gatherings with Friends and Family:   . Attends Religious Services:   . Active Member of Clubs or Organizations:   . Attends Archivist Meetings:   [name] Marital Status:     Family History  Problem Relation Age of Onset  . Cancer Father        ? primary  . Uterine cancer Daughter   . Hypertension Maternal Grandmother   . Peripheral vascular disease Maternal Grandmother   . Osteoporosis Sister   . Diabetes Sister   . Stroke Neg Hx   . Heart attack Neg Hx   . Colon cancer Neg Hx   . Prostate cancer Neg Hx     Review of Systems: As noted in HPI.  All other systems were reviewed and are negative.  Physical Exam: BP 122/68   Pulse 80   Ht 5\' 4"  (1.626 m)   Wt 154 lb 12.8 oz (70.2 kg)   SpO2 96%   BMI 26.57 kg/m  Filed Weights   10/12/19 1532  Weight: 154 lb 12.8 oz (70.2 kg)  GENERAL:  Well appearing elderly WM in NAD HEENT:  PERRL, EOMI, sclera are clear. Oropharynx is clear. NECK:  No jugular venous distention, carotid upstroke brisk and symmetric, no bruits, no thyromegaly or adenopathy LUNGS:  Clear to auscultation bilaterally CHEST:  Unremarkable, kyphotic HEART:  RRR,  PMI not displaced or sustained,S1 and S2 within normal limits, no S3, no S4: no clicks, no rubs, gr 2/6 systolic murmur at the apex. ABD:  Soft, nontender. BS +, no masses or bruits. No  hepatomegaly, no splenomegaly EXT:  2 + pulses throughout, no edema, no cyanosis no clubbing SKIN:  Warm and dry.  No rashes NEURO:  Alert and oriented x 3. Cranial nerves II through XII intact. PSYCH:  Cognitively intact   LABORATORY DATA:  Lab Results  Component Value Date   WBC 6.5 09/02/2019   HGB 15.4 09/02/2019   HCT 44.7 09/02/2019   PLT 224 09/02/2019   GLUCOSE 98 09/02/2019   CHOL 144 09/02/2019   TRIG 81 09/02/2019   HDL 56 09/02/2019   LDLCALC 72 09/02/2019   ALT 17 09/02/2019   AST 17 09/02/2019   NA 140 09/02/2019   K 4.5 09/02/2019   CL 104 09/02/2019   CREATININE 0.83 09/02/2019   BUN 17 09/02/2019   CO2 25 09/02/2019   TSH 1.39 08/27/2017   PSA 0.98 06/04/2006   INR 0.98 01/15/2014   HGBA1C 5.6 10/26/2014    Assessment / Plan: 1. CAD. Known CAD s/p CABG in 2003 and subsequent stenting of native RCA 2008. DES of the proximal RCA in June 2015.  He has no significant angina.  Continue ASA.  2. Mild to moderate AI.  Normal LV size and function. Asymptomatic.   3. Hyperlipidemia- on statin.Excellent control.   4. Back/hip pain.secondary to kyphosis.  5. Possible TIA. So far evaluation OK. Awaiting EEG results.   6. Labile HTN. Improved on low dose amlodipine. Recommend if BP > 170 can take an extra 2.5 mg. Otherwise continue current therapy. Would  like to avoid hypotension.    Follow up in 6 months.

## 2019-10-12 ENCOUNTER — Encounter: Payer: Self-pay | Admitting: Cardiology

## 2019-10-12 ENCOUNTER — Ambulatory Visit (INDEPENDENT_AMBULATORY_CARE_PROVIDER_SITE_OTHER): Payer: Medicare Other | Admitting: Cardiology

## 2019-10-12 ENCOUNTER — Other Ambulatory Visit: Payer: Self-pay

## 2019-10-12 VITALS — BP 122/68 | HR 80 | Ht 64.0 in | Wt 154.8 lb

## 2019-10-12 DIAGNOSIS — E785 Hyperlipidemia, unspecified: Secondary | ICD-10-CM

## 2019-10-12 DIAGNOSIS — I2510 Atherosclerotic heart disease of native coronary artery without angina pectoris: Secondary | ICD-10-CM | POA: Diagnosis not present

## 2019-10-12 DIAGNOSIS — R0989 Other specified symptoms and signs involving the circulatory and respiratory systems: Secondary | ICD-10-CM | POA: Diagnosis not present

## 2019-10-12 MED ORDER — NITROGLYCERIN 0.4 MG SL SUBL: 0.4000 mg | SUBLINGUAL_TABLET | SUBLINGUAL | 11 refills | Status: DC | PRN

## 2019-10-12 MED ORDER — ROSUVASTATIN CALCIUM 20 MG PO TABS: 20.0000 mg | ORAL_TABLET | Freq: Every day | ORAL | 3 refills | Status: DC

## 2019-10-24 ENCOUNTER — Other Ambulatory Visit: Payer: Self-pay

## 2019-10-24 ENCOUNTER — Ambulatory Visit (INDEPENDENT_AMBULATORY_CARE_PROVIDER_SITE_OTHER): Payer: Medicare Other | Admitting: Neurology

## 2019-10-24 DIAGNOSIS — G4733 Obstructive sleep apnea (adult) (pediatric): Secondary | ICD-10-CM

## 2019-10-24 DIAGNOSIS — R4701 Aphasia: Secondary | ICD-10-CM

## 2019-10-24 DIAGNOSIS — R419 Unspecified symptoms and signs involving cognitive functions and awareness: Secondary | ICD-10-CM

## 2019-10-24 DIAGNOSIS — Z9989 Dependence on other enabling machines and devices: Secondary | ICD-10-CM

## 2019-10-24 DIAGNOSIS — F0390 Unspecified dementia without behavioral disturbance: Secondary | ICD-10-CM

## 2019-10-24 DIAGNOSIS — I10 Essential (primary) hypertension: Secondary | ICD-10-CM

## 2019-10-24 DIAGNOSIS — G4752 REM sleep behavior disorder: Secondary | ICD-10-CM

## 2019-11-04 NOTE — Procedures (Signed)
  This patient is a 85 year old gentleman with a history of parasomnia-REM sleep behavior disorder, sleep apnea, and recently suffering from transient aphasic spells.  The patient was evaluated by EEG on 24 Oct 2019 with a prolonged recording of 68 minutes.  Technical aspects this EEG study was done with scalp electrodes positioned according to the International 10-20 placement system, a sampling rate of 500 Hz and a high filter frequency of 70 Hz and a low frequency filter of 2 Hz was applied.  The EEG recording was also followed by a video on a separate screen.  At the very beginning we noticed frequent eye blinking, and a posterior dominant rhythm of only 7 Hz indicating slowing.  Hyperventilation was performed leading to a physiologically expected amplitude buildup which remains symmetric, without epileptiform activity and at a 7 Hz rhythm.  All this was accompanied by a regular EKG single electrodes rhythm strip.  At 46 minutes into this recording the patient became increasingly drowsy and finally sleep was reached-indicated by vertex sharp waves, K complexes  indicating sleep stages N1 and non-REM to.  Within another 7 minutes or so the patient entered slow-wave sleep non-REM sleep stage III.  Photic stimulation left to minimal driving response, frequent eye blinking returned.  But no epileptiform activity was noted.  Impression this EEG indicates generalized slowing which can be related to a static encephalopathy or to the presence of sedating substances or medications. No epileptiform activity was noted.  Melvyn Novas, MD

## 2019-11-04 NOTE — Progress Notes (Signed)
Please contact Mr. Diltz daughter with the results. Abnormal EEG due to slowing, which is common finding in dementia patients. I did not see epileptiform activity

## 2019-11-07 ENCOUNTER — Telehealth: Payer: Self-pay | Admitting: Neurology

## 2019-11-07 NOTE — Telephone Encounter (Signed)
-----   Message from Melvyn Novas, MD sent at 11/04/2019  2:50 PM EDT ----- Please contact Mr. [place] daughter with the results. Abnormal EEG due to slowing, which is common finding in dementia patients. I did not see epileptiform activity

## 2019-11-07 NOTE — Telephone Encounter (Signed)
Called the patient's daughter, there was no answer. LVM asking her to call back.   When daughter calls back. Please advise that the EEG did not indicate seizure like activity. It did show slowing on the brain which is something typically seen in patient with dementia.

## 2019-11-08 ENCOUNTER — Encounter: Payer: Self-pay | Admitting: Neurology

## 2019-12-06 ENCOUNTER — Ambulatory Visit (INDEPENDENT_AMBULATORY_CARE_PROVIDER_SITE_OTHER): Payer: Medicare Other | Admitting: Internal Medicine

## 2019-12-06 ENCOUNTER — Other Ambulatory Visit: Payer: Self-pay

## 2019-12-06 VITALS — BP 120/66 | HR 74 | Temp 97.9°F | Resp 12 | Ht 64.0 in | Wt 154.0 lb

## 2019-12-06 DIAGNOSIS — F0390 Unspecified dementia without behavioral disturbance: Secondary | ICD-10-CM

## 2019-12-06 DIAGNOSIS — R21 Rash and other nonspecific skin eruption: Secondary | ICD-10-CM

## 2019-12-06 DIAGNOSIS — I10 Essential (primary) hypertension: Secondary | ICD-10-CM | POA: Diagnosis not present

## 2019-12-06 MED ORDER — BETAMETHASONE DIPROPIONATE AUG 0.05 % EX CREA: TOPICAL_CREAM | Freq: Two times a day (BID) | CUTANEOUS | 0 refills | Status: DC

## 2019-12-06 MED ORDER — DONEPEZIL HCL 5 MG PO TABS: 5.0000 mg | ORAL_TABLET | Freq: Every day | ORAL | 3 refills | Status: DC

## 2019-12-06 NOTE — Patient Instructions (Signed)
Start taking Aricept 5 mg 1 tablet daily  Call me in 4 weeks, let me know how that is working  Continue checking your blood pressure BP GOAL is between 110/65 and  135/85. If it is consistently higher or lower, let me know     GO TO THE FRONT DESK, PLEASE SCHEDULE YOUR APPOINTMENTS Come back for  a checkup in 3 months

## 2019-12-06 NOTE — Progress Notes (Signed)
Subjective:    Patient ID: [name], male    DOB: January 29, 1935, 85 y.o.   MRN: [id number]  DOS:  12/06/2019 Type of visit - description: Follow-up, here with his daughter Imagene Gurney  Since the last office visit, he saw cardiology, neurology, had several tests.  Chart is reviewed & summarized the assessment and plan.  Ambulatory BPs are great  The daughter reports memory is rapidly declining. No behavioral issues although the patient reports that he gets very frustrated because he realizes his memory is decreasing.  No recent falls No headache, diplopia, slurred speech or motor deficits.  Also has developed itchy rash on the lower extremities on and off    Review of Systems See above   Past Medical History:  Diagnosis Date  . Anginal pain ([place])   . Arthritis    BACK & SHOULDERS  . CAD S/P percutaneous coronary angioplasty 2003   Dr Percival Spanish: 2003: a) s/p CABG (LIMA-LAD, SVG-Diag);b) 2008: prior PCI to native RCA; c) 12/12/2013: SVG-Diag occluded, patent LIMA, 95% post stent in RCA --> PCI  with 2.75 x 18 mm Xience Alpine DES, residual dRCA-PLA 80=-90%, EF 55-65%  . Diverticulosis   . GERD (gastroesophageal reflux disease)   . Gilbert's syndrome   . Heart murmur   . Hyperlipidemia   . Hypertension   . Osteopenia 2010    T score -1.9 @ hip (femoral neck)  . PUD (peptic ulcer disease)    PMH , ? 1995  . Short-term memory loss   . Shortness of breath     Past Surgical History:  Procedure Laterality Date  . CERVICAL FUSION  1997   Dr Joya Salm  . CHOLECYSTECTOMY  2004  . CORONARY ARTERY BYPASS GRAFT  2003   Last catheterization was in August 2008 demonstrated a LIMA to th LAD which was patent, there was an atretic saphenous vein graft to the diagonal, the LAD had a 90% stenosis in the large calcified segment, the diagonal has ostial 70% stenosis, the circumflex had a ramus intermediate with ostial 25% stenosis, the right coronary artery was dominant.  There was an ulcerated  80%-90% stenosis.    . CORONARY STENT PLACEMENT  2008   drug-eluting stent to right coronary artery  . CORONARY STENT PLACEMENT Right 12/12/2013   RT CORONARY  DES       DR COOPER   . HEMORRHOID SURGERY    . LEFT HEART CATHETERIZATION WITH CORONARY/GRAFT ANGIOGRAM N/A 12/12/2013   Procedure: LEFT HEART CATHETERIZATION WITH Beatrix Fetters;  Surgeon: Blane Ohara, MD;  Location[facility] C[facility] LAB;  Service: Cardiovascular;  Laterality: N/A;  . TONSILLECTOMY AND ADENOIDECTOMY    . UPPER GASTROINTESTINAL ENDOSCOPY      Dr Lyla Son    Allergies as of 12/06/2019      Reactions   Codeine Nausea Only      Medication List       Accurate as of December 06, 2019  4:01 PM. If you have any questions, ask your nurse or doctor.        amLODipine 2.5 MG tablet Commonly known as: NORVASC Take 1 tablet (2.5 mg total) by mouth daily.   aspirin EC 81 MG tablet Take 81 mg by mouth daily.   azelastine 0.1 % nasal spray Commonly known as: ASTELIN Place 2 sprays into both nostrils 2 (two) times daily.   clonazePAM 0.5 MG tablet Commonly known as: KLONOPIN TAKE 1 TABLET BY MOUTH AT BEDTIME FOR REM BD AND CAN BE  USED PRN FOR VERTIGO   Flonase 50 MCG/ACT nasal spray Generic drug: fluticasone Place into both nostrils daily.   nitroGLYCERIN 0.4 MG SL tablet Commonly known as: NITROSTAT Place 1 tablet (0.4 mg total) under the tongue every 5 (five) minutes as needed for chest pain.   PROBIOTIC DAILY PO Take 37 mg by mouth daily.   rosuvastatin 20 MG tablet Commonly known as: CRESTOR Take 1 tablet (20 mg total) by mouth daily.   Vitamin D 50 MCG (2000 UT) Caps Take by mouth daily.   ZyrTEC Allergy 10 MG tablet Generic drug: cetirizine Take 10 mg by mouth daily.          Objective:   Physical Exam BP 120/66 (BP Location: Left Arm, Cuff Size: Normal)   Pulse 74   Temp 97.9 F (36.6 C) (Temporal)   Resp 12   Ht 5\' 4"  (1.626 m)   Wt 154 lb (69.9 kg)   SpO2 98%   BMI 26.43  kg/m  General:   Well developed, elderly, frail gentleman in no distress. HEENT:  Normocephalic . Face symmetric, atraumatic Lungs:  CTA B Normal respiratory effort, no intercostal retractions, no accessory muscle use. Heart: RRR, + systolic murmur.  Lower extremities: no pretibial edema bilaterally  Skin:  Multiple, round, scaly, red, lesions scattered at the legs.  No blisters, less than 2 cm in diameter Neurologic:  alert & please send, no formal mental status performed however recent MMSE @ neuro was 21.    Speech normal, gait appropriate for age and unassisted Psych--  Cognition and judgment appear intact.  Cooperative with normal attention span and concentration.  Behavior appropriate. No anxious or depressed appearing.      Assessment      Assessment HTN Hyperlipidemia MCI: Saw neurology 07-2018, MMSE 25/30. CV: dr 12-24-1979 --CAD, CABG 2003, stent 2008, cath 2015 [facility]), dc plavix 02-2015 --Aortic regurgitation, mild mod OSA  --- REM sleep d/o ---> CPAP. Clonazepam qhs prn Vertigo CT head 2012 and 2015 nonacute. No help with antivert (03-2017) MSK --DJD --Severe kyphosis ---NCS 4-2017chronic radiucolopathy left C 6-7 and T1 and at L 4-5 and S1, saw neuro, rx conservative treatment  ---Osteopenia, nl vit D 2013, T score -2.0 (04-2016)  GERD, h/o PUD    PLAN TIA?  See phone note from 09/08/2019, possibly TIA W/u: carotid ultrasound 1 to 39% bilaterally, MRI brain no acute (08-2019) Saw neurology on 09/21/2019. Their impression: TIAs only one of the many potential etiologies, they did notice decreased memory, getting more frail and having some gait difficulties as well as more REM behavioral disorder. EEG was abnormal due to slowing, commonly seen in patients with dementia. Dementia: See above, a neurology MMSE was decreased to 21. Treatment discussed, we agreed on low-dose of Aricept daily, they will call in 6 weeks and let me know if that is working and there is  no side effects, we could increase the dose.  Expectations from Aricept discussed. HTN: Cardiology recommended to take extra 2.5 mg of amlodipine if BP is elevated.  Ambulatory BPs are great in the 130s/70s. CAD Saw cardiology 10/12/2019, felt to be stable. Rash: See physical exam, eczema?  Rx Diprolene.  Call if no better RTC 3 months   This visit occurred during the SARS-CoV-2 public health emergency.  Safety protocols were in place, including screening questions prior to the visit, additional usage of staff PPE, and extensive cleaning of exam room while observing appropriate contact time as indicated for disinfecting solutions.

## 2019-12-07 NOTE — Assessment & Plan Note (Signed)
TIA?  See phone note from 09/08/2019, possibly TIA W/u: carotid ultrasound 1 to 39% bilaterally, MRI brain no acute (08-2019) Saw neurology on 09/21/2019. Their impression: TIAs only one of the many potential etiologies, they did notice decreased memory, getting more frail and having some gait difficulties as well as more REM behavioral disorder. EEG was abnormal due to slowing, commonly seen in patients with dementia. Dementia: See above, a neurology MMSE was decreased to 21. Treatment discussed, we agreed on low-dose of Aricept daily, they will call in 6 weeks and let me know if that is working and there is no side effects, we could increase the dose.  Expectations from Aricept discussed. HTN: Cardiology recommended to take extra 2.5 mg of amlodipine if BP is elevated.  Ambulatory BPs are great in the 130s/70s. CAD Saw cardiology 10/12/2019, felt to be stable. Rash: See physical exam, eczema?  Rx Diprolene.  Call if no better RTC 3 months

## 2019-12-26 ENCOUNTER — Other Ambulatory Visit: Payer: Self-pay | Admitting: Internal Medicine

## 2020-01-09 ENCOUNTER — Ambulatory Visit: Payer: Medicare Other | Admitting: Adult Health

## 2020-01-09 ENCOUNTER — Encounter: Payer: Self-pay | Admitting: Internal Medicine

## 2020-01-10 MED ORDER — DONEPEZIL HCL 10 MG PO TABS
10.0000 mg | ORAL_TABLET | Freq: Every day | ORAL | 1 refills | Status: DC
Start: 2020-01-10 — End: 2020-06-11

## 2020-01-25 ENCOUNTER — Telehealth: Payer: Self-pay | Admitting: *Deleted

## 2020-01-25 NOTE — Telephone Encounter (Signed)
Received call from [facility] pharmacist, Jeanie Cooks asking for confirmation of Rx transferred to them from [facility] for clonazepam. I read clonazepam Rx Dr Dohmeier ordered 09/2019 with Rx details. Jeanie Cooks stated it matches transferred Rx they received. She verbalized understanding, appreciation.

## 2020-02-08 ENCOUNTER — Encounter: Payer: Self-pay | Admitting: Neurology

## 2020-02-13 ENCOUNTER — Ambulatory Visit (INDEPENDENT_AMBULATORY_CARE_PROVIDER_SITE_OTHER): Payer: Medicare Other | Admitting: Neurology

## 2020-02-13 ENCOUNTER — Encounter: Payer: Self-pay | Admitting: Neurology

## 2020-02-13 ENCOUNTER — Other Ambulatory Visit: Payer: Self-pay

## 2020-02-13 VITALS — BP 127/66 | HR 74 | Ht 64.0 in | Wt 148.0 lb

## 2020-02-13 DIAGNOSIS — G4752 REM sleep behavior disorder: Secondary | ICD-10-CM | POA: Diagnosis not present

## 2020-02-13 DIAGNOSIS — R259 Unspecified abnormal involuntary movements: Secondary | ICD-10-CM

## 2020-02-13 DIAGNOSIS — F0390 Unspecified dementia without behavioral disturbance: Secondary | ICD-10-CM

## 2020-02-13 DIAGNOSIS — G4731 Primary central sleep apnea: Secondary | ICD-10-CM | POA: Diagnosis not present

## 2020-02-13 DIAGNOSIS — R29818 Other symptoms and signs involving the nervous system: Secondary | ICD-10-CM | POA: Insufficient documentation

## 2020-02-13 MED ORDER — CARBIDOPA-LEVODOPA 25-100 MG PO TABS
1.0000 | ORAL_TABLET | Freq: Three times a day (TID) | ORAL | 2 refills | Status: DC
Start: 2020-02-13 — End: 2020-03-19
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
## 2020-02-13 NOTE — Patient Instructions (Signed)
Take your dopamin medication 40 minutes before a meal- with water only. The dopamine cannot be absorbed with any protein in your stomach.   Carbidopa; Levodopa tablets What is this medicine? CARBIDOPA;LEVODOPA (kar bi DOE pa; lee voe DOE pa) is used to treat the symptoms of Parkinson's disease. This medicine may be used for other purposes; ask your health care provider or pharmacist if you have questions. COMMON BRAND NAME(S): Atamet, SINEMET What should I tell my health care provider before I take this medicine? They need to know if you have any of these conditions:  depression or other mental illness  diabetes  glaucoma  heart disease, including history of a heart attack  history of irregular heartbeat  kidney disease  liver disease  lung or breathing disease, like asthma  narcolepsy  sleep apnea  stomach or intestine problems  an unusual or allergic reaction to levodopa, carbidopa, other medicines, foods, dyes, or preservatives  pregnant or trying to get pregnant  breast-feeding How should I use this medicine? Take this medicine by mouth with a glass of water. Follow the directions on the prescription label. Take your doses at regular intervals. Do not take your medicine more often than directed. Do not stop taking except on the advice of your doctor or health care professional. Talk to your pediatrician regarding the use of this medicine in children. Special care may be needed. Overdosage: If you think you have taken too much of this medicine contact a poison control center or emergency room at once. NOTE: This medicine is only for you. Do not share this medicine with others. What if I miss a dose? If you miss a dose, take it as soon as you can. If it is almost time for your next dose, take only that dose. Do not take double or extra doses. What may interact with this medicine? Do not take this medicine with any of the following medications:  MAOIs like Marplan,  Nardil, and Parnate  reserpine  tetrabenazine This medicine may also interact with the following medications:  alcohol  droperidol  entacapone  iron supplements or multivitamins with iron  isoniazid, INH  linezolid  medicines for depression, anxiety, or psychotic disturbances  medicines for high blood pressure  medicines for sleep  metoclopramide  papaverine  procarbazine  tedizolid  rasagiline  selegiline  tolcapone This list may not describe all possible interactions. Give your health care provider a list of all the medicines, herbs, non-prescription drugs, or dietary supplements you use. Also tell them if you smoke, drink alcohol, or use illegal drugs. Some items may interact with your medicine. What should I watch for while using this medicine? Visit your health care professional for regular checks on your progress. Tell your health care professional if your symptoms do not start to get better or if they get worse. Do not stop taking except on your health care professional's advice. You may develop a severe reaction. Your health care professional will tell you how much medicine to take. You may experience a wearing of effect prior to the time for your next dose of this medicine. You may also experience an on-off effect where the medicine apparently stops working for anything from a minute to several hours, then suddenly starts working again. Tell your doctor or health care professional if any of these symptoms happen to you. Your dose may need to be changed. A high protein diet can slow or prevent absorption of this medicine. Avoid high protein foods near the time  of taking this medicine to help to prevent these problems. Take this medicine at least 30 minutes before eating or one hour after meals. You may want to eat higher protein foods later in the day or in small amounts. Discuss your diet with your doctor or health care professional or nutritionist. You may get  drowsy or dizzy. Do not drive, use machinery, or do anything that needs mental alertness until you know how this drug affects you. Do not stand or sit up quickly, especially if you are an older patient. This reduces the risk of dizzy or fainting spells. Alcohol may interfere with the effect of this medicine. Avoid alcoholic drinks. When taking this medicine, you may fall asleep without notice. You may be doing activities like driving a car, talking, or eating. You may not feel drowsy before it happens. Contact your health care provider right away if this happens to you. There have been reports of increased sexual urges or other strong urges such as gambling while taking this medicine. If you experience any of these while taking this medicine, you should report this to your health care provider as soon as possible. If you are diabetic, this medicine may interfere with the accuracy of some tests for sugar or ketones in the urine (does not interfere with blood tests). Check with your doctor or health care professional before changing the dose of your diabetic medicine. This medicine may discolor the urine or sweat, making it look darker or red in color. This is of no cause for concern. However, this may stain clothing or fabrics. This medicine may cause a decrease in vitamin B6. You should make sure that you get enough vitamin B6 while you are taking this medicine. Discuss the foods you eat and the vitamins you take with your health care professional. What side effects may I notice from receiving this medicine? Side effects that you should report to your doctor or health care professional as soon as possible:  allergic reactions like skin rash, itching or hives, swelling of the face, lips, or tongue  changes in emotions or moods  falling asleep during normal activities like driving  fast, irregular heartbeat  feeling faint or lightheaded, falls  fever  hallucinations  new or increased gambling  urges, sexual urges, uncontrolled spending, binge or compulsive eating, or other urges  stomach pain  trouble passing urine or change in the amount of urine  uncontrollable movements of the arms, face, head, mouth, neck, or upper body Side effects that usually do not require medical attention (report to your doctor or health care professional if they continue or are bothersome):  dizziness  headache  loss of appetite  nausea  trouble sleeping This list may not describe all possible side effects. Call your doctor for medical advice about side effects. You may report side effects to FDA at 1-800-FDA-1088. Where should I keep my medicine? Keep out of the reach of children. Store at room temperature between 15 and 30 degrees C (59 and 86 degrees F). Protect from light. Throw away any unused medicine after the expiration date. NOTE: This sheet is a summary. It may not cover all possible information. If you have questions about this medicine, talk to your doctor, pharmacist, or health care provider.  2020 Elsevier/Gold Standard (2019-02-07 19:49:59)

## 2020-03-07 ENCOUNTER — Other Ambulatory Visit: Payer: Self-pay

## 2020-03-07 ENCOUNTER — Encounter: Payer: Self-pay | Admitting: Internal Medicine

## 2020-03-07 ENCOUNTER — Ambulatory Visit: Payer: Medicare Other | Admitting: Internal Medicine

## 2020-03-07 VITALS — BP 138/90 | HR 63 | Temp 98.1°F | Resp 18 | Ht 64.0 in | Wt 152.5 lb

## 2020-03-07 DIAGNOSIS — I10 Essential (primary) hypertension: Secondary | ICD-10-CM

## 2020-03-07 DIAGNOSIS — M159 Polyosteoarthritis, unspecified: Secondary | ICD-10-CM

## 2020-03-07 DIAGNOSIS — F0390 Unspecified dementia without behavioral disturbance: Secondary | ICD-10-CM

## 2020-03-07 DIAGNOSIS — M150 Primary generalized (osteo)arthritis: Secondary | ICD-10-CM

## 2020-03-07 DIAGNOSIS — R5383 Other fatigue: Secondary | ICD-10-CM | POA: Diagnosis not present

## 2020-03-07 DIAGNOSIS — M8949 Other hypertrophic osteoarthropathy, multiple sites: Secondary | ICD-10-CM

## 2020-03-07 DIAGNOSIS — Z23 Encounter for immunization: Secondary | ICD-10-CM | POA: Diagnosis not present

## 2020-03-07 DIAGNOSIS — I351 Nonrheumatic aortic (valve) insufficiency: Secondary | ICD-10-CM

## 2020-03-07 NOTE — Progress Notes (Signed)
Subjective:    Patient ID: [name], male    DOB: 08/23/1934, 85 y.o.   MRN: [id number]  DOS:  03/07/2020 Type of visit - description: Follow-up Here with his daughter [name]  Multiple concerns:  Fatigue: It is described as a mix of lack of energy, DOE and aches when he moves.  Recently was seen by neurology, note reviewed, started Sinemet.  Also reports numbness at the left arm, left leg and feet. Cramps at the left foot mostly at night. Patient wonders if symptoms started shortly after Sinemet initiation.  MCI/dementia: The patient's daughter reports good days and bad days, behavior is okay most of the time, sometimes he is " stubborn"  Wt Readings from Last 3 Encounters:  03/07/20 152 lb 8 oz (69.2 kg)  02/13/20 148 lb (67.1 kg)  12/06/19 154 lb (69.9 kg)    Review of Systems Denies chest pain, ask about neck pain and he admits to it for a while, nothing new. No headache No weight loss noted No cough No fever   Past Medical History:  Diagnosis Date  . Anginal pain (HCC)   . Arthritis    BACK & SHOULDERS  . CAD S/P percutaneous coronary angioplasty 2003   Dr Antoine Poche: 2003: a) s/p CABG (LIMA-LAD, SVG-Diag);b) 2008: prior PCI to native RCA; c) 12/12/2013: SVG-Diag occluded, patent LIMA, 95% post stent in RCA --> PCI  with 2.75 x 18 mm Xience Alpine DES, residual dRCA-PLA 80=-90%, EF 55-65%  . Diverticulosis   . GERD (gastroesophageal reflux disease)   . Gilbert's syndrome   . Heart murmur   . Hyperlipidemia   . Hypertension   . Osteopenia 2010    T score -1.9 @ hip (femoral neck)  . PUD (peptic ulcer disease)    PMH , ? 1995  . Short-term memory loss   . Shortness of breath     Past Surgical History:  Procedure Laterality Date  . CERVICAL FUSION  1997   Dr Jeral Fruit  . CHOLECYSTECTOMY  2004  . CORONARY ARTERY BYPASS GRAFT  2003   Last catheterization was in August 2008 demonstrated a LIMA to th LAD which was patent, there was an atretic saphenous vein  graft to the diagonal, the LAD had a 90% stenosis in the large calcified segment, the diagonal has ostial 70% stenosis, the circumflex had a ramus intermediate with ostial 25% stenosis, the right coronary artery was dominant.  There was an ulcerated 80%-90% stenosis.    . CORONARY STENT PLACEMENT  2008   drug-eluting stent to right coronary artery  . CORONARY STENT PLACEMENT Right 12/12/2013   RT CORONARY  DES       DR COOPER   . HEMORRHOID SURGERY    . LEFT HEART CATHETERIZATION WITH CORONARY/GRAFT ANGIOGRAM N/A 12/12/2013   Procedure: LEFT HEART CATHETERIZATION WITH Isabel Caprice;  Surgeon: Micheline Chapman, MD;  Location: [facility] CATH LAB;  Service: Cardiovascular;  Laterality: N/A;  . TONSILLECTOMY AND ADENOIDECTOMY    . UPPER GASTROINTESTINAL ENDOSCOPY      Dr Victorino Dike    Allergies as of 03/07/2020      Reactions   Codeine Nausea Only      Medication List       Accurate as of March 07, 2020 11:59 PM. If you have any questions, ask your nurse or doctor.        amLODipine 2.5 MG tablet Commonly known as: NORVASC Take 1 tablet (2.5 mg total) by mouth daily.   aspirin  EC 81 MG tablet Take 81 mg by mouth daily.   augmented betamethasone dipropionate 0.05 % cream Commonly known as: DIPROLENE-AF Apply topically 2 (two) times daily.   azelastine 0.1 % nasal spray Commonly known as: ASTELIN Place 2 sprays into both nostrils 2 (two) times daily.   carbidopa-levodopa 25-100 MG tablet Commonly known as: SINEMET IR Take 1 tablet by mouth 3 (three) times daily.   clonazePAM 0.5 MG tablet Commonly known as: KLONOPIN TAKE 1 TABLET BY MOUTH AT BEDTIME FOR REM BD AND CAN BE USED PRN FOR VERTIGO   donepezil 10 MG tablet Commonly known as: Aricept Take 1 tablet (10 mg total) by mouth at bedtime.   Flonase 50 MCG/ACT nasal spray Generic drug: fluticasone Place into both nostrils daily.   nitroGLYCERIN 0.4 MG SL tablet Commonly known as: NITROSTAT Place 1 tablet  (0.4 mg total) under the tongue every 5 (five) minutes as needed for chest pain.   PROBIOTIC DAILY PO Take 37 mg by mouth daily.   rosuvastatin 20 MG tablet Commonly known as: CRESTOR Take 1 tablet (20 mg total) by mouth daily.   Vitamin D 50 MCG (2000 UT) Caps Take by mouth daily.   ZyrTEC Allergy 10 MG tablet Generic drug: cetirizine Take 10 mg by mouth daily.          Objective:   Physical Exam BP 138/90 (BP Location: Left Arm)   Pulse 63   Temp 98.1 F (36.7 C) (Oral)   Resp 18   Ht 5\' 4"  (1.626 m)   Wt 152 lb 8 oz (69.2 kg)   SpO2 91%   BMI 26.18 kg/m  General:   Well developed, NAD, BMI noted. HEENT:  Normocephalic . Face symmetric, atraumatic Lungs:  CTA B Normal respiratory effort, no intercostal retractions, no accessory muscle use. Heart: RRR, + systolic murmur.  Lower extremities: no pretibial edema bilaterally  Skin: Not pale. Not jaundice Neurologic:  alert & oriented X3.  We talk about soccer and he has a good distal memory. Speech normal, gait: He seems unsteady, not using a cane, required assistance getting onto the table. Motor: Symmetric.  DTRs: Normal upper extremities, slightly decreased at the lower extremities in a symmetric fashion. Psych--   Behavior appropriate. No anxious or depressed appearing.      Assessment      Assessment HTN Hyperlipidemia MCI: Saw neurology 07-2018, MMSE 25/30. CV: dr 12-24-1979 --CAD, CABG 2003, stent 2008, cath 2015 [facility]), dc plavix 02-2015 --Aortic regurgitation, mild mod OSA  --- REM sleep d/o ---> CPAP. Clonazepam qhs prn Vertigo CT head 2012 and 2015 nonacute. No help with antivert (03-2017) MSK --DJD --Severe kyphosis ---NCS 4-2017chronic radiucolopathy left C 6-7 and T1 and at L 4-5 and S1, saw neuro, rx conservative treatment  ---Osteopenia, nl vit D 2013, T score -2.0 (04-2016)  GERD, h/o PUD    PLAN HTN:BP slightly elevated upon arrival, rechecked and improved.  At home is consistently  normal.  Continue amlodipine, checking BMP. CAD: Denies chest pain, reports fatigue and DOE. Fatigue, DOE: Likely multifactorial including aging, DJD, recent diagnosis of Parkinson, dementia, etc. He has aortic disease, no recent echo.  Last CBC normal.    Plan: Check echo, TSH. Aches and pains: Reports aches and pains with movement, he has DJD, severe kyphosis.  No headache or weight loss.  Suspect this is DJD related. Dementia, Parkinson: at the last visit, I was concerned about TIA, then saw neurology, they did not feel that was an issue. He  was noted to have a decline in short-term memory but also increased motor tone, neurology is becoming suspicious of Parkinson disease.  Was Rx Sinemet Daughter reports he has good and bad days regarding his memory Today he also complains of ill-defined paresthesias and shakiness of the left foot.  Neurological exam is at baseline. Recommend to follow-up with neurology as planned. RTC for labs (BMP, TSH) Might need a Covid vaccine booster very soon. RTC 4 months   This visit occurred during the SARS-CoV-2 public health emergency.  Safety protocols were in place, including screening questions prior to the visit, additional usage of staff PPE, and extensive cleaning of exam room while observing appropriate contact time as indicated for disinfecting solutions.

## 2020-03-07 NOTE — Progress Notes (Signed)
Pre visit review using our clinic review tool, if applicable. No additional management support is needed unless otherwise documented below in the visit note. 

## 2020-03-07 NOTE — Patient Instructions (Signed)
Check the  blood pressure regularly BP GOAL is between 110/65 and  135/85. If it is consistently higher or lower, let me know    GO TO THE FRONT DESK, PLEASE SCHEDULE YOUR APPOINTMENTS Come back for blood work at your earliest convenience  Come back for a checkup in 4 months

## 2020-03-09 NOTE — Assessment & Plan Note (Signed)
HTN:BP slightly elevated upon arrival, rechecked and improved.  At home is consistently normal.  Continue amlodipine, checking BMP. CAD: Denies chest pain, reports fatigue and DOE. Fatigue, DOE: Likely multifactorial including aging, DJD, recent diagnosis of Parkinson, dementia, etc. He has aortic disease, no recent echo.  Last CBC normal.    Plan: Check echo, TSH. Aches and pains: Reports aches and pains with movement, he has DJD, severe kyphosis.  No headache or weight loss.  Suspect this is DJD related. Dementia, Parkinson: at the last visit, I was concerned about TIA, then saw neurology, they did not feel that was an issue. He was noted to have a decline in short-term memory but also increased motor tone, neurology is becoming suspicious of Parkinson disease.  Was Rx Sinemet Daughter reports he has good and bad days regarding his memory Today he also complains of ill-defined paresthesias and shakiness of the left foot.  Neurological exam is at baseline. Recommend to follow-up with neurology as planned. RTC for labs (BMP, TSH) Might need a Covid vaccine booster very soon. RTC 4 months

## 2020-03-13 ENCOUNTER — Other Ambulatory Visit: Payer: Medicare Other

## 2020-03-13 ENCOUNTER — Other Ambulatory Visit: Payer: Self-pay

## 2020-03-13 DIAGNOSIS — R5383 Other fatigue: Secondary | ICD-10-CM

## 2020-03-13 DIAGNOSIS — I10 Essential (primary) hypertension: Secondary | ICD-10-CM

## 2020-03-14 LAB — BASIC METABOLIC PANEL
BUN: 14 mg/dL (ref 7–25)
CO2: 27 mmol/L (ref 20–32)
Calcium: 9.9 mg/dL (ref 8.6–10.3)
Chloride: 101 mmol/L (ref 98–110)
Creat: 0.87 mg/dL (ref 0.70–1.11)
Glucose, Bld: 94 mg/dL (ref 65–99)
Potassium: 4.2 mmol/L (ref 3.5–5.3)
Sodium: 138 mmol/L (ref 135–146)

## 2020-03-14 LAB — TSH: TSH: 0.71 mIU/L (ref 0.40–4.50)

## 2020-03-18 ENCOUNTER — Encounter: Payer: Self-pay | Admitting: Internal Medicine

## 2020-03-18 ENCOUNTER — Encounter: Payer: Self-pay | Admitting: Neurology

## 2020-03-19 ENCOUNTER — Other Ambulatory Visit: Payer: Self-pay | Admitting: Internal Medicine

## 2020-03-19 ENCOUNTER — Other Ambulatory Visit: Payer: Self-pay | Admitting: Neurology

## 2020-03-19 MED ORDER — CYCLOBENZAPRINE HCL 10 MG PO TABS: 10.0000 mg | ORAL_TABLET | Freq: Every evening | ORAL | 0 refills | Status: DC | PRN

## 2020-03-19 MED ORDER — CARBIDOPA-LEVODOPA 25-100 MG PO TABS: 1.0000 | ORAL_TABLET | Freq: Three times a day (TID) | ORAL | 2 refills | Status: DC

## 2020-04-04 ENCOUNTER — Other Ambulatory Visit: Payer: Self-pay

## 2020-04-04 ENCOUNTER — Ambulatory Visit (HOSPITAL_COMMUNITY): Payer: Medicare Other | Attending: Cardiology

## 2020-04-04 DIAGNOSIS — I351 Nonrheumatic aortic (valve) insufficiency: Secondary | ICD-10-CM | POA: Insufficient documentation

## 2020-04-04 LAB — ECHOCARDIOGRAM COMPLETE
AR max vel: 2.31 cm2
AV Area VTI: 2.21 cm2
AV Area mean vel: 2.09 cm2
AV Mean grad: 7.5 mmHg
AV Peak grad: 14.7 mmHg
Ao pk vel: 1.92 m/s
Area-P 1/2: 3.99 cm2
S' Lateral: 2.3 cm

## 2020-04-12 ENCOUNTER — Encounter: Payer: Self-pay | Admitting: Internal Medicine

## 2020-04-16 ENCOUNTER — Other Ambulatory Visit: Payer: Self-pay

## 2020-04-16 ENCOUNTER — Ambulatory Visit (INDEPENDENT_AMBULATORY_CARE_PROVIDER_SITE_OTHER): Payer: Medicare Other | Admitting: Neurology

## 2020-04-16 ENCOUNTER — Telehealth: Payer: Self-pay | Admitting: Neurology

## 2020-04-16 ENCOUNTER — Encounter: Payer: Self-pay | Admitting: Neurology

## 2020-04-16 VITALS — BP 132/74 | HR 62 | Ht 63.5 in | Wt 152.0 lb

## 2020-04-16 DIAGNOSIS — R252 Cramp and spasm: Secondary | ICD-10-CM

## 2020-04-16 DIAGNOSIS — R259 Unspecified abnormal involuntary movements: Secondary | ICD-10-CM | POA: Diagnosis not present

## 2020-04-16 DIAGNOSIS — G4752 REM sleep behavior disorder: Secondary | ICD-10-CM | POA: Diagnosis not present

## 2020-04-16 DIAGNOSIS — G4731 Primary central sleep apnea: Secondary | ICD-10-CM

## 2020-04-16 DIAGNOSIS — G4733 Obstructive sleep apnea (adult) (pediatric): Secondary | ICD-10-CM

## 2020-04-16 DIAGNOSIS — Z9989 Dependence on other enabling machines and devices: Secondary | ICD-10-CM

## 2020-04-16 DIAGNOSIS — R29818 Other symptoms and signs involving the nervous system: Secondary | ICD-10-CM

## 2020-04-16 DIAGNOSIS — F0390 Unspecified dementia without behavioral disturbance: Secondary | ICD-10-CM

## 2020-04-16 MED ORDER — CARBIDOPA-LEVODOPA 25-100 MG PO TABS
1.0000 | ORAL_TABLET | Freq: Three times a day (TID) | ORAL | 3 refills | Status: DC
Start: 2020-04-16 — End: 2020-11-21

## 2020-04-16 NOTE — Patient Instructions (Signed)
Parkinson's Disease Parkinson's disease causes problems with movements. It is a long-term condition. It gets worse over time (is progressive). It affects each person in different ways. It makes it harder for you to:  Control how your body moves.  Move your body normally. The condition can range from mild to very bad (advanced). What are the causes? This condition results from a loss of brain cells called neurons. These brain cells make a chemical called dopamine, which is needed to control body movement. As the condition gets worse, the brain cells make less dopamine. This makes it hard to move or control your movements. The exact cause of this condition is not known. What increases the risk?  Being male.  Being age 60 or older.  Having family members who had Parkinson's disease.  Having had an injury to the brain.  Being very sad (depressed).  Being around things that are harmful or poisonous. What are the signs or symptoms? Symptoms of this condition can vary. The main symptoms have to do with movement. These include:  A tremor or shaking while you are resting that you cannot control.  Stiffness in your neck, arms, and legs.  Slowing of movement. This may include: ? Losing expressions of the face. ? Having trouble making small movements that are needed to button your clothing or brush your teeth.  Walking in a way that is not normal. You may walk with short, shuffling steps.  Loss of balance when standing. You may sway, fall backward, or have trouble making turns. Other symptoms include:  Being very sad, worried, or confused.  Seeing or hearing things that are not real.  Losing thinking abilities (dementia).  Trouble speaking or swallowing.  Having a hard time pooping (constipation).  Needing to pee right away, peeing often, or not being able to control when you pee or poop.  Sleep problems. How is this treated? There is no cure. The goal of treatment is to  manage your symptoms. Treatment may include:  Medicines.  Therapy to help with talking or movement.  Surgery to reduce shaking and other movements that you cannot control. Follow these instructions at home: Medicines  Take over-the-counter and prescription medicines only as told by your doctor.  Avoid taking pain or sleeping medicines. Eating and drinking  Follow instructions from your doctor about what you cannot eat or drink.  Do not drink alcohol. Activity  Talk with your doctor about if it is safe for you to drive.  Do exercises as told by your doctor. Lifestyle      Put in grab bars and railings in your home. These help to prevent falls.  Do not use any products that contain nicotine or tobacco, such as cigarettes, e-cigarettes, and chewing tobacco. If you need help quitting, ask your doctor.  Join a support group. General instructions  Talk with your doctor about what you need help with and what your safety needs are.  Keep all follow-up visits as told by your doctor, including any therapy visits to help with talking or moving. This is important. Contact a doctor if:  Medicines do not help your symptoms.  You feel off-balance.  You fall at home.  You need more help at home.  You have trouble swallowing.  You have a very hard time pooping.  You have a lot of side effects from your medicines.  You feel very sad, worried, or confused. Get help right away if:  You were hurt in a fall.  You see   or hear things that are not real.  You cannot swallow without choking.  You have chest pain or trouble breathing.  You do not feel safe at home.  You have thoughts about hurting yourself or others. If you ever feel like you may hurt yourself or others, or have thoughts about taking your own life, get help right away. You can go to your nearest emergency department or call:  Your local emergency services (911 in the [place]  A suicide crisis helpline, such  as the National Suicide Prevention Lifeline at 1-800-273-8255. This is open 24 hours a day. Summary  This condition causes problems with movements.  It is a long-term condition. It gets worse over time.  There is no cure. Treatment focuses on managing your symptoms.  Talk with your doctor about what you need help with and what your safety needs are.  Keep all follow-up visits as told by your doctor. This is important. This information is not intended to replace advice given to you by your health care provider. Make sure you discuss any questions you have with your health care provider. Document Revised: 08/19/2018 Document Reviewed: 08/19/2018 Elsevier Patient Education  2020 [employer]  

## 2020-04-16 NOTE — Telephone Encounter (Signed)
Continue aricept and carbidopa - levodopa.
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
## 2020-04-19 NOTE — Progress Notes (Signed)
Cardiology Clinic Note   Patient Name: [name] Date of Encounter: 04/20/2020  Primary Care Provider:  [name], MD Primary Cardiologist:  Matthew Swaziland, MD  Patient Profile    [name] 85 year old male presents the clinic today for follow-up of his coronary artery disease status post PCA/PCI 12/12/2013 of the RCA.  Past Medical History    Past Medical History:  Diagnosis Date  . Anginal pain (HCC)   . Arthritis    BACK & SHOULDERS  . CAD S/P percutaneous coronary angioplasty 2003   Matthew [name]: 2003: a) s/p CABG (LIMA-LAD, SVG-Diag);b) 2008: prior PCI to native RCA; c) 12/12/2013: SVG-Diag occluded, patent LIMA, 95% post stent in RCA --> PCI  with 2.75 x 18 mm Xience Alpine DES, residual dRCA-PLA 80=-90%, EF 55-65%  . Diverticulosis   . GERD (gastroesophageal reflux disease)   . Gilbert's syndrome   . Heart murmur   . Hyperlipidemia   . Hypertension   . Osteopenia 2010    T score -1.9 @ hip (femoral neck)  . PUD (peptic ulcer disease)    PMH , ? 1995  . Short-term memory loss   . Shortness of breath    Past Surgical History:  Procedure Laterality Date  . CERVICAL FUSION  1997   Matthew [name]  . CHOLECYSTECTOMY  2004  . CORONARY ARTERY BYPASS GRAFT  2003   Last catheterization was in August 2008 demonstrated a LIMA to th LAD which was patent, there was an atretic saphenous vein graft to the diagonal, the LAD had a 90% stenosis in the large calcified segment, the diagonal has ostial 70% stenosis, the circumflex had a ramus intermediate with ostial 25% stenosis, the right coronary artery was dominant.  There was an ulcerated 80%-90% stenosis.    . CORONARY STENT PLACEMENT  2008   drug-eluting stent to right coronary artery  . CORONARY STENT PLACEMENT Right 12/12/2013   RT CORONARY  DES       Matthew COOPER   . HEMORRHOID SURGERY    . LEFT HEART CATHETERIZATION WITH CORONARY/GRAFT ANGIOGRAM N/A 12/12/2013   Procedure: LEFT HEART CATHETERIZATION WITH [name];  Surgeon: Matthew Chapman, MD;  Location: [name];  Service: Cardiovascular;  Laterality: N/A;  . TONSILLECTOMY AND ADENOIDECTOMY    . UPPER GASTROINTESTINAL ENDOSCOPY      [name]    Allergies  Allergies  Allergen Reactions  . Codeine Nausea Only    History of Present Illness    Mr.[name] has a PMH of essential hypertension, aortic regurgitation, OSA on CPAP GERD, HLD, and CAD status post PCI of RCA 12/12/2013.  He underwent CABG in 2013 by Matthew. Cornelius [name] (LIMA-LAD SVG-diagonal.  He had repeat cardiac catheterization in 2008 which showed high-grade proximal RCA stenosis and he received DES x1.  He was noted to have atretic SVG to diagonal however, it was felt that anterograde flow into the diagonal was okay.  Nuclear stress test 11/12 showed a fixed apical defect without ischemia and EF 63%.  5/15 he presented with unstable angina.  Cardiac catheterization showed patent LIMA-LAD and occlusion of SVG-diagonal.  Severe stenosis in the RCA was noted and he received DES x1.  Echocardiogram 9/15 for murmur showed his aortic valve sclerosis with mild-moderate AI.  He was seen in [place] in the emergency department 9/18 for evaluation of dyspnea, nausea, heartburn and vertigo.  His work-up was unremarkable.  CT chest was negative for PE and other acute findings.  He was treated for his vertigo which resolved.  Carotid Dopplers 3/21 showed nonocclusive disease.  He was seen by Matthew. Vickey [name] with concerns for possible TIA.  He was noted to have garbled speech and confusion.  Her blood pressure was elevated to the 200/100 range.  He was started on low-dose amlodipine.  His EKG, CXR and labs were unremarkable.  MRI showed no acute changes.  He was last seen by [name] on 10/12/2019.  During that time he presented with his daughter.  He indicated that he had 1 episode of pinching pain in his right chest that resolved with massage.  He denied dyspnea and other chest pain.  Of note he  has chronic back and leg pain.  His blood pressure at the time was ranging from 10 4-55 systolic.  He presents to the clinic today for follow-up evaluation and states he has been diagnosed with Lewy body dementia versus Parkinson's.  These are being managed by Matthew. Drue [name].  He is having increasing mobility difficulties.  He is trying to remain physically active doing upper and lower body exercises daily.  He denies episodes of chest discomfort or chest pain.  He does have some bruised spots on his hands.  I explained that these are probably related to his aspirin.  He presents with his daughter who is been staying with him since May last year.  She has been helping with doctors appointments and other health related challenges that him and his wife are facing.  We reviewed his cholesterol today and the importance of high-fiber diet.  I will give him a salty 6 diet sheet, have him increase his physical activity as tolerated, and follow-up in 6 months.  Today he denies chest pain, shortness of breath, lower extremity edema, fatigue, palpitations, melena, hematuria, hemoptysis, diaphoresis, weakness, presyncope, syncope, orthopnea, and PND.   Home Medications    Prior to Admission medications   Medication Sig Start Date End Date Taking? Authorizing Provider  amLODipine (NORVASC) 2.5 MG tablet Take 1 tablet (2.5 mg total) by mouth daily. 12/27/19   Matthew Plump, MD  aspirin EC 81 MG tablet Take 81 mg by mouth daily.    [provider]  augmented betamethasone dipropionate (DIPROLENE-AF) 0.05 % cream Apply topically 2 (two) times daily. 12/27/19   Matthew Plump, MD  azelastine (ASTELIN) 0.1 % nasal spray Place 2 sprays into both nostrils 2 (two) times daily. 09/02/19   Matthew Plump, MD  carbidopa-levodopa (SINEMET IR) 25-100 MG tablet Take 1 tablet by mouth 3 (three) times daily. 04/16/20   Dohmeier, Matthew Mylar, MD  cetirizine (ZYRTEC ALLERGY) 10 MG tablet Take 10 mg by mouth daily.    [provider]   Cholecalciferol (VITAMIN D) 50 MCG (2000 UT) CAPS Take by mouth daily.    [provider]  clonazePAM (KLONOPIN) 0.5 MG tablet TAKE 1 TABLET BY MOUTH AT BEDTIME FOR REM BD AND CAN BE USED PRN FOR VERTIGO 09/23/19   Dohmeier, Matthew Mylar, MD  cyclobenzaprine (FLEXERIL) 10 MG tablet Take 1 tablet (10 mg total) by mouth at bedtime as needed for muscle spasms. 03/19/20   Matthew Plump, MD  donepezil (ARICEPT) 10 MG tablet Take 1 tablet (10 mg total) by mouth at bedtime. 01/10/20   Matthew Plump, MD  fluticasone [facility]) 50 MCG/ACT nasal spray Place into both nostrils daily.    [provider]  nitroGLYCERIN (NITROSTAT) 0.4 MG SL tablet Place 1 tablet (0.4 mg total) under the tongue every 5 (  five) minutes as needed for chest pain. 10/12/19   [name], Matthew M, MD  Probiotic Product (PROBIOTIC DAILY PO) Take 37 mg by mouth daily.    [provider]  rosuvastatin (CRESTOR) 20 MG tablet Take 1 tablet (20 mg total) by mouth daily. 10/12/19   [name], Matthew M, MD    Family History    Family History  Problem Relation Age of Onset  . Cancer Father        ? primary  . Uterine cancer Daughter   . Hypertension Maternal Grandmother   . Peripheral vascular disease Maternal Grandmother   . Osteoporosis Sister   . Diabetes Sister   . Stroke Neg Hx   . Heart attack Neg Hx   . Colon cancer Neg Hx   . Prostate cancer Neg Hx    He indicated that his mother is deceased. He indicated that his father is deceased. He indicated that the status of his sister is unknown. He indicated that the status of his maternal grandmother is unknown. He indicated that the status of his daughter is unknown. He indicated that the status of his neg hx is unknown.  Social History    Social History   Socioeconomic History  . Marital status: Married    Spouse name: [name]  . Number of children: 2  . Years of education: Bachelors  . Highest education level: Not on file  Occupational History  . Occupation: retired      Associate Professor: RETIRED  Tobacco Use  . Smoking status: Former Smoker    Quit date: 02/25/1979    Years since quitting: 41.1  . Smokeless tobacco: Never Used  . Tobacco comment: smoked 1955-1980, up to 1 ppd  Substance and Sexual Activity  . Alcohol use: Yes    Comment: 1/2 cup / day of wine or 1 can beer/day  . Drug use: No  . Sexual activity: Never  Other Topics Concern  . Not on file  Social History Narrative   Patient is married ([place]) and lives at home with his wife and daughter [name] .   Patient has two adult children- daughters x 2  ([name], [place])   Patient is retired.   Patient has a Bachelor's degree.   Patient is right-handed.   Patient drinks 1/2 liter of green tea daily.   Walks once week for exercise   Born in [place]    Social Determinants of Health   Financial Resource Strain:   . Difficulty of Paying Living Expenses: Not on file  Food Insecurity:   . Worried About Programme researcher, broadcasting/film/video in the Last Year: Not on file  . Ran Out of Food in the Last Year: Not on file  Transportation Needs:   . Lack of Transportation (Medical): Not on file  . Lack of Transportation (Non-Medical): Not on file  Physical Activity:   . Days of Exercise per Week: Not on file  . Minutes of Exercise per Session: Not on file  Stress:   . Feeling of Stress : Not on file  Social Connections:   . Frequency of Communication with Friends and Family: Not on file  . Frequency of Social Gatherings with Friends and Family: Not on file  . Attends Religious Services: Not on file  . Active Member of Clubs or Organizations: Not on file  . Attends Banker Meetings: Not on file  . Marital Status: Not on file  Intimate Partner Violence:   . Fear of Current or Ex-Partner:  Not on file  . Emotionally Abused: Not on file  . Physically Abused: Not on file  . Sexually Abused: Not on file     Review of Systems    General:  No chills, fever, night sweats or weight changes.   Cardiovascular:  No chest pain, dyspnea on exertion, edema, orthopnea, palpitations, paroxysmal nocturnal dyspnea. Dermatological: No rash, lesions/masses Respiratory: No cough, dyspnea Urologic: No hematuria, dysuria Abdominal:   No nausea, vomiting, diarrhea, bright red blood per rectum, melena, or hematemesis Neurologic:  No visual changes, wkns, changes in mental status. All other systems reviewed and are otherwise negative except as noted above.  Physical Exam    VS:  BP 140/68   Pulse 81   Ht 5' 3.5" (1.613 m)   Wt 153 lb 9.6 oz (69.7 kg)   SpO2 96%   BMI 26.78 kg/m  , BMI Body mass index is 26.78 kg/m. GEN: Well nourished, well developed, in no acute distress. HEENT: normal. Neck: Supple, no JVD, carotid bruits, or masses. Cardiac: RRR, no murmurs, rubs, or gallops. No clubbing, cyanosis, edema.  Radials/DP/PT 2+ and equal bilaterally.  Respiratory:  Respirations regular and unlabored, clear to auscultation bilaterally. GI: Soft, nontender, nondistended, BS + x 4. MS: no deformity or atrophy. Skin: warm and dry, no rash. Neuro:  Strength and sensation are intact. Psych: Normal affect.  Accessory Clinical Findings    Recent Labs: 09/02/2019: ALT 17; Hemoglobin 15.4; Platelets 224 03/13/2020: BUN 14; Creat 0.87; Potassium 4.2; Sodium 138; TSH 0.71   Recent Lipid Panel    Component Value Date/Time   CHOL 144 09/02/2019 1510   CHOL 140 03/27/2017 1614   TRIG 81 09/02/2019 1510   HDL 56 09/02/2019 1510   HDL 59 03/27/2017 1614   CHOLHDL 2.6 09/02/2019 1510   VLDL 16.6 10/23/2015 1128   LDLCALC 72 09/02/2019 1510    ECG personally reviewed by me today-none today.- No acute changes  Echocardiogram 04/04/2020 IMPRESSIONS    1. Left ventricular ejection fraction, by estimation, is 60 to 65%. The  left ventricle has normal function. The left ventricle has no regional  wall motion abnormalities. Left ventricular diastolic parameters are  consistent with Grade I  diastolic  dysfunction (impaired relaxation).  2. Right ventricular systolic function is mildly reduced. The right  ventricular size is normal. There is normal pulmonary artery systolic  pressure. The estimated right ventricular systolic pressure is 23.1 mmHg.  3. The mitral valve is degenerative. No evidence of mitral valve  regurgitation. No evidence of mitral stenosis.  4. The aortic valve is tricuspid. There is moderate calcification of the  aortic valve. Aortic valve regurgitation is not visualized. Mild to  moderate aortic valve sclerosis/calcification is present, without any  evidence of aortic stenosis.  5. The inferior vena cava is normal in size with greater than 50%  respiratory variability, suggesting right atrial pressure of 3 mmHg.  Assessment & Plan   1.   Coronary artery disease-no chest pain today.  Underwent CABG 2003 and received RCA stenting 2008.  Received DES x1 to proximal RCA 6/15. Continue aspirin, amlodipine, rosuvastatin, nitroglycerin Heart healthy low-sodium diet-salty 6 given Increase physical activity as tolerated  Hyperlipidemia-09/02/2019: Cholesterol 144; HDL 56; LDL Cholesterol (Calc) 72; Triglycerides 81 Continue rosuvastatin Heart healthy low-sodium high-fiber diet Increase physical activity as tolerated  Labile hypertension-BP today 140/68.  Has had better control at home.  Previous recommendation (BP greater than 170 can take extra dose of 2.5 mg amlodipine) blood pressures at home  ranging from 118 systolic up to 150 with diastolics in the 60s and 70s. Continue amlodipine Heart healthy low-sodium diet-salty 6 given Increase physical activity as tolerated  Aortic insufficiency-no increased activity intolerance or DOE.  Echocardiogram 04/04/2020 showed moderate aortic calcification, no aortic valve regurgitation, mild-moderate aortic valve sclerosis/calcification, and no aortic stenosis. Continue to monitor.  Disposition: Follow-up with Matthew.  Swaziland or me in 6 months.  Thomasene Ripple. Demeka Sutter NP-C    04/20/2020, 3:26 PM [facility] [address] Office [phone] Fax [phone]  Notice: This dictation was prepared with Dragon dictation along with smaller phrase technology. Any transcriptional errors that result from this process are unintentional and may not be corrected upon review.

## 2020-04-20 ENCOUNTER — Encounter: Payer: Self-pay | Admitting: General Practice

## 2020-04-20 ENCOUNTER — Other Ambulatory Visit: Payer: Self-pay

## 2020-04-20 ENCOUNTER — Telehealth: Payer: Self-pay | Admitting: Cardiovascular Disease

## 2020-04-20 ENCOUNTER — Ambulatory Visit (INDEPENDENT_AMBULATORY_CARE_PROVIDER_SITE_OTHER): Payer: Medicare Other | Admitting: General Practice

## 2020-04-20 VITALS — BP 140/68 | HR 81 | Ht 63.5 in | Wt 153.6 lb

## 2020-04-20 DIAGNOSIS — I351 Nonrheumatic aortic (valve) insufficiency: Secondary | ICD-10-CM | POA: Diagnosis not present

## 2020-04-20 DIAGNOSIS — E782 Mixed hyperlipidemia: Secondary | ICD-10-CM | POA: Diagnosis not present

## 2020-04-20 DIAGNOSIS — I2510 Atherosclerotic heart disease of native coronary artery without angina pectoris: Secondary | ICD-10-CM | POA: Diagnosis not present

## 2020-04-20 DIAGNOSIS — R0989 Other specified symptoms and signs involving the circulatory and respiratory systems: Secondary | ICD-10-CM | POA: Diagnosis not present

## 2020-04-20 NOTE — Patient Instructions (Signed)
Medication Instructions:  The current medical regimen is effective;  continue present plan and medications as directed. Please refer to the Current Medication list given to you today.  *If you need a refill on your cardiac medications before your next appointment, please call your pharmacy*  Lab Work:   Testing/Procedures:  NONE    NONE  Special Instructions PLEASE READ AND FOLLOW SALTY 6-ATTACHED-1,800mg  daily  PLEASE INCREASE PHYSICAL ACTIVITY AS TOLERATED-USE RECUMBENT BIKE IF POSSIBLE  Follow-Up: Your next appointment:  6 month(s) In Person with Peter Swaziland, MD OR IF UNAVAILABLE JESSE CLEAVER, FNP-C  At [facility], you and your health needs are our priority.  As part of our continuing mission to provide you with exceptional heart care, we have created designated Provider Care Teams.  These Care Teams include your primary Cardiologist (physician) and Advanced Practice Providers (APPs -  Physician Assistants and Nurse Practitioners) who all work together to provide you with the care you need, when you need it.            6 SALTY THINGS TO AVOID     1,800MG  DAILY

## 2020-04-23 NOTE — Telephone Encounter (Signed)
Opened in error

## 2020-05-09 ENCOUNTER — Other Ambulatory Visit: Payer: Self-pay | Admitting: Neurology

## 2020-05-09 NOTE — Telephone Encounter (Signed)
Pt is up to date on his appts. Pt is due for a refill on klonopin since his RX is over 34 months old. [place] Controlled Substance Registry checked and is appropriate. Sent to [facility] in Dr. Oliva Bustard absence.

## 2020-06-11 ENCOUNTER — Other Ambulatory Visit: Payer: Self-pay | Admitting: Internal Medicine

## 2020-07-09 ENCOUNTER — Ambulatory Visit: Payer: Medicare Other | Admitting: Internal Medicine

## 2020-07-09 ENCOUNTER — Other Ambulatory Visit: Payer: Self-pay

## 2020-07-09 ENCOUNTER — Encounter: Payer: Self-pay | Admitting: Internal Medicine

## 2020-07-09 VITALS — BP 144/80 | HR 80 | Temp 98.0°F | Resp 18 | Ht 64.0 in | Wt 157.2 lb

## 2020-07-09 DIAGNOSIS — I10 Essential (primary) hypertension: Secondary | ICD-10-CM | POA: Diagnosis not present

## 2020-07-09 DIAGNOSIS — Z9861 Coronary angioplasty status: Secondary | ICD-10-CM

## 2020-07-09 DIAGNOSIS — Z09 Encounter for follow-up examination after completed treatment for conditions other than malignant neoplasm: Secondary | ICD-10-CM | POA: Diagnosis not present

## 2020-07-09 DIAGNOSIS — F0390 Unspecified dementia without behavioral disturbance: Secondary | ICD-10-CM

## 2020-07-09 DIAGNOSIS — I351 Nonrheumatic aortic (valve) insufficiency: Secondary | ICD-10-CM | POA: Diagnosis not present

## 2020-07-09 DIAGNOSIS — I2510 Atherosclerotic heart disease of native coronary artery without angina pectoris: Secondary | ICD-10-CM | POA: Diagnosis not present

## 2020-07-09 MED ORDER — TRIAMCINOLONE ACETONIDE 0.1 % EX LOTN: 1.0000 "application " | TOPICAL_LOTION | Freq: Two times a day (BID) | CUTANEOUS | 3 refills | Status: DC | PRN

## 2020-07-09 NOTE — Progress Notes (Unsigned)
Pre visit review using our clinic review tool, if applicable. No additional management support is needed unless otherwise documented below in the visit note. 

## 2020-07-09 NOTE — Patient Instructions (Signed)
For dry skin: Use over-the-counter Eucerin  For the rash at the back use Kenalog lotion twice a day as needed  Consider getting Shingrix (the shingles vaccine) at your local pharmacy  GO TO THE FRONT DESK, PLEASE SCHEDULE YOUR APPOINTMENTS Come back for a physical exam in 3 months, fasting   Fall Prevention in the Home, Adult Falls can cause injuries and can affect people from all age groups. There are many simple things that you can do to make your home safe and to help prevent falls. Ask for help when making these changes, if needed. What actions can I take to prevent falls? General instructions  Use good lighting in all rooms. Replace any light bulbs that burn out.  Turn on lights if it is dark. Use night-lights.  Place frequently used items in easy-to-reach places. Lower the shelves around your home if necessary.  Set up furniture so that there are clear paths around it. Avoid moving your furniture around.  Remove throw rugs and other tripping hazards from the floor.  Avoid walking on wet floors.  Fix any uneven floor surfaces.  Add color or contrast paint or tape to grab bars and handrails in your home. Place contrasting color strips on the first and last steps of stairways.  When you use a stepladder, make sure that it is completely opened and that the sides are firmly locked. Have someone hold the ladder while you are using it. Do not climb a closed stepladder.  Be aware of any and all pets. What can I do in the bathroom?  Keep the floor dry. Immediately clean up any water that spills onto the floor.  Remove soap buildup in the tub or shower on a regular basis.  Use non-skid mats or decals on the floor of the tub or shower.  Attach bath mats securely with double-sided, non-slip rug tape.  If you need to sit down while you are in the shower, use a plastic, non-slip stool.  Install grab bars by the toilet and in the tub and shower. Do not use towel bars as grab  bars.      What can I do in the bedroom?  Make sure that a bedside light is easy to reach.  Do not use oversized bedding that drapes onto the floor.  Have a firm chair that has side arms to use for getting dressed. What can I do in the kitchen?  Clean up any spills right away.  If you need to reach for something above you, use a sturdy step stool that has a grab bar.  Keep electrical cables out of the way.  Do not use floor polish or wax that makes floors slippery. If you must use wax, make sure that it is non-skid floor wax. What can I do in the stairways?  Do not leave any items on the stairs.  Make sure that you have a light switch at the top of the stairs and the bottom of the stairs. Have them installed if you do not have them.  Make sure that there are handrails on both sides of the stairs. Fix handrails that are broken or loose. Make sure that handrails are as long as the stairways.  Install non-slip stair treads on all stairs in your home.  Avoid having throw rugs at the top or bottom of stairways, or secure the rugs with carpet tape to prevent them from moving.  Choose a carpet design that does not hide the edge of steps  on the stairway.  Check any carpeting to make sure that it is firmly attached to the stairs. Fix any carpet that is loose or worn. What can I do on the outside of my home?  Use bright outdoor lighting.  Regularly repair the edges of walkways and driveways and fix any cracks.  Remove high doorway thresholds.  Trim any shrubbery on the main path into your home.  Regularly check that handrails are securely fastened and in good repair. Both sides of any steps should have handrails.  Install guardrails along the edges of any raised decks or porches.  Clear walkways of debris and clutter, including tools and rocks.  Have leaves, snow, and ice cleared regularly.  Use sand or salt on walkways during winter months.  In the garage, clean up any  spills right away, including grease or oil spills. What other actions can I take?  Wear closed-toe shoes that fit well and support your feet. Wear shoes that have rubber soles or low heels.  Use mobility aids as needed, such as canes, walkers, scooters, and crutches.  Review your medicines with your health care provider. Some medicines can cause dizziness or changes in blood pressure, which increase your risk of falling. Talk with your health care provider about other ways that you can decrease your risk of falls. This may include working with a physical therapist or trainer to improve your strength, balance, and endurance. Where to find more information  Centers for Disease Control and Prevention, STEADI: TVDivision.uy  [employer] on Aging: RingConnections.si Contact a health care provider if:  You are afraid of falling at home.  You feel weak, drowsy, or dizzy at home.  You fall at home. Summary  There are many simple things that you can do to make your home safe and to help prevent falls.  Ways to make your home safe include removing tripping hazards and installing grab bars in the bathroom.  Ask for help when making these changes in your home. This information is not intended to replace advice given to you by your health care provider. Make sure you discuss any questions you have with your health care provider. Document Revised: 05/15/2017 Document Reviewed: 01/15/2017 Elsevier Patient Education  2021 [employer].

## 2020-07-09 NOTE — Progress Notes (Signed)
Subjective:    Patient ID: [name], male    DOB: 1934-10-16, 86 y.o.   MRN: [id number]  DOS:  07/09/2020 Type of visit - description: Follow-up.  Here with his daughter  Has multiple concerns. BP is noted to be elevated but reports  excellent readings at home. Still have some cramps at night.  More so on the left than on the right leg. Has a rash on and off on the lower back. The patient still would like to drive, the daughter reports that he is doing a lot of things that in herr mind are unsafe,  like clearing snow from the driveway.    Review of Systems See above   Past Medical History:  Diagnosis Date  . Anginal pain (HCC)   . Arthritis    BACK & SHOULDERS  . CAD S/P percutaneous coronary angioplasty 2003   Dr Antoine Poche: 2003: a) s/p CABG (LIMA-LAD, SVG-Diag);b) 2008: prior PCI to native RCA; c) 12/12/2013: SVG-Diag occluded, patent LIMA, 95% post stent in RCA --> PCI  with 2.75 x 18 mm Xience Alpine DES, residual dRCA-PLA 80=-90%, EF 55-65%  . Diverticulosis   . GERD (gastroesophageal reflux disease)   . Gilbert's syndrome   . Heart murmur   . Hyperlipidemia   . Hypertension   . Osteopenia 2010    T score -1.9 @ hip (femoral neck)  . PUD (peptic ulcer disease)    PMH , ? 1995  . Short-term memory loss   . Shortness of breath     Past Surgical History:  Procedure Laterality Date  . CERVICAL FUSION  1997   Dr Jeral Fruit  . CHOLECYSTECTOMY  2004  . CORONARY ARTERY BYPASS GRAFT  2003   Last catheterization was in August 2008 demonstrated a LIMA to th LAD which was patent, there was an atretic saphenous vein graft to the diagonal, the LAD had a 90% stenosis in the large calcified segment, the diagonal has ostial 70% stenosis, the circumflex had a ramus intermediate with ostial 25% stenosis, the right coronary artery was dominant.  There was an ulcerated 80%-90% stenosis.    . CORONARY STENT PLACEMENT  2008   drug-eluting stent to right coronary artery  . CORONARY  STENT PLACEMENT Right 12/12/2013   RT CORONARY  DES       DR COOPER   . HEMORRHOID SURGERY    . LEFT HEART CATHETERIZATION WITH CORONARY/GRAFT ANGIOGRAM N/A 12/12/2013   Procedure: LEFT HEART CATHETERIZATION WITH Isabel Caprice;  Surgeon: Micheline Chapman, MD;  Location: [facility] CATH LAB;  Service: Cardiovascular;  Laterality: N/A;  . TONSILLECTOMY AND ADENOIDECTOMY    . UPPER GASTROINTESTINAL ENDOSCOPY      Dr Victorino Dike    Allergies as of 07/09/2020      Reactions   Codeine Nausea Only      Medication List       Accurate as of July 09, 2020 11:59 PM. If you have any questions, ask your nurse or doctor.        STOP taking these medications   augmented betamethasone dipropionate 0.05 % cream Commonly known as: DIPROLENE-AF Stopped by: [name], MD   cyclobenzaprine 10 MG tablet Commonly known as: FLEXERIL Stopped by: [name], MD     TAKE these medications   amLODipine 2.5 MG tablet Commonly known as: NORVASC Take 1 tablet (2.5 mg total) by mouth daily.   aspirin EC 81 MG tablet Take 81 mg by mouth daily.   azelastine 0.1 %  nasal spray Commonly known as: ASTELIN Place 2 sprays into both nostrils 2 (two) times daily.   carbidopa-levodopa 25-100 MG tablet Commonly known as: SINEMET IR Take 1 tablet by mouth 3 (three) times daily.   cetirizine 10 MG tablet Commonly known as: ZYRTEC Take 10 mg by mouth daily.   clonazePAM 0.5 MG tablet Commonly known as: KLONOPIN TAKE 1 TABLET BY MOUTH AT BEDTIME FOR REM SLEEP DISORDER. CAN BE USED FOR VERTIGO AS NEEDED   donepezil 10 MG tablet Commonly known as: ARICEPT Take 1 tablet (10 mg total) by mouth at bedtime.   fluticasone 50 MCG/ACT nasal spray Commonly known as: FLONASE Place into both nostrils daily.   nitroGLYCERIN 0.4 MG SL tablet Commonly known as: NITROSTAT Place 1 tablet (0.4 mg total) under the tongue every 5 (five) minutes as needed for chest pain.   PROBIOTIC DAILY PO Take 37 mg by mouth  daily.   rosuvastatin 20 MG tablet Commonly known as: CRESTOR Take 1 tablet (20 mg total) by mouth daily.   triamcinolone lotion 0.1 % Commonly known as: KENALOG Apply 1 application topically 2 (two) times daily as needed (Rash at the back). Started by: [name], MD   Vitamin D 50 MCG (2000 UT) Caps Take by mouth daily.          Objective:   Physical Exam BP (!) 144/80 (BP Location: Left Arm, Patient Position: Sitting, Cuff Size: Small)   Pulse 80   Temp 98 F (36.7 C) (Oral)   Resp 18   Ht 5\' 4"  (1.626 m)   Wt 157 lb 4 oz (71.3 kg)   SpO2 98%   BMI 26.99 kg/m  General:   Well developed, NAD, BMI noted. HEENT:  Normocephalic . Face symmetric, atraumatic Lungs:  CTA B Normal respiratory effort, no intercostal retractions, no accessory muscle use. Heart: RRR, noticeable systolic murmur Lower extremities: no pretibial edema bilaterally  Skin: Very dry skin noted from the knees down He has several small red to 3 mm, maculopapular spots at the back, not specific distribution. Neurologic:  alert & although I did not do a detailed mental exam he seems to be doing well today, oriented to self, space, circumstance. Speech normal, gait and transferring: Limited Psych--   Behavior appropriate. No anxious or depressed appearing.      Assessment    Assessment HTN Hyperlipidemia CV: dr --CAD, CABG 2003, stent 2008, cath 2015 [facility]), dc plavix 02-2015 --Aortic regurgitation, mild mod Neurologic: -Parkinson disease - Dementia OSA  --- REM sleep d/o ---> CPAP. Clonazepam qhs prn Vertigo CT head 2012 and 2015 nonacute. No help with antivert (03-2017) MSK --DJD --Severe kyphosis ---NCS 4-2017chronic radiucolopathy left C 6-7 and T1 and at L 4-5 and S1, saw neuro, rx conservative treatment  ---Osteopenia, nl vit D 2013, T score -2.0 (04-2016)  GERD, h/o PUD    PLAN HTN: On amlodipine, BP today is elevated, BPs at home are consistently and frequently  check and he has excellent readings.  No change. Dementia, Parkinson Saw neurology 04/16/2020, they noted short-term memory, increased motor tone cogwheeling, increased tremor with action and a newly elevated BP. 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 No changes recommended. Rash, dry skin: Unclear etiology of the rash on the back, bedbugs?  Recommend Kenalog lotion.  Eucerin for dry skin. Leg cramps: Recommend tonic water CAD: Saw cardiology 04/20/2020, stable, no changes made. Arctic insufficiency: Last Echo 04/04/2020, saw cards, recommend monitoring Preventive  care: - Interested in Shingrix, I see no problem, recommend to get it at the pharmacy - Safety: We had a very extensive discussion about safety, the patient would like to drive again and continue doing things like  clearing snow from the driveway. While I like him to be physically active, I do not think those are safe activities We also talk about the healthcare power of attorney ( it is her daughter) rec to bring a copy for his chart file. RTC 3 months CPX fasting   Time spent 42 minutes, see comments about preventive care and safety discussion.  This visit occurred during the SARS-CoV-2 public health emergency.  Safety protocols were in place, including screening questions prior to the visit, additional usage of staff PPE, and extensive cleaning of exam room while observing appropriate contact time as indicated for disinfecting solutions.

## 2020-07-10 NOTE — Assessment & Plan Note (Signed)
HTN: On amlodipine, BP today is elevated, BPs at home are consistently and frequently check and he has excellent readings.  No change. Dementia, Parkinson Saw neurology 04/16/2020, they noted short-term memory, increased motor tone cogwheeling, increased tremor with action and a newly elevated BP. 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 No changes recommended. Rash, dry skin: Unclear etiology of the rash on the back, bedbugs?  Recommend Kenalog lotion.  Eucerin for dry skin. Leg cramps: Recommend tonic water CAD: Saw cardiology 04/20/2020, stable, no changes made. Arctic insufficiency: Last Echo 04/04/2020, saw cards, recommend monitoring Preventive care: - Interested in Shingrix, I see no problem, recommend to get it at the pharmacy - Safety: We had a very extensive discussion about safety, the patient would like to drive again and continue doing things like  clearing snow from the driveway. While I like him to be physically active, I do not think those are safe activities We also talk about the healthcare power of attorney ( it is her daughter) rec to bring a copy for his chart file. RTC 3 months CPX fasting

## 2020-07-24 ENCOUNTER — Encounter: Payer: Self-pay | Admitting: Neurology

## 2020-08-09 ENCOUNTER — Other Ambulatory Visit: Payer: Self-pay | Admitting: Neurology

## 2020-08-17 ENCOUNTER — Other Ambulatory Visit: Payer: Self-pay

## 2020-08-20 MED ORDER — CLONAZEPAM 0.5 MG PO TABS
ORAL_TABLET | ORAL | 0 refills | Status: DC
Start: 2020-08-20 — End: 2020-11-22

## 2020-08-22 ENCOUNTER — Other Ambulatory Visit: Payer: Self-pay

## 2020-08-22 MED ORDER — AMLODIPINE BESYLATE 2.5 MG PO TABS
2.5000 mg | ORAL_TABLET | Freq: Every day | ORAL | 1 refills | Status: DC
Start: 2020-08-22 — End: 2021-01-21

## 2020-08-22 MED ORDER — ROSUVASTATIN CALCIUM 20 MG PO TABS: 20.0000 mg | ORAL_TABLET | Freq: Every day | ORAL | 3 refills | Status: DC

## 2020-08-22 MED ORDER — DONEPEZIL HCL 10 MG PO TABS: 10.0000 mg | ORAL_TABLET | Freq: Every day | ORAL | 1 refills | Status: DC

## 2020-09-05 ENCOUNTER — Other Ambulatory Visit: Payer: Self-pay | Admitting: Cardiology

## 2020-09-19 ENCOUNTER — Ambulatory Visit: Payer: Medicare Other | Attending: Internal Medicine

## 2020-09-19 DIAGNOSIS — Z23 Encounter for immunization: Secondary | ICD-10-CM

## 2020-09-19 NOTE — Progress Notes (Signed)
   Covid-19 Vaccination Clinic  Name:  NEMIAH KISSNER    MRN: [id number] DOB: 06-26-1934  09/19/2020  Mr. Lincks was observed post Covid-19 immunization for 15 minutes without incident. He was provided with Vaccine Information Sheet and instruction to access the V-Safe system.   Mr. Ehle was instructed to call 911 with any severe reactions post vaccine: [name] Difficulty breathing  . Swelling of face and throat  . A fast heartbeat  . A bad rash all over body  . Dizziness and weakness   Immunizations Administered    Name Date Dose VIS Date Route   Moderna Covid-19 Booster Vaccine 09/19/2020  1:50 PM 0.25 mL 04/04/2020 Intramuscular   Manufacturer: Gala Murdoch   Lot: [id number]   NDC: 81771-165-79

## 2020-09-24 ENCOUNTER — Encounter: Payer: Self-pay | Admitting: Neurology

## 2020-09-25 ENCOUNTER — Other Ambulatory Visit (HOSPITAL_BASED_OUTPATIENT_CLINIC_OR_DEPARTMENT_OTHER): Payer: Self-pay

## 2020-09-25 MED ORDER — MODERNA COVID-19 VACCINE 100 MCG/0.5ML IM SUSP
INTRAMUSCULAR | 0 refills | Status: DC
  Filled 2020-09-25: qty 0.25, 1d supply, fill #0

## 2020-10-09 ENCOUNTER — Other Ambulatory Visit: Payer: Self-pay

## 2020-10-10 ENCOUNTER — Ambulatory Visit: Payer: Medicare Other | Admitting: Internal Medicine

## 2020-10-10 ENCOUNTER — Encounter: Payer: Self-pay | Admitting: Internal Medicine

## 2020-10-10 ENCOUNTER — Other Ambulatory Visit: Payer: Self-pay

## 2020-10-10 VITALS — BP 147/87 | HR 66 | Temp 97.2°F | Ht 64.0 in | Wt 150.0 lb

## 2020-10-10 DIAGNOSIS — Z0000 Encounter for general adult medical examination without abnormal findings: Secondary | ICD-10-CM

## 2020-10-10 LAB — LIPID PANEL
Cholesterol: 153 mg/dL (ref 0–200)
HDL: 61 mg/dL (ref >39.00)
LDL Cholesterol: 72 mg/dL (ref 0–99)
NonHDL: 91.53
Total CHOL/HDL Ratio: 3
Triglycerides: 100 mg/dL (ref 0.0–149.0)
VLDL: 20 mg/dL (ref 0.0–40.0)

## 2020-10-10 LAB — CBC WITH DIFFERENTIAL/PLATELET
Basophils Absolute: 0 10*3/uL (ref 0.0–0.1)
Basophils Relative: 0.9 % (ref 0.0–3.0)
Eosinophils Absolute: 0.4 10*3/uL (ref 0.0–0.7)
Eosinophils Relative: 6.7 % — ABNORMAL HIGH (ref 0.0–5.0)
HCT: 44.6 % (ref 39.0–52.0)
Hemoglobin: 15.4 g/dL (ref 13.0–17.0)
Lymphocytes Relative: 25.9 % (ref 12.0–46.0)
Lymphs Abs: 1.4 10*3/uL (ref 0.7–4.0)
MCHC: 34.6 g/dL (ref 30.0–36.0)
MCV: 89.8 fl (ref 78.0–100.0)
Monocytes Absolute: 0.5 10*3/uL (ref 0.1–1.0)
Monocytes Relative: 9.5 % (ref 3.0–12.0)
Neutro Abs: 3.2 10*3/uL (ref 1.4–7.7)
Neutrophils Relative %: 57 % (ref 43.0–77.0)
Platelets: 239 10*3/uL (ref 150.0–400.0)
RBC: 4.96 Mil/uL (ref 4.22–5.81)
RDW: 12.8 % (ref 11.5–15.5)
WBC: 5.6 10*3/uL (ref 4.0–10.5)

## 2020-10-10 LAB — COMPREHENSIVE METABOLIC PANEL
ALT: 6 U/L (ref 0–53)
AST: 17 U/L (ref 0–37)
Albumin: 5 g/dL (ref 3.5–5.2)
Alkaline Phosphatase: 65 U/L (ref 39–117)
BUN: 13 mg/dL (ref 6–23)
CO2: 31 mEq/L (ref 19–32)
Calcium: 10.5 mg/dL (ref 8.4–10.5)
Chloride: 97 mEq/L (ref 96–112)
Creatinine, Ser: 0.78 mg/dL (ref 0.40–1.50)
GFR: 82.52 mL/min (ref >60.00)
Glucose, Bld: 85 mg/dL (ref 70–99)
Potassium: 4.4 mEq/L (ref 3.5–5.1)
Sodium: 136 mEq/L (ref 135–145)
Total Bilirubin: 1.7 mg/dL — ABNORMAL HIGH (ref 0.2–1.2)
Total Protein: 8.2 g/dL (ref 6.0–8.3)

## 2020-10-10 MED ORDER — SHINGRIX 50 MCG/0.5ML IM SUSR: 0.5000 mL | Freq: Once | INTRAMUSCULAR | 1 refills | Status: AC

## 2020-10-10 NOTE — Progress Notes (Signed)
Subjective:    Patient ID: [name], male    DOB: Jul 30, 1934, 86 y.o.   MRN: [id number]  DOS:  10/10/2020 Type of visit - description: Here for CPX  with his daughter [name]  In general feels well. Did report some runny nose and cough in the morning. He is a still active, takes occasional walk about half a mile without chest pain or difficulty breathing.  No claudication. He does have occasional constipation but no diarrhea   Review of Systems  Other than above, a 14 point review of systems is negative      Past Medical History:  Diagnosis Date  . Anginal pain (HCC)   . Arthritis    BACK & SHOULDERS  . CAD S/P percutaneous coronary angioplasty 2003   Dr Antoine Poche: 2003: a) s/p CABG (LIMA-LAD, SVG-Diag);b) 2008: prior PCI to native RCA; c) 12/12/2013: SVG-Diag occluded, patent LIMA, 95% post stent in RCA --> PCI  with 2.75 x 18 mm Xience Alpine DES, residual dRCA-PLA 80=-90%, EF 55-65%  . Diverticulosis   . GERD (gastroesophageal reflux disease)   . Gilbert's syndrome   . Heart murmur   . Hyperlipidemia   . Hypertension   . Osteopenia 2010    T score -1.9 @ hip (femoral neck)  . PUD (peptic ulcer disease)    PMH , ? 1995  . Short-term memory loss   . Shortness of breath     Past Surgical History:  Procedure Laterality Date  . CERVICAL FUSION  1997   Dr Jeral Fruit  . CHOLECYSTECTOMY  2004  . CORONARY ARTERY BYPASS GRAFT  2003   Last catheterization was in August 2008 demonstrated a LIMA to th LAD which was patent, there was an atretic saphenous vein graft to the diagonal, the LAD had a 90% stenosis in the large calcified segment, the diagonal has ostial 70% stenosis, the circumflex had a ramus intermediate with ostial 25% stenosis, the right coronary artery was dominant.  There was an ulcerated 80%-90% stenosis.    . CORONARY STENT PLACEMENT  2008   drug-eluting stent to right coronary artery  . CORONARY STENT PLACEMENT Right 12/12/2013   RT CORONARY  DES       DR  COOPER   . HEMORRHOID SURGERY    . LEFT HEART CATHETERIZATION WITH CORONARY/GRAFT ANGIOGRAM N/A 12/12/2013   Procedure: LEFT HEART CATHETERIZATION WITH Isabel Caprice;  Surgeon: Micheline Chapman, MD;  Location: [facility] CATH LAB;  Service: Cardiovascular;  Laterality: N/A;  . TONSILLECTOMY AND ADENOIDECTOMY    . UPPER GASTROINTESTINAL ENDOSCOPY      Dr Victorino Dike    Allergies as of 10/10/2020      Reactions   Codeine Nausea Only      Medication List       Accurate as of October 10, 2020 11:59 PM. If you have any questions, ask your nurse or doctor.        amLODipine 2.5 MG tablet Commonly known as: NORVASC Take 1 tablet (2.5 mg total) by mouth daily.   aspirin EC 81 MG tablet Take 81 mg by mouth daily.   azelastine 0.1 % nasal spray Commonly known as: ASTELIN Place 2 sprays into both nostrils 2 (two) times daily.   carbidopa-levodopa 25-100 MG tablet Commonly known as: SINEMET IR Take 1 tablet by mouth 3 (three) times daily.   cetirizine 10 MG tablet Commonly known as: ZYRTEC Take 10 mg by mouth daily.   clonazePAM 0.5 MG tablet Commonly known as: Scarlette Calico  TAKE 1 TABLET BY MOUTH AT BEDTIME FOR REM SLEEP DISORDER. CAN BE USED FOR VERTIGO AS NEEDED.   donepezil 10 MG tablet Commonly known as: ARICEPT Take 1 tablet (10 mg total) by mouth at bedtime.   fluticasone 50 MCG/ACT nasal spray Commonly known as: FLONASE Place into both nostrils daily.   nitroGLYCERIN 0.4 MG SL tablet Commonly known as: NITROSTAT Place 1 tablet (0.4 mg total) under the tongue every 5 (five) minutes as needed for chest pain.   PROBIOTIC DAILY PO Take 37 mg by mouth daily.   rosuvastatin 20 MG tablet Commonly known as: CRESTOR Take 1 tablet (20 mg total) by mouth daily.   Shingrix injection Generic drug: Zoster Vaccine Adjuvanted Inject 0.5 mLs into the muscle once for 1 dose. Started by: [name], MD   triamcinolone lotion 0.1 % Commonly known as: KENALOG Apply 1 application  topically 2 (two) times daily as needed (Rash at the back).   Vitamin D 50 MCG (2000 UT) Caps Take by mouth daily.          Objective:   Physical Exam BP (!) 147/87 (BP Location: Left Arm, Patient Position: Sitting, Cuff Size: Large)   Pulse 66   Temp (!) 97.2 F (36.2 C) (Temporal)   Ht 5\' 4"  (1.626 m)   Wt 150 lb (68 kg)   SpO2 98%   BMI 25.75 kg/m  General: Well developed, NAD, BMI noted Neck: No  thyromegaly  HEENT:  Normocephalic . Face symmetric, atraumatic Lungs:  CTA B Normal respiratory effort, no intercostal retractions, no accessory muscle use. Heart: RRR, + murmur noted.  Abdomen:  Not distended, soft, non-tender. No rebound or rigidity.   Lower extremities: no pretibial edema bilaterally.  Good pedal pulses bilaterally Skin: Exposed areas without rash. Not pale. Not jaundice Neurologic:  alert & oriented X3.  Speech normal, gait appropriate for age and unassisted Strength symmetric and appropriate for age. MSK: Kyphosis noted Psych: Cognition and judgment appear intact.  Cooperative with normal attention span and concentration.  Behavior appropriate. No anxious or depressed appearing.     Assessment      Assessment HTN Hyperlipidemia CV: dr --CAD, CABG 2003, stent 2008, cath 2015 [facility]), dc plavix 02-2015 --Aortic regurgitation, mild mod Neurologic: -Parkinson disease - Dementia OSA  --- REM sleep d/o ---> CPAP. Clonazepam qhs prn Vertigo CT head 2012 and 2015 nonacute. No help with antivert (03-2017) MSK --DJD --Severe kyphosis ---NCS 4-2017chronic radiucolopathy left C 6-7 and T1 and at L 4-5 and S1, saw neuro, rx conservative treatment  ---Osteopenia, nl vit D 2013, T score -2.0 (04-2016)  GERD, h/o PUD    PLAN Here for CPX HTN: BP today slightly elevated, at home consistently around 127/60.  Continue amlodipine. Hyperlipidemia: On Crestor, checking labs CAD, aortic regurg: Seems to be doing well.  No changes Dementia:  Seems to be doing well. RTC 6 months   This visit occurred during the SARS-CoV-2 public health emergency.  Safety protocols were in place, including screening questions prior to the visit, additional usage of staff PPE, and extensive cleaning of exam room while observing appropriate contact time as indicated for disinfecting solutions.

## 2020-10-10 NOTE — Patient Instructions (Signed)
Check the  blood pressure as you are doing BP GOAL is between 110/65 and  135/85. If it is consistently higher or lower, let me know  Proceed w/ shingrix   GO TO THE LAB : Get the blood work     GO TO THE FRONT DESK, PLEASE SCHEDULE YOUR APPOINTMENTS Come back for  A check up in 6 months      "Living will", "Health Care Power of attorney": Advanced care planning  (If you already have a living will or healthcare power of attorney, please bring the copy to be scanned in your chart.)  Advance care planning is a process that supports adults in  understanding and sharing their preferences regarding future medical care.   The patient's preferences are recorded in documents called Advance Directives.    Advanced directives are completed (and can be modified at any time) while the patient is in full mental capacity.   The documentation should be available at all times to the patient, the family and the healthcare providers.  Bring in a copy to be scanned in your chart is an excellent idea and is recommended   This legal documents direct treatment decision making and/or appoint a surrogate to make the decision if the patient is not capable to do so.    Advance directives can be documented in many types of formats,  documents have names such as:  Lliving will  Durable power of attorney for healthcare (healthcare proxy or healthcare power of attorney)  Combined directives  Physician orders for life-sustaining treatment    More information at:  [email or web address]

## 2020-10-11 ENCOUNTER — Encounter: Payer: Self-pay | Admitting: Internal Medicine

## 2020-10-11 NOTE — Assessment & Plan Note (Signed)
Td 2017 Prevnar 2016; PNM 23 2017 Shingrex: discussed, okay to proceed at the pharmacy S/p covid vaccine x4 CCS, prostate cancer screening: See previous entries, no further screenings Labs: CMP, CBC, FLP. Fall prevention: Extensively discussed at the last visit and reinforced today. Advance care, POA discussed

## 2020-10-11 NOTE — Assessment & Plan Note (Signed)
Here for CPX HTN: BP today slightly elevated, at home consistently around 127/60.  Continue amlodipine. Hyperlipidemia: On Crestor, checking labs CAD, aortic regurg: Seems to be doing well.  No changes Dementia: Seems to be doing well. RTC 6 months

## 2020-10-22 ENCOUNTER — Ambulatory Visit: Payer: Medicare Other | Admitting: Neurology

## 2020-11-02 NOTE — Progress Notes (Signed)
[name] Date of Birth: Aug 06, 1934 Medical Record #[id number]  History of Present Illness: [name] is seen for follow up CAD. He is s/p CABG in 2003 by Dr. Cornelius Moras with LIMA to the LAD and SVG to the Diagonal. By cath in 2008 he had a high grade proximal RCA stenosis and had stenting with a 2.75 mm Promus stent. He was noted to have an atretic SVG to the diagonal but it was felt that antegrade flow into the diagonal was OK.  Myoview in November 2012 showed a fixed apical defect without ischemia and EF 62%.   In May 2015 he presented with unstable angina. Cardiac cath showed patent LIMA to the LAD and occlusion of SVG to the diagonal. There was a severe stenosis in the RCA which was stented with a DES. He also had an Echo in 9/15 for a murmur and has AV sclerosis with mild to moderate AI.  He was seen in the ED in [name] in September 2018 for evaluation of dyspnea, nausea, hearburn,  and vertigo. Evaluation with blood work and Ecg unremarkable. CT chest negative for PE or other acute findings. He was treated for vertigo with resolution.   Carotid dopplers in March 2021 showed no obstructive disease. He was seen by Dr Vickey Huger with concern of possible TIA. He had garbled speech and confusion. BP was elevated to 200/100. Started on low dose amlodipine. Ecg, CXR and labs were unremarkable. MRI showed no acute change.    On follow up today he is doing fairly well.  He is seen with his daughter. BP has been well controlled on low dose amlodipine. He is not as active as he once was - attributes this to his memory loss and Parkinson's disease. Has some chronic dyspnea. No edema or palpitations. No chest pain.  Allergies as of 11/05/2020      Reactions   Codeine Nausea Only      Medication List       Accurate as of Nov 05, 2020  4:34 PM. If you have any questions, ask your nurse or doctor.        STOP taking these medications   azelastine 0.1 % nasal spray Commonly known as:  ASTELIN Stopped by: Halcyon Heck [place], MD     TAKE these medications   amLODipine 2.5 MG tablet Commonly known as: NORVASC Take 1 tablet (2.5 mg total) by mouth daily.   aspirin EC 81 MG tablet Take 81 mg by mouth daily.   carbidopa-levodopa 25-100 MG tablet Commonly known as: SINEMET IR Take 1 tablet by mouth 3 (three) times daily.   cetirizine 10 MG tablet Commonly known as: ZYRTEC Take 10 mg by mouth daily.   clonazePAM 0.5 MG tablet Commonly known as: KLONOPIN TAKE 1 TABLET BY MOUTH AT BEDTIME FOR REM SLEEP DISORDER. CAN BE USED FOR VERTIGO AS NEEDED.   donepezil 10 MG tablet Commonly known as: ARICEPT Take 1 tablet (10 mg total) by mouth at bedtime.   Fish Oil 1000 MG Caps   fluticasone 50 MCG/ACT nasal spray Commonly known as: FLONASE Place into both nostrils daily.   nitroGLYCERIN 0.4 MG SL tablet Commonly known as: NITROSTAT Place 1 tablet (0.4 mg total) under the tongue every 5 (five) minutes as needed for chest pain.   PROBIOTIC DAILY PO Take 37 mg by mouth daily.   rosuvastatin 20 MG tablet Commonly known as: CRESTOR Take 1 tablet (20 mg total) by mouth daily.   triamcinolone lotion 0.1 % Commonly  known as: KENALOG Apply 1 application topically 2 (two) times daily as needed (Rash at the back).   Vitamin D 50 MCG (2000 UT) Caps Take by mouth daily.        Allergies  Allergen Reactions  . Codeine Nausea Only    Past Medical History:  Diagnosis Date  . Anginal pain (HCC)   . Arthritis    BACK & SHOULDERS  . CAD S/P percutaneous coronary angioplasty 2003   Dr Antoine Poche: 2003: a) s/p CABG (LIMA-LAD, SVG-Diag);b) 2008: prior PCI to native RCA; c) 12/12/2013: SVG-Diag occluded, patent LIMA, 95% post stent in RCA --> PCI  with 2.75 x 18 mm Xience Alpine DES, residual dRCA-PLA 80=-90%, EF 55-65%  . Diverticulosis   . GERD (gastroesophageal reflux disease)   . Gilbert's syndrome   . Heart murmur   . Hyperlipidemia   . Hypertension   . Osteopenia  2010    T score -1.9 @ hip (femoral neck)  . PUD (peptic ulcer disease)    PMH , ? 1995  . Short-term memory loss   . Shortness of breath     Past Surgical History:  Procedure Laterality Date  . CERVICAL FUSION  1997   Dr Jeral Fruit  . CHOLECYSTECTOMY  2004  . CORONARY ARTERY BYPASS GRAFT  2003   Last catheterization was in August 2008 demonstrated a LIMA to th LAD which was patent, there was an atretic saphenous vein graft to the diagonal, the LAD had a 90% stenosis in the large calcified segment, the diagonal has ostial 70% stenosis, the circumflex had a ramus intermediate with ostial 25% stenosis, the right coronary artery was dominant.  There was an ulcerated 80%-90% stenosis.    . CORONARY STENT PLACEMENT  2008   drug-eluting stent to right coronary artery  . CORONARY STENT PLACEMENT Right 12/12/2013   RT CORONARY  DES       DR COOPER   . HEMORRHOID SURGERY    . LEFT HEART CATHETERIZATION WITH CORONARY/GRAFT ANGIOGRAM N/A 12/12/2013   Procedure: LEFT HEART CATHETERIZATION WITH Isabel Caprice;  Surgeon: Micheline Chapman, MD;  Location: [name] CATH LAB;  Service: Cardiovascular;  Laterality: N/A;  . TONSILLECTOMY AND ADENOIDECTOMY    . UPPER GASTROINTESTINAL ENDOSCOPY      Dr Victorino Dike    Social History   Socioeconomic History  . Marital status: Married    Spouse name: [name]  . Number of children: 2  . Years of education: Bachelors  . Highest education level: Not on file  Occupational History  . Occupation: retired     Associate Professor: RETIRED  Tobacco Use  . Smoking status: Former Smoker    Quit date: 02/25/1979    Years since quitting: 41.7  . Smokeless tobacco: Never Used  . Tobacco comment: smoked 1955-1980, up to 1 ppd  Substance and Sexual Activity  . Alcohol use: Yes    Comment: 1/2 cup / day of wine or 1 can beer/day  . Drug use: No  . Sexual activity: Never  Other Topics Concern  . Not on file  Social History Narrative   Patient is married ([name]) and  lives at home with his wife and daughter [name] .   Patient has two adult children- daughters x 2  ([name], [name])   Patient is retired.   Patient has a Bachelor's degree.   Patient is right-handed.   Patient drinks 1/2 liter of green tea daily.   Walks once week for exercise   Born in [place]  Social Determinants of Health   Financial Resource Strain: Not on file  Food Insecurity: Not on file  Transportation Needs: Not on file  Physical Activity: Not on file  Stress: Not on file  Social Connections: Not on file    Family History  Problem Relation Age of Onset  . Cancer Father        ? primary  . Uterine cancer Daughter   . Hypertension Maternal Grandmother   . Peripheral vascular disease Maternal Grandmother   . Osteoporosis Sister   . Diabetes Sister   . Stroke Neg Hx   . Heart attack Neg Hx   . Colon cancer Neg Hx   . Prostate cancer Neg Hx     Review of Systems: As noted in HPI.  All other systems were reviewed and are negative.  Physical Exam: BP 118/78   Pulse 69   Ht 5\' 2"  (1.575 m)   Wt 151 lb (68.5 kg)   SpO2 97%   BMI 27.62 kg/m  Filed Weights   11/05/20 1608  Weight: 151 lb (68.5 kg)  GENERAL:  Well appearing elderly WM in NAD HEENT:  PERRL, EOMI, sclera are clear. Oropharynx is clear. NECK:  No jugular venous distention, carotid upstroke brisk and symmetric, no bruits, no thyromegaly or adenopathy LUNGS:  Clear to auscultation bilaterally CHEST:  Unremarkable, kyphotic HEART:  RRR,  PMI not displaced or sustained,S1 and S2 within normal limits, no S3, no S4: no clicks, no rubs, gr 2/6 systolic murmur at the apex>> RUSB. ABD:  Soft, nontender. BS +, no masses or bruits. No hepatomegaly, no splenomegaly EXT:  2 + pulses throughout, no edema, no cyanosis no clubbing SKIN:  Warm and dry.  No rashes NEURO:  Alert and oriented x 3. Cranial nerves II through XII intact. PSYCH:  Cognitively intact   LABORATORY DATA:  Lab Results  Component  Value Date   WBC 5.6 10/10/2020   HGB 15.4 10/10/2020   HCT 44.6 10/10/2020   PLT 239.0 10/10/2020   GLUCOSE 85 10/10/2020   CHOL 153 10/10/2020   TRIG 100.0 10/10/2020   HDL 61.00 10/10/2020   LDLCALC 72 10/10/2020   ALT 6 10/10/2020   AST 17 10/10/2020   NA 136 10/10/2020   K 4.4 10/10/2020   CL 97 10/10/2020   CREATININE 0.78 10/10/2020   BUN 13 10/10/2020   CO2 31 10/10/2020   TSH 0.71 03/13/2020   PSA 0.98 06/04/2006   INR 0.98 01/15/2014   HGBA1C 5.6 10/26/2014   Ecg today shows NSR rate 69. Possible old septal infarct. I have personally reviewed and interpreted this study.  Assessment / Plan: 1. CAD. Known CAD s/p CABG in 2003 and subsequent stenting of native RCA 2008. DES of the proximal RCA in June 2015.  He has no significant angina.  Continue ASA.  2. Mild to moderate AI.  Normal LV size and function. Asymptomatic.   3. Hyperlipidemia- on statin.Excellent control.   4. Back/hip pain.secondary to kyphosis.  5. Memory loss and Parkinson's disease. Followed by Dr July 2015.   6. Labile HTN. Improved on low dose amlodipine.     Follow up in 6 months.

## 2020-11-05 ENCOUNTER — Encounter: Payer: Self-pay | Admitting: Cardiology

## 2020-11-05 ENCOUNTER — Ambulatory Visit (INDEPENDENT_AMBULATORY_CARE_PROVIDER_SITE_OTHER): Payer: Medicare Other | Admitting: Cardiology

## 2020-11-05 ENCOUNTER — Ambulatory Visit: Payer: Medicare Other | Admitting: Neurology

## 2020-11-05 ENCOUNTER — Other Ambulatory Visit: Payer: Self-pay

## 2020-11-05 VITALS — BP 118/78 | HR 69 | Ht 62.0 in | Wt 151.0 lb

## 2020-11-05 DIAGNOSIS — E782 Mixed hyperlipidemia: Secondary | ICD-10-CM

## 2020-11-05 DIAGNOSIS — I2510 Atherosclerotic heart disease of native coronary artery without angina pectoris: Secondary | ICD-10-CM | POA: Diagnosis not present

## 2020-11-05 DIAGNOSIS — I351 Nonrheumatic aortic (valve) insufficiency: Secondary | ICD-10-CM

## 2020-11-05 DIAGNOSIS — R0989 Other specified symptoms and signs involving the circulatory and respiratory systems: Secondary | ICD-10-CM

## 2020-11-06 ENCOUNTER — Ambulatory Visit (INDEPENDENT_AMBULATORY_CARE_PROVIDER_SITE_OTHER): Payer: Medicare Other | Admitting: Neurology

## 2020-11-06 ENCOUNTER — Encounter: Payer: Self-pay | Admitting: Neurology

## 2020-11-06 VITALS — BP 133/76 | HR 68 | Ht 64.0 in | Wt 151.0 lb

## 2020-11-06 DIAGNOSIS — R293 Abnormal posture: Secondary | ICD-10-CM | POA: Diagnosis not present

## 2020-11-06 DIAGNOSIS — G4731 Primary central sleep apnea: Secondary | ICD-10-CM | POA: Diagnosis not present

## 2020-11-06 DIAGNOSIS — G20 Parkinson's disease: Secondary | ICD-10-CM | POA: Diagnosis not present

## 2020-11-06 DIAGNOSIS — R1310 Dysphagia, unspecified: Secondary | ICD-10-CM

## 2020-11-06 DIAGNOSIS — G3184 Mild cognitive impairment, so stated: Secondary | ICD-10-CM
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
## 2020-11-08 ENCOUNTER — Other Ambulatory Visit (HOSPITAL_COMMUNITY): Payer: Self-pay

## 2020-11-08 DIAGNOSIS — R1310 Dysphagia, unspecified: Secondary | ICD-10-CM

## 2020-11-15 ENCOUNTER — Ambulatory Visit (HOSPITAL_COMMUNITY)
Admission: RE | Admit: 2020-11-15 | Discharge: 2020-11-15 | Disposition: A | Discharge status: Home / Self Care | Payer: Medicare Other | Source: Ambulatory Visit | Attending: Neurology | Admitting: Neurology

## 2020-11-15 ENCOUNTER — Other Ambulatory Visit: Payer: Self-pay

## 2020-11-15 DIAGNOSIS — R293 Abnormal posture: Secondary | ICD-10-CM | POA: Insufficient documentation

## 2020-11-15 DIAGNOSIS — R1310 Dysphagia, unspecified: Secondary | ICD-10-CM | POA: Insufficient documentation

## 2020-11-15 DIAGNOSIS — G3184 Mild cognitive impairment, so stated: Secondary | ICD-10-CM | POA: Diagnosis present

## 2020-11-15 DIAGNOSIS — G20 Parkinson's disease: Secondary | ICD-10-CM | POA: Diagnosis present

## 2020-11-15 DIAGNOSIS — G4731 Primary central sleep apnea: Secondary | ICD-10-CM | POA: Insufficient documentation

## 2020-11-15 NOTE — Progress Notes (Signed)
Patient presents with functional oropharyngeal swallow ability without aspiration or frank laryngeal penetration.  Oral transiting, pharyngeal swallow timing was all [facility].  Swallow was strong without retention.    He did require extra liquid boluses to transit barium tablet as he report it "stuck to his dentures".   Flash penetration of thin noted when taking tablet but this adequately cleared.      UN IMPAIRED pharyngeal swallowing.

## 2020-11-19 ENCOUNTER — Telehealth: Payer: Self-pay | Admitting: *Deleted

## 2020-11-19 NOTE — Telephone Encounter (Signed)
I spoke to his dgt on DPR, [employer]. She verbalized understanding of the findings.

## 2020-11-19 NOTE — Telephone Encounter (Signed)
-----   Message from Melvyn Novas, MD sent at 11/15/2020  4:55 PM EDT -----  Patient presents with functional oropharyngeal swallow ability without aspiration or frank laryngeal penetration.  Oral transiting, pharyngeal swallow timing was all [facility].  Swallow was strong without retention.    He did require extra liquid boluses to transit barium tablet as he report it "stuck to his dentures".   Flash penetration of thin noted when taking tablet but this adequately cleared.      UN IMPAIRED pharyngeal swallowing.

## 2020-11-20 ENCOUNTER — Other Ambulatory Visit: Payer: Self-pay | Admitting: Neurology

## 2020-11-22 ENCOUNTER — Other Ambulatory Visit: Payer: Self-pay | Admitting: Neurology

## 2020-11-22 MED ORDER — CLONAZEPAM 0.5 MG PO TABS: ORAL_TABLET | ORAL | 0 refills | Status: DC

## 2020-11-28 ENCOUNTER — Encounter: Payer: Self-pay | Admitting: Internal Medicine

## 2020-11-28 NOTE — Telephone Encounter (Signed)
Letter faxed to number provided.

## 2021-01-20 ENCOUNTER — Other Ambulatory Visit: Payer: Self-pay | Admitting: Internal Medicine

## 2021-02-06 ENCOUNTER — Ambulatory Visit (INDEPENDENT_AMBULATORY_CARE_PROVIDER_SITE_OTHER): Payer: Medicare Other | Admitting: Neurology

## 2021-02-06 ENCOUNTER — Other Ambulatory Visit: Payer: Self-pay | Admitting: *Deleted

## 2021-02-06 ENCOUNTER — Encounter: Payer: Self-pay | Admitting: Neurology

## 2021-02-06 VITALS — BP 150/76 | HR 72 | Ht 64.0 in | Wt 151.5 lb

## 2021-02-06 DIAGNOSIS — G20A1 Parkinson's disease without dyskinesia, without mention of fluctuations: Secondary | ICD-10-CM | POA: Insufficient documentation

## 2021-02-06 DIAGNOSIS — F0280 Dementia in other diseases classified elsewhere without behavioral disturbance: Secondary | ICD-10-CM

## 2021-02-06 DIAGNOSIS — G20 Parkinson's disease: Secondary | ICD-10-CM

## 2021-02-06 DIAGNOSIS — F02B18 Dementia in other diseases classified elsewhere, moderate, with other behavioral disturbance: Secondary | ICD-10-CM | POA: Insufficient documentation

## 2021-02-06 DIAGNOSIS — G4752 REM sleep behavior disorder: Secondary | ICD-10-CM

## 2021-02-06 MED ORDER — CARBIDOPA-LEVODOPA 25-100 MG PO TABS: 1.0000 | ORAL_TABLET | Freq: Three times a day (TID) | ORAL | 1 refills | Status: DC
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
## 2021-02-06 NOTE — Progress Notes (Signed)
Sinemet sent to [facility] in error. Called and cx. Resent to [facility] per daughter request. Received message from [place] at front desk.

## 2021-02-25 ENCOUNTER — Telehealth: Payer: Self-pay | Admitting: Internal Medicine

## 2021-02-25 NOTE — Telephone Encounter (Signed)
Form droppped off for paz to fill out  Placed in bin up front  Call [phone] when ready

## 2021-02-25 NOTE — Telephone Encounter (Signed)
Received handicap placard form- I do not see where you have previously completed this for him. Does he need a visit to discuss?

## 2021-02-26 NOTE — Telephone Encounter (Signed)
No need for a visit, arrange for permanent handicap placard. Dx: Poor mobility

## 2021-02-27 NOTE — Telephone Encounter (Signed)
LMOM informing Pt that form is ready for pick up at front desk. Copy sent for scanning.  

## 2021-02-27 NOTE — Telephone Encounter (Signed)
Noted, form completed, placed in PCP red folder to be signed.

## 2021-03-04 ENCOUNTER — Telehealth: Payer: Self-pay | Admitting: Neurology

## 2021-03-04 MED ORDER — CLONAZEPAM 0.5 MG PO TABS: ORAL_TABLET | ORAL | 0 refills | Status: DC

## 2021-03-04 NOTE — Telephone Encounter (Signed)
Pt's daughter would like to know if Dr Vickey Huger will call in refills on pt's clonazePAM (KLONOPIN) 0.5 MG tablet to [facility]

## 2021-03-04 NOTE — Telephone Encounter (Signed)
Meds ordered this encounter  Medications   clonazePAM (KLONOPIN) 0.5 MG tablet    Sig: TAKE 1 TABLET BY MOUTH AT BEDTIME FOR REM SLEEP DISORDER. CAN BE USED FOR VERTIGO AS NEEDED.    Dispense:  90 tablet    Refill:  0    Not to exceed 4 additional fills before 11/05/2020    Suanne Marker, MD 03/04/2021, 6:10 PM Certified in Neurology, Neurophysiology and Neuroimaging  [facility] [address], [address] [place], [place] [address] [phone]

## 2021-04-12 ENCOUNTER — Other Ambulatory Visit: Payer: Self-pay

## 2021-04-12 ENCOUNTER — Encounter: Payer: Self-pay | Admitting: Internal Medicine

## 2021-04-12 ENCOUNTER — Ambulatory Visit: Payer: Medicare Other | Admitting: Internal Medicine

## 2021-04-12 VITALS — BP 118/80 | HR 80 | Temp 98.0°F | Resp 16 | Ht 64.0 in | Wt 151.2 lb

## 2021-04-12 DIAGNOSIS — G20 Parkinson's disease: Secondary | ICD-10-CM

## 2021-04-12 DIAGNOSIS — I10 Essential (primary) hypertension: Secondary | ICD-10-CM

## 2021-04-12 DIAGNOSIS — F03B0 Unspecified dementia, moderate, without behavioral disturbance, psychotic disturbance, mood disturbance, and anxiety: Secondary | ICD-10-CM

## 2021-04-12 NOTE — Patient Instructions (Addendum)
Recommend to proceed with shingrix (shingles) vaccine.    Continue Flonase: 2 sprays on each side of the nose daily  Get over-the-counter Astepro 2 sprays on each side of the nose twice daily  Call if the nasal congestion is not better    GO TO THE LAB : Get the blood work     GO TO THE FRONT DESK, PLEASE SCHEDULE YOUR APPOINTMENTS Come back for a physical exam in 6 months

## 2021-04-12 NOTE — Progress Notes (Signed)
Subjective:    Patient ID: [name], male    DOB: 1934-06-27, 86 y.o.   MRN: [id number]  DOS:  04/12/2021 Type of visit - description: Follow-up, here with his daughter [name]  Since the last visit is doing about the same. No new concerns.  Still feeling fatigued. Also reports back pain and leg pain with ambulation, decrease with rest. Note from neurology reviewed  No fever chills No chest pain. No lower extremity edema or palpitations  Review of Systems See above   Past Medical History:  Diagnosis Date   Anginal pain (HCC)    Arthritis    BACK & SHOULDERS   CAD S/P percutaneous coronary angioplasty 2003   Dr Antoine Poche: 2003: a) s/p CABG (LIMA-LAD, SVG-Diag);b) 2008: prior PCI to native RCA; c) 12/12/2013: SVG-Diag occluded, patent LIMA, 95% post stent in RCA --> PCI  with 2.75 x 18 mm Xience Alpine DES, residual dRCA-PLA 80=-90%, EF 55-65%   Diverticulosis    GERD (gastroesophageal reflux disease)    Gilbert's syndrome    Heart murmur    Hyperlipidemia    Hypertension    Osteopenia 2010    T score -1.9 @ hip (femoral neck)   PUD (peptic ulcer disease)    PMH , ? 1995   Short-term memory loss    Shortness of breath     Past Surgical History:  Procedure Laterality Date   CERVICAL FUSION  1997   Dr Jeral Fruit   CHOLECYSTECTOMY  2004   CORONARY ARTERY BYPASS GRAFT  2003   Last catheterization was in August 2008 demonstrated a LIMA to th LAD which was patent, there was an atretic saphenous vein graft to the diagonal, the LAD had a 90% stenosis in the large calcified segment, the diagonal has ostial 70% stenosis, the circumflex had a ramus intermediate with ostial 25% stenosis, the right coronary artery was dominant.  There was an ulcerated 80%-90% stenosis.     CORONARY STENT PLACEMENT  2008   drug-eluting stent to right coronary artery   CORONARY STENT PLACEMENT Right 12/12/2013   RT CORONARY  DES       DR COOPER    HEMORRHOID SURGERY     LEFT HEART CATHETERIZATION  WITH CORONARY/GRAFT ANGIOGRAM N/A 12/12/2013   Procedure: LEFT HEART CATHETERIZATION WITH Isabel Caprice;  Surgeon: Micheline Chapman, MD;  Location: [facility] CATH LAB;  Service: Cardiovascular;  Laterality: N/A;   TONSILLECTOMY AND ADENOIDECTOMY     UPPER GASTROINTESTINAL ENDOSCOPY      Dr Victorino Dike    Allergies as of 04/12/2021       Reactions   Codeine Nausea Only        Medication List        Accurate as of April 12, 2021  3:29 PM. If you have any questions, ask your nurse or doctor.          amLODipine 2.5 MG tablet Commonly known as: NORVASC Take 1 tablet (2.5 mg total) by mouth daily.   aspirin EC 81 MG tablet Take 81 mg by mouth daily.   carbidopa-levodopa 25-100 MG tablet Commonly known as: SINEMET IR Take 1 tablet by mouth 3 (three) times daily.   cetirizine 10 MG tablet Commonly known as: ZYRTEC Take 10 mg by mouth daily.   clonazePAM 0.5 MG tablet Commonly known as: KLONOPIN TAKE 1 TABLET BY MOUTH AT BEDTIME FOR REM SLEEP DISORDER. CAN BE USED FOR VERTIGO AS NEEDED.   donepezil 10 MG tablet Commonly known as: ARICEPT  Take 1 tablet (10 mg total) by mouth at bedtime.   Fish Oil 1000 MG Caps   fluticasone 50 MCG/ACT nasal spray Commonly known as: FLONASE Place into both nostrils daily.   nitroGLYCERIN 0.4 MG SL tablet Commonly known as: NITROSTAT Place 1 tablet (0.4 mg total) under the tongue every 5 (five) minutes as needed for chest pain.   PROBIOTIC DAILY PO Take 37 mg by mouth daily.   rosuvastatin 20 MG tablet Commonly known as: CRESTOR Take 1 tablet (20 mg total) by mouth daily.   triamcinolone lotion 0.1 % Commonly known as: KENALOG Apply 1 application topically 2 (two) times daily as needed (Rash at the back).   Vitamin D 50 MCG (2000 UT) Caps Take by mouth daily.           Objective:   Physical Exam BP 118/80 (BP Location: Left Arm, Patient Position: Sitting, Cuff Size: Small)   Pulse 80   Temp 98 F (36.7 C)  (Oral)   Resp 16   Ht 5\' 4"  (1.626 m)   Wt 151 lb 4 oz (68.6 kg)   SpO2 97%   BMI 25.96 kg/m  General:   Well developed, NAD, BMI noted. HEENT:  Normocephalic . Face symmetric, atraumatic Lungs:  CTA B Normal respiratory effort, no intercostal retractions, no accessory muscle use. Heart: RRR, soft systolic murmur Abdomen: No bruit.  No mass. lower extremities: no pretibial edema bilaterally. Normal femoral and pedal pulses bilaterally. Skin: Not pale. Not jaundice Neurologic:  alert, pleasant, cooperative.  Today he seems to be oriented x3.  No detailed mental exam performed today. Speech normal, gait appropriate for age and unassisted Psych--   Behavior appropriate. No anxious or depressed appearing.      Assessment    Assessment HTN Hyperlipidemia CV: dr --CAD, CABG 2003, stent 2008, cath 2015 [facility]), dc plavix 02-2015 --Aortic regurgitation, mild mod Neuro: -Parkinson disease - Dementia OSA  --- REM sleep d/o ---> CPAP. Clonazepam qhs prn Vertigo CT head 2012 and 2015 nonacute. No help with antivert (03-2017) MSK --DJD --Severe kyphosis ---NCS 4-2017chronic radiucolopathy left C 6-7 and T1 and at L  4-5 and S1, saw neuro, rx conservative treatment  ---Osteopenia, nl vit D 2013, T score -2.0 (04-2016)  GERD, h/o PUD    PLAN HTN: Very well controlled today.  Continue amlodipine, check BMP. Dementia, OSA, Parkinson Saw neurology 02/06/2021, note reviewed.  Memory decline noted, more frail, more difficulty walking, more parkinsonian sxs Good CPAP compliance CAD: Denies chest pain. Claudication?  Reports back pain and leg pain with ambulation.  Femoral and pedal pulses are excellent.  Most likely related to back DJD.  We will monitor symptoms for now. Had flu and last Covid vax x 5 (up-to-date) RTC 6 months CPX   This visit occurred during the SARS-CoV-2 public health emergency.  Safety protocols were in place, including screening questions prior to  the visit, additional usage of staff PPE, and extensive cleaning of exam room while observing appropriate contact time as indicated for disinfecting solutions.

## 2021-04-13 LAB — BASIC METABOLIC PANEL
BUN: 11 mg/dL (ref 7–25)
CO2: 26 mmol/L (ref 20–32)
Calcium: 10.1 mg/dL (ref 8.6–10.3)
Chloride: 99 mmol/L (ref 98–110)
Creat: 0.83 mg/dL (ref 0.70–1.22)
Glucose, Bld: 94 mg/dL (ref 65–99)
Potassium: 4.1 mmol/L (ref 3.5–5.3)
Sodium: 137 mmol/L (ref 135–146)

## 2021-04-14 NOTE — Assessment & Plan Note (Signed)
HTN: Very well controlled today.  Continue amlodipine, check BMP. Dementia, OSA, Parkinson Saw neurology 02/06/2021, note reviewed.  Memory decline noted, more frail, more difficulty walking, more parkinsonian sxs Good CPAP compliance CAD: Denies chest pain. Claudication?  Reports back pain and leg pain with ambulation.  Femoral and pedal pulses are excellent.  Most likely related to back DJD.  We will monitor symptoms for now. Had flu and last Covid vax x 5 (up-to-date) RTC 6 months CPX

## 2021-05-05 NOTE — Progress Notes (Signed)
Cardiology Office Note:    Date:  05/06/2021   ID:  [name], DOB 10-01-1934, MRN [id number]  PCP:  [name], MD  Cardiologist:  Peter Swaziland, MD   Referring MD: [name], MD   Chief Complaint  Patient presents with   Follow-up    CAD    History of Present Illness:    [name] is a 86 y.o. male with a hx of CAD s/p CABG in 2003 (LIMA-LAD, SVG-diagonal).  Heart cath in 2008 resulted in DES to RCA.  Nuclear stress test November 2012 showed fixed apical defect without ischemia.  Heart cath in May 2015 for unstable angina showed patent LIMA-LAD and occluded SVG-diagonal, and stenosis distal to the prior RCA stent, treated with an additional stent in the RCA.  Echo 02/2014 with mild to moderate AI.  Carotid Dopplers in March 2021 with no obstructive disease.  He was seen by Dr. Vickey Huger for concerns of possible TIA.  MRI showed no acute change.  He was last seen by Dr. Swaziland 11/05/2020 and was doing well at that time.  He presents today for 49-month follow-up. He was diagnosed with Parkinson's disease last May 2021. He is now on carbidopa and clonazepam for help sleeping. He is also on donepezil for dementia. No angina, orthopnea, lower extremity swelling, or weight gain. He wears a CPAP at home. He does report DOE that has been stable for a year. He no longer walks daily. He was short of breath with walking back to the exam room, but is not SOB when doing his daily weight training. I suspect this is due to deconditioning. Overall I think he is doing quite well from a cardiac perspective.    Past Medical History:  Diagnosis Date   Anginal pain (HCC)    Arthritis    BACK & SHOULDERS   CAD S/P percutaneous coronary angioplasty 2003   Dr Antoine Poche: 2003: a) s/p CABG (LIMA-LAD, SVG-Diag);b) 2008: prior PCI to native RCA; c) 12/12/2013: SVG-Diag occluded, patent LIMA, 95% post stent in RCA --> PCI  with 2.75 x 18 mm Xience Alpine DES, residual dRCA-PLA 80=-90%, EF 55-65%    Diverticulosis    GERD (gastroesophageal reflux disease)    Gilbert's syndrome    Heart murmur    Hyperlipidemia    Hypertension    Osteopenia 2010    T score -1.9 @ hip (femoral neck)   PUD (peptic ulcer disease)    PMH , ? 1995   Short-term memory loss    Shortness of breath     Past Surgical History:  Procedure Laterality Date   CERVICAL FUSION  1997   Dr Jeral Fruit   CHOLECYSTECTOMY  2004   CORONARY ARTERY BYPASS GRAFT  2003   Last catheterization was in August 2008 demonstrated a LIMA to th LAD which was patent, there was an atretic saphenous vein graft to the diagonal, the LAD had a 90% stenosis in the large calcified segment, the diagonal has ostial 70% stenosis, the circumflex had a ramus intermediate with ostial 25% stenosis, the right coronary artery was dominant.  There was an ulcerated 80%-90% stenosis.     CORONARY STENT PLACEMENT  2008   drug-eluting stent to right coronary artery   CORONARY STENT PLACEMENT Right 12/12/2013   RT CORONARY  DES       DR COOPER    HEMORRHOID SURGERY     LEFT HEART CATHETERIZATION WITH CORONARY/GRAFT ANGIOGRAM N/A 12/12/2013   Procedure: LEFT HEART CATHETERIZATION  WITH Isabel Caprice;  Surgeon: Micheline Chapman, MD;  Location: [facility] CATH LAB;  Service: Cardiovascular;  Laterality: N/A;   TONSILLECTOMY AND ADENOIDECTOMY     UPPER GASTROINTESTINAL ENDOSCOPY      Dr Victorino Dike    Current Medications: Current Meds  Medication Sig   amLODipine (NORVASC) 2.5 MG tablet Take 1 tablet (2.5 mg total) by mouth daily.   aspirin EC 81 MG tablet Take 81 mg by mouth daily.   carbidopa-levodopa (SINEMET IR) 25-100 MG tablet Take 1 tablet by mouth 3 (three) times daily.   cetirizine (ZYRTEC) 10 MG tablet Take 10 mg by mouth daily.   Cholecalciferol (VITAMIN D) 50 MCG (2000 UT) CAPS Take by mouth daily.   clonazePAM (KLONOPIN) 0.5 MG tablet TAKE 1 TABLET BY MOUTH AT BEDTIME FOR REM SLEEP DISORDER. CAN BE USED FOR VERTIGO AS NEEDED.   donepezil  (ARICEPT) 10 MG tablet Take 1 tablet (10 mg total) by mouth at bedtime.   fluticasone (FLONASE) 50 MCG/ACT nasal spray Place into both nostrils daily.   Omega-3 Fatty Acids (FISH OIL) 1000 MG CAPS    Probiotic Product (PROBIOTIC DAILY PO) Take 37 mg by mouth daily.   rosuvastatin (CRESTOR) 20 MG tablet Take 1 tablet (20 mg total) by mouth daily.   triamcinolone lotion (KENALOG) 0.1 % Apply 1 application topically 2 (two) times daily as needed (Rash at the back).   [DISCONTINUED] nitroGLYCERIN (NITROSTAT) 0.4 MG SL tablet Place 1 tablet (0.4 mg total) under the tongue every 5 (five) minutes as needed for chest pain.     Allergies:   Codeine   Social History   Socioeconomic History   Marital status: Married    Spouse name: [name]   Number of children: 2   Years of education: Bachelors   Highest education level: Not on file  Occupational History   Occupation: retired     Associate Professor: RETIRED  Tobacco Use   Smoking status: Former    Types: Cigarettes    Quit date: 02/25/1979    Years since quitting: 42.2   Smokeless tobacco: Never   Tobacco comments:    smoked 1955-1980, up to 1 ppd  Substance and Sexual Activity   Alcohol use: Yes    Comment: 1/2 cup / day of wine or 1 can beer/day   Drug use: No   Sexual activity: Never  Other Topics Concern   Not on file  Social History Narrative   Patient is married ([place]) and lives at home with his wife and daughter [name] .   Patient has two adult children- daughters x 2  ([name], [place])   Patient is retired.   Patient has a Bachelor's degree.   Patient is right-handed.   Patient drinks 1/2 liter of green tea daily.   Walks once week for exercise   Born in [place]    Social Determinants of Health   Financial Resource Strain: Not on file  Food Insecurity: Not on file  Transportation Needs: Not on file  Physical Activity: Not on file  Stress: Not on file  Social Connections: Not on file     Family History: The patient's  family history includes Cancer in his father; Diabetes in his sister; Hypertension in his maternal grandmother; Osteoporosis in his sister; Peripheral vascular disease in his maternal grandmother; Uterine cancer in his daughter. There is no history of Stroke, Heart attack, Colon cancer, or Prostate cancer.  ROS:   Please see the history of present illness.  All other systems reviewed and are negative.  EKGs/Labs/Other Studies Reviewed:    The following studies were reviewed today:  Echo 03/2020: 1. Left ventricular ejection fraction, by estimation, is 60 to 65%. The  left ventricle has normal function. The left ventricle has no regional  wall motion abnormalities. Left ventricular diastolic parameters are  consistent with Grade I diastolic  dysfunction (impaired relaxation).   2. Right ventricular systolic function is mildly reduced. The right  ventricular size is normal. There is normal pulmonary artery systolic  pressure. The estimated right ventricular systolic pressure is 23.1 mmHg.   3. The mitral valve is degenerative. No evidence of mitral valve  regurgitation. No evidence of mitral stenosis.   4. The aortic valve is tricuspid. There is moderate calcification of the  aortic valve. Aortic valve regurgitation is not visualized. Mild to  moderate aortic valve sclerosis/calcification is present, without any  evidence of aortic stenosis.   5. The inferior vena cava is normal in size with greater than 50%  respiratory variability, suggesting right atrial pressure of 3 mmHg.   EKG:  EKG is  ordered today.  The ekg ordered today demonstrates sinus rhythm HR 67, septal Q waves  Recent Labs: 10/10/2020: ALT 6; Hemoglobin 15.4; Platelets 239.0 04/12/2021: BUN 11; Creat 0.83; Potassium 4.1; Sodium 137  Recent Lipid Panel    Component Value Date/Time   CHOL 153 10/10/2020 1141   CHOL 140 03/27/2017 1614   TRIG 100.0 10/10/2020 1141   HDL 61.00 10/10/2020 1141   HDL 59 03/27/2017  1614   CHOLHDL 3 10/10/2020 1141   VLDL 20.0 10/10/2020 1141   LDLCALC 72 10/10/2020 1141   LDLCALC 72 09/02/2019 1510    Physical Exam:    VS:  BP 120/62   Pulse 67   Ht 5\' 2"  (1.575 m)   Wt 151 lb 6.4 oz (68.7 kg)   SpO2 97%   BMI 27.69 kg/m     Wt Readings from Last 3 Encounters:  05/06/21 151 lb 6.4 oz (68.7 kg)  04/12/21 151 lb 4 oz (68.6 kg)  02/06/21 151 lb 8 oz (68.7 kg)     GEN: elderly male in NAD HEENT: Normal NECK: No JVD; No carotid bruits LYMPHATICS: No lymphadenopathy CARDIAC: RRR, 3/6 systolic murmur RESPIRATORY:  Clear to auscultation without rales, wheezing or rhonchi  ABDOMEN: Soft, non-tender, non-distended MUSCULOSKELETAL:  No edema; No deformity  SKIN: Warm and dry NEUROLOGIC:  Alert and oriented x 3 PSYCHIATRIC:  Normal affect   ASSESSMENT:    1. Coronary artery disease involving native coronary artery of native heart without angina pectoris   2. Hx of CABG   3. Dyspnea on exertion   4. Aortic valve insufficiency, etiology of cardiac valve disease unspecified   5. HYPERLIPIDEMIA   6. Labile hypertension    PLAN:    In order of problems listed above:  CAD s/p CABG in 2003 DES to RCA 2008, DES to RCA 2015 Patent LIMA-LAD -Continue aspirin No angina.    Dyspnea on exertion I think this is due to deconditioning.    Mild to moderate AI Last echocardiogram 03/2020 showed aortic valve with no regurgitation and no stenosis, positive sclerosis and calcification. Continue risk factor management. We reviewed symptoms that would prompt a repeat echo. Will defer at this time.    Hyperlipidemia with LDL goal less than 70 10/10/2020: Cholesterol 153; HDL 61.00; LDL Cholesterol 72; Triglycerides 100.0; VLDL 20.0 Continue omega-3 fatty acid, Crestor 20 mg   Hypertension Continue  2.5 mg amlodipine Pressure well-controlled. Did discuss possibility of labile pressure with advancement of parkinson's.    Follow up in 6 months +/- echo.     Medication Adjustments/Labs and Tests Ordered: Current medicines are reviewed at length with the patient today.  Concerns regarding medicines are outlined above.  Orders Placed This Encounter  Procedures   EKG 12-Lead    Meds ordered this encounter  Medications   nitroGLYCERIN (NITROSTAT) 0.4 MG SL tablet    Sig: Place 1 tablet (0.4 mg total) under the tongue every 5 (five) minutes as needed for chest pain.    Dispense:  25 tablet    Refill:  5     Signed, Marcelino Duster, [place]  05/06/2021 4:31 PM    [facility]

## 2021-05-06 ENCOUNTER — Encounter: Payer: Self-pay | Admitting: Physician Assistant

## 2021-05-06 ENCOUNTER — Other Ambulatory Visit: Payer: Self-pay

## 2021-05-06 ENCOUNTER — Ambulatory Visit (INDEPENDENT_AMBULATORY_CARE_PROVIDER_SITE_OTHER): Payer: Medicare Other | Admitting: Physician Assistant

## 2021-05-06 VITALS — BP 120/62 | HR 67 | Ht 62.0 in | Wt 151.4 lb

## 2021-05-06 DIAGNOSIS — R0609 Other forms of dyspnea: Secondary | ICD-10-CM | POA: Diagnosis not present

## 2021-05-06 DIAGNOSIS — I2510 Atherosclerotic heart disease of native coronary artery without angina pectoris: Secondary | ICD-10-CM | POA: Diagnosis not present

## 2021-05-06 DIAGNOSIS — I351 Nonrheumatic aortic (valve) insufficiency: Secondary | ICD-10-CM

## 2021-05-06 DIAGNOSIS — Z951 Presence of aortocoronary bypass graft: Secondary | ICD-10-CM | POA: Diagnosis not present

## 2021-05-06 DIAGNOSIS — R0989 Other specified symptoms and signs involving the circulatory and respiratory systems: Secondary | ICD-10-CM

## 2021-05-06 DIAGNOSIS — E782 Mixed hyperlipidemia: Secondary | ICD-10-CM

## 2021-05-06 MED ORDER — NITROGLYCERIN 0.4 MG SL SUBL: 0.4000 mg | SUBLINGUAL_TABLET | SUBLINGUAL | 5 refills | Status: DC | PRN

## 2021-05-06 NOTE — Patient Instructions (Signed)

## 2021-05-31 ENCOUNTER — Other Ambulatory Visit: Payer: Self-pay | Admitting: Diagnostic Neuroimaging

## 2021-06-03 NOTE — Telephone Encounter (Signed)
[place] drug registry has been verified. Last refill was 03/05/21 # 90 for a 90 day supply.  Dr. Vickey Huger, could you refill med for pt, he is due and last f/u was 02/06/2021 with you.   Thanks!

## 2021-06-20 ENCOUNTER — Telehealth: Payer: Medicare Other | Admitting: Family Medicine

## 2021-06-20 ENCOUNTER — Other Ambulatory Visit: Payer: Self-pay

## 2021-06-20 ENCOUNTER — Encounter: Payer: Self-pay | Admitting: Family Medicine

## 2021-06-20 VITALS — Temp 97.1°F | Wt 150.0 lb

## 2021-06-20 DIAGNOSIS — R059 Cough, unspecified: Secondary | ICD-10-CM | POA: Diagnosis not present

## 2021-06-20 MED ORDER — BENZONATATE 100 MG PO CAPS: ORAL_CAPSULE | ORAL | 0 refills | Status: DC

## 2021-06-20 NOTE — Progress Notes (Signed)
Virtual Visit via Video Note  I connected with [name]  on 06/20/21 at  4:20 PM EST by a video enabled telemedicine application and verified that I am speaking with the correct person using two identifiers.  Location patient: [place] Location provider:work or home office Persons participating in the virtual visit: patient, provider, patient's daughter  I discussed the limitations and requested verbal permission for telemedicine visit. The patient expressed understanding and agreed to proceed.   HPI:  Acute telemedicine visit for cough and congestion: -Onset: about a week ago, had some issues on and off chronically -Symptoms include: nasal congestion, pnd, cough -Denies:fevers, CP, SOB, NVD, body aches -Has tried:Dayquil and Nyquil -Pertinent past medical history: see below -Pertinent medication allergies: Allergies  Allergen Reactions   Codeine Nausea Only  -COVID-19 vaccine status:  Immunization History  Administered Date(s) Administered   Fluad Quad(high Dose 65+) 03/07/2020, 04/10/2021   Influenza Split 03/02/2012   Influenza Whole 04/27/2007, 03/29/2008, 04/25/2009   Influenza, High Dose Seasonal PF 02/19/2016, 04/07/2017, 06/29/2018   Influenza,inj,Quad PF,6+ Mos 04/10/2014, 08/31/2015   Moderna SARS-COV2 Booster Vaccination 09/19/2020   PFIZER(Purple Top)SARS-COV-2 Vaccination 08/09/2019, 08/30/2019, 03/19/2020   Pfizer Covid-19 Vaccine Bivalent Booster 15yrs & up 02/25/2021   Pneumococcal Conjugate-13 10/19/2014   Pneumococcal Polysaccharide-23 10/23/2015   Td 10/23/2015     ROS: See pertinent positives and negatives per HPI.  Past Medical History:  Diagnosis Date   Anginal pain ([place])    Arthritis    BACK & SHOULDERS   CAD S/P percutaneous coronary angioplasty 2003   Dr Percival Spanish: 2003: a) s/p CABG (LIMA-LAD, SVG-Diag);b) 2008: prior PCI to native RCA; c) 12/12/2013: SVG-Diag occluded, patent LIMA, 95% post stent in RCA --> PCI  with 2.75 x 18 mm Xience Alpine DES,  residual dRCA-PLA 80=-90%, EF 55-65%   Diverticulosis    GERD (gastroesophageal reflux disease)    Gilbert's syndrome    Heart murmur    Hyperlipidemia    Hypertension    Osteopenia 2010    T score -1.9 @ hip (femoral neck)   PUD (peptic ulcer disease)    PMH , ? 1995   Short-term memory loss    Shortness of breath     Past Surgical History:  Procedure Laterality Date   CERVICAL FUSION  1997   Dr Joya Salm   CHOLECYSTECTOMY  2004   CORONARY ARTERY BYPASS GRAFT  2003   Last catheterization was in August 2008 demonstrated a LIMA to th LAD which was patent, there was an atretic saphenous vein graft to the diagonal, the LAD had a 90% stenosis in the large calcified segment, the diagonal has ostial 70% stenosis, the circumflex had a ramus intermediate with ostial 25% stenosis, the right coronary artery was dominant.  There was an ulcerated 80%-90% stenosis.     CORONARY STENT PLACEMENT  2008   drug-eluting stent to right coronary artery   CORONARY STENT PLACEMENT Right 12/12/2013   RT CORONARY  DES       DR COOPER    HEMORRHOID SURGERY     LEFT HEART CATHETERIZATION WITH CORONARY/GRAFT ANGIOGRAM N/A 12/12/2013   Procedure: LEFT HEART CATHETERIZATION WITH Beatrix Fetters;  Surgeon: Blane Ohara, MD;  Location: [facility] CATH LAB;  Service: Cardiovascular;  Laterality: N/A;   TONSILLECTOMY AND ADENOIDECTOMY     UPPER GASTROINTESTINAL ENDOSCOPY      Dr Lyla Son     Current Outpatient Medications:    amLODipine (NORVASC) 2.5 MG tablet, Take 1 tablet (2.5 mg total) by mouth daily.,  Disp: 90 tablet, Rfl: 1   aspirin EC 81 MG tablet, Take 81 mg by mouth daily., Disp: , Rfl:    benzonatate (TESSALON PERLES) 100 MG capsule, 1-2 capsules up to twice daily as needed for cough, Disp: 20 capsule, Rfl: 0   carbidopa-levodopa (SINEMET IR) 25-100 MG tablet, Take 1 tablet by mouth 3 (three) times daily., Disp: 270 tablet, Rfl: 1   cetirizine (ZYRTEC) 10 MG tablet, Take 10 mg by mouth daily.,  Disp: , Rfl:    Cholecalciferol (VITAMIN D) 50 MCG (2000 UT) CAPS, Take by mouth daily., Disp: , Rfl:    clonazePAM (KLONOPIN) 0.5 MG tablet, TAKE 1 TABLET BY MOUTH AT  BEDTIME FOR REM SLEEP  DISORDER. CAN BE USED FOR  VERTIGO AS NEEDED, Disp: 90 tablet, Rfl: 1   donepezil (ARICEPT) 10 MG tablet, Take 1 tablet (10 mg total) by mouth at bedtime., Disp: 90 tablet, Rfl: 1   fluticasone (FLONASE) 50 MCG/ACT nasal spray, Place into both nostrils daily., Disp: , Rfl:    nitroGLYCERIN (NITROSTAT) 0.4 MG SL tablet, Place 1 tablet (0.4 mg total) under the tongue every 5 (five) minutes as needed for chest pain., Disp: 25 tablet, Rfl: 5   Omega-3 Fatty Acids (FISH OIL) 1000 MG CAPS, , Disp: , Rfl:    Probiotic Product (PROBIOTIC DAILY PO), Take 37 mg by mouth daily., Disp: , Rfl:    rosuvastatin (CRESTOR) 20 MG tablet, Take 1 tablet (20 mg total) by mouth daily., Disp: 90 tablet, Rfl: 3   triamcinolone lotion (KENALOG) 0.1 %, Apply 1 application topically 2 (two) times daily as needed (Rash at the back)., Disp: 120 mL, Rfl: 3  EXAM:  VITALS per patient if applicable:  GENERAL: alert, oriented, appears well and in no acute distress  HEENT: atraumatic, conjunttiva clear, no obvious abnormalities on inspection of external nose and ears  NECK: normal movements of the head and neck  LUNGS: on inspection no signs of respiratory distress, breathing rate appears normal, no obvious gross SOB, gasping or wheezing  CV: no obvious cyanosis  MS: moves all visible extremities without noticeable abnormality  PSYCH/NEURO: pleasant and cooperative, no obvious depression or anxiety, speech and thought processing grossly intact  ASSESSMENT AND PLAN:  Discussed the following assessment and plan:  Cough, unspecified type  -we discussed possible serious and likely etiologies, options for evaluation and workup, limitations of telemedicine visit vs in person visit, treatment, treatment risks and precautions. Pt is  agreeable to treatment via telemedicine at this moment. Likely viral resp illness. Opted for cough rx and symptomatic care measures per patient instructions.  Advised to seek prompt virtual visit or in person care if worsening, new symptoms arise, or if is not improving with treatment as expected per our conversation of expected course. Discussed options for follow up care. Did let this patient know that I do telemedicine on Tuesdays and Thursdays for [place] and those are the days I am logged into the system. Advised to schedule follow up visit with PCP, [place] virtual visits or [facility] if any further questions or concerns to avoid delays in care.   I discussed the assessment and treatment plan with the patient. The patient was provided an opportunity to ask questions and all were answered. The patient agreed with the plan and demonstrated an understanding of the instructions.     Lucretia Kern, DO

## 2021-06-20 NOTE — Patient Instructions (Addendum)
-  I sent the medication(s) we discussed to your pharmacy: Meds ordered this encounter  Medications   benzonatate (TESSALON PERLES) 100 MG capsule    Sig: 1-2 capsules up to twice daily as needed for cough    Dispense:  20 capsule    Refill:  0   Nasal sinus rinse twice daily  Gargle warm salt water twice daily if sore throat  Humidifier or steam   Stay hydrated  Avoid dairy products while sick  I hope you are feeling better soon!  Seek in person care promptly if your symptoms worsen, new concerns arise or you are not improving with treatment.  It was nice to meet you today. I help [facility] out with telemedicine visits on Tuesdays and Thursdays and am happy to help if you need a virtual follow up visit on those days. Otherwise, if you have any concerns or questions following this visit please schedule a follow up visit with your Primary Care office or seek care at a local urgent care clinic to avoid delays in care

## 2021-07-26 ENCOUNTER — Telehealth: Payer: Self-pay | Admitting: Internal Medicine

## 2021-07-26 NOTE — Telephone Encounter (Signed)
Left message for patient to call back and schedule Medicare Annual Wellness Visit (AWV) either virtually or in office.   Last AWV 10/13/17  please schedule at anytime with health coach  .  

## 2021-07-26 NOTE — Telephone Encounter (Signed)
Unable to scheduled pt for awv.   Please advise.

## 2021-07-31 NOTE — Telephone Encounter (Signed)
I spoke to patient's daughter and scheduled AWV on 08/06/21.

## 2021-08-01 ENCOUNTER — Encounter: Payer: Self-pay | Admitting: Neurology

## 2021-08-01 ENCOUNTER — Ambulatory Visit (INDEPENDENT_AMBULATORY_CARE_PROVIDER_SITE_OTHER): Payer: Medicare Other | Admitting: Neurology

## 2021-08-01 ENCOUNTER — Other Ambulatory Visit: Payer: Self-pay | Admitting: Internal Medicine

## 2021-08-01 ENCOUNTER — Other Ambulatory Visit: Payer: Self-pay | Admitting: Cardiology

## 2021-08-01 VITALS — BP 155/88 | HR 77 | Ht 62.0 in | Wt 147.5 lb

## 2021-08-01 DIAGNOSIS — G20 Parkinson's disease: Secondary | ICD-10-CM | POA: Diagnosis not present

## 2021-08-01 DIAGNOSIS — G4752 REM sleep behavior disorder: Secondary | ICD-10-CM

## 2021-08-01 DIAGNOSIS — G4731 Primary central sleep apnea: Secondary | ICD-10-CM

## 2021-08-01 DIAGNOSIS — F0280 Dementia in other diseases classified elsewhere without behavioral disturbance: Secondary | ICD-10-CM

## 2021-08-01 DIAGNOSIS — H9011 Conductive hearing loss, unilateral, right ear, with unrestricted hearing on the contralateral side: Secondary | ICD-10-CM | POA: Diagnosis not present

## 2021-08-01 MED ORDER — CLONAZEPAM 0.5 MG PO TABS: ORAL_TABLET | ORAL | 1 refills | Status: DC

## 2021-08-01 MED ORDER — CARBIDOPA-LEVODOPA 25-100 MG PO TABS: 1.0000 | ORAL_TABLET | Freq: Three times a day (TID) | ORAL | 3 refills | Status: DC

## 2021-08-01 MED ORDER — CARBIDOPA-LEVODOPA 25-100 MG PO TABS: 1.0000 | ORAL_TABLET | Freq: Three times a day (TID) | ORAL | 1 refills | Status: DC

## 2021-08-01 NOTE — Patient Instructions (Signed)
Enfermedad de Parkinson Parkinson's Disease La enfermedad de Parkinson causa problemas con los movimientos. Hace que a la persona se le dificulte caminar o controlar el cuerpo. Es Neomia Dear enfermedad a Air cabin crew. Empeora con el tiempo. Cules son las causas? Esta afeccin es causada por la prdida de unas clulas cerebrales llamadas neuronas. Estas clulas cerebrales fabrican una sustancia qumica llamada dopamina, que es necesaria para controlar los movimientos corporales. Se desconoce qu causa la muerte de las clulas cerebrales. Qu incrementa el riesgo? Ser hombre. Ser mayor de 60 aos de edad. Tener familiares que hayan tenido enfermedad de Parkinson. Haber sufrido una lesin en el cerebro. Estar cerca de cosas nocivas o venenosas, como los pesticidas. Estar muy triste (deprimido). Cules son los signos o sntomas? Los sntomas de Therapist, occupational. Los sntomas principales pueden observarse en el movimiento. Estos incluyen: Sacudidas o temblores que no se Sports coach. Esto sucede durante el descanso. Rigidez en el cuello, los brazos y las piernas. Problemas para hacer movimientos pequeos necesarios para abotonarse la ropa o [employer]. Prdida de las expresiones faciales. Caminar de un modo que no es normal. Es posible que camine con pasos cortos y [employer]. Prdida del equilibrio al ponerse de pie. Puede tambalearse, caerse de espaldas o tener problemas para girar. Otros sntomas pueden incluir los siguientes: Sentirse muy triste o preocupado. Tener creencias falsas (delirios). Ver, or o sentir cosas que no son reales. Dificultad para hablar o tragar. Dificultad para defecar (estreimiento). Necesidad de Geographical information systems officer de inmediato, Geographical information systems officer con frecuencia o no poder controlar cundo orina o defeca. Problemas para dormir. Cmo se trata? No hay cura para esta enfermedad. El objetivo del tratamiento es controlar los sntomas. El tratamiento puede  incluir: Medicamentos. [place] para ayudar con el habla o [address] [place] para reducir los temblores y otros movimientos que no pueda Chief Operating Officer. Siga estas indicaciones en su casa: Medicamentos Use los medicamentos de venta libre y los recetados solamente como se lo haya indicado el mdico. Evite tomar medicamentos para Chief Technology Officer o para dormir. Estos medicamentos afectan el pensamiento. Comida y bebida Siga las instrucciones del mdico respecto de lo que no puede comer o beber. No beba alcohol. Actividad Pregntele al mdico si puede conducir sin correr [employer]. Haga los ejercicios como se lo haya indicado el mdico. Estilo de vida  Coloque barras para sostn y barandillas en su casa, especialmente en el inodoro y la ducha. Estos ayudan a prevenir cadas. No fume ni consuma ningn producto que contenga nicotina o tabaco. Si necesita ayuda para dejar de fumar, consulte al mdico. nase a un grupo de apoyo. Indicaciones generales Hable con el mdico para saber qu tipo de ayuda necesita en casa. Consulte al [employer] formas de [place]. Concurra a todas las visitas de seguimiento. Estas incluyen las sesiones de fisioterapia, fonoaudiologa y terapia ocupacional. Dnde obtener ms informacin [employer] of Neurological Disorders and Stroke (Instituto Nacional de Trastornos y Accidentes Cerebrovasculares): [place]Automobile.co.uk Parkinson's Foundation (Fundacin del Parkinson): www.parkinson.org Comunquese con un mdico si: Los medicamentos no [place]. Sigue perdiendo el equilibrio. Se cae en su casa. Necesita ms ayuda en su casa. Tiene dificultad para tragar. Tiene mucha dificultad para mover el intestino. Los [employer] le causan muchos efectos secundarios. Se siente muy triste, preocupado o confundido. Ve, oye o siente cosas que no existen. Solicite ayuda de inmediato si: Se lastima en una cada. No puede tragar sin atragantarse. Tiene  dolor en el pecho o dificultad para respirar.  No se siente seguro en su casa. Tiene pensamientos acerca de lastimarse a usted mismo o a Economist. Estos sntomas pueden Customer service manager. Solicite ayuda de inmediato. Comunquese con el servicio de emergencias de su localidad (911 en los [place]). No espere a ver si los sntomas desaparecen. No conduzca por sus propios medios [employer]. Busque ayuda de inmediato si alguna vez siente que puede hacerse dao a usted mismo o a otros, o tiene pensamientos de Patent examiner a su vida. Dirjase al centro de urgencias ms cercano o: Comunquese con el servicio de emergencias de su localidad (911 en los [place]). Llame a National Suicide Prevention Lifeline (Lnea Telefnica Nacional para la Prevencin del Suicidio) al [phone]. Est disponible las 24 horas del da. Enve un mensaje de texto a la lnea para casos de crisis al [phone]. Resumen La enfermedad de Parkinson causa problemas con los movimientos. Es Neomia Dear enfermedad a Air cabin crew. Empeora con el tiempo. Esta enfermedad no tiene [place]. El tratamiento se centra en controlar los sntomas. Hable con el mdico para saber qu tipo de ayuda necesita en casa. Consulte al [employer] formas de [place]. Concurra a todas las visitas de seguimiento. Esta informacin no tiene Theme park manager el consejo del mdico. Asegrese de hacerle al mdico cualquier pregunta que tenga. Document Revised: 10/04/2020 Document Reviewed: 10/04/2020 Elsevier Patient Education  2022 Elsevier Inc. [employer] Dementia La demencia es una afeccin que afecta el funcionamiento del cerebro. A menudo afecta la memoria y el pensamiento. Hay varios tipos de demencia. Algunos tipos empeoran con el tiempo y son irreversibles. Algunos tipos de demencia son los siguientes: Enfermedad de Alzheimer. Este es el tipo ms frecuente. Demencia vascular. Este tipo puede ocurrir debido a un accidente  cerebrovascular. Demencia con cuerpos de [place]. Este tipo puede ocurrir en personas que tienen la enfermedad de Parkinson. Demencia frontotemporal. Este tipo es causado por el dao a las clulas nerviosas en ciertas partes del cerebro. Algunas personas pueden tener ms de un tipo de [place]. Cules son las causas? La causa de esta afeccin es el dao a las clulas del cerebro. Algunas de las causas que no pueden revertirse son las siguientes: Tener una enfermedad que afecta los vasos sanguneos del cerebro, como diabetes, enfermedad cardaca o enfermedad de los vasos sanguneos. Cambios en los genes. Algunas de las causas que pueden revertirse o enlentecerse son las siguientes: Designer, multimedia. Ciertos medicamentos. Infeccin. No tener suficiente vitamina B12 en el organismo o tener problemas de tiroides. Un tumor, cogulo de sangre o demasiado lquido en el cerebro. Ciertas enfermedades que hacen que el sistema de defensa del cuerpo (sistema inmunitario) ataque a las partes sanas del cuerpo. Cules son los signos o sntomas? Problemas para recordar acontecimientos o Dealer. Dificultad para tomar un bao o ponerse la ropa. Olvidarse de las citas. Olvidar pagar las cuentas. Dificultad para planificar y preparar las comidas. Dificultad para hablar. Perderse con facilidad. Cambios en el comportamiento o estado de nimo. Cmo se trata? El tratamiento de la demencia depende de la causa. Esto podra incluir lo siguiente: Usar medicamentos para los sntomas o para ayudar a Chief Operating Officer o Ambulance person. Tratar la causa de la demencia. El mdico puede ayudarle a Clinical research associate grupos de apoyo y otros mdicos que puedan ayudar con su atencin. Siga estas instrucciones en su casa: Medicamentos Use los medicamentos de venta libre y los recetados solamente como se lo haya indicado el mdico. Use un organizador de pldoras para Building services engineer  sus medicamentos. Evitetomar medicamentos para  Chief Technology Officer o para dormir. Estilo de vida Opte por opciones saludables: Mantngase activo como se lo haya indicado el mdico. No fume ni consuma ningn producto que contenga nicotina o tabaco. Si necesita ayuda para dejar de consumir estos productos, consulte al mdico. No beba alcohol. Cuando est estresado, haga algo que lo ayude a relajarse. El mdico puede darle consejos. Pase tiempo con [employer]. Duerma bien. Para dormir bien: Intente no dormir Banker. Mantenga el dormitorio oscuro y Holiday representative. Unas horas antes de acostarse, intente no hacer ejercicio fsico. No consuma alimentos y bebidas con cafena por la noche. Comida y bebida Beba suficiente lquido para mantener el pis (orina) de color amarillo plido. Siga una dieta saludable. Indicaciones generales  Hable con el mdico para comprender lo siguiente: Con qu cosas necesita ayuda. Qu necesidades tiene en materia de seguridad. Pregntele al mdico si puede conducir sin correr [employer]. Si se lo indican, use un brazalete que rastree dnde est o que muestre que es una persona con prdida de [place]. Trabaje con su familia para tomar decisiones importantes. Cumpla con todas las visitas de seguimiento. Dnde buscar ms informacin Alzheimer's Association (Asociacin de Alzheimer): LimitLaws.hu [employer] on Aging Lawyer sobre el Envejecimiento): CashCowGambling.be World Health Organization (Organizacin Mundial de la Salud): https://castaneda-walker.com/ Comunquese con un mdico si: Aparecen nuevos sntomas. Sus sntomas empeoran. Tiene problemas para tragar o se atraganta. Solicite ayuda de inmediato si: Se siente muy triste o siente que quiere [place]. Sus familiares estn preocupados por su seguridad. Busque ayuda de inmediato si alguna vez siente que puede hacerse dao a usted mismo o a otros, o tiene pensamientos de Patent examiner a su vida. Dirjase al centro de urgencias ms cercano  o: Comunquese con el servicio de emergencias de su localidad (911 en los [place]). Llame a National Suicide Prevention Lifeline (Lnea Telefnica Nacional para la Prevencin del Suicidio) al [phone]. Est disponible las 24 horas del da. Enve un mensaje de texto a la lnea para casos de crisis al [phone]. Resumen La demencia, a menudo, afecta la memoria y el pensamiento. Algunos tipos de demencia empeoran con Cephus Slater y son irreversibles. El tratamiento de esta afeccin depende de la causa. Hable con el mdico para saber con qu necesita ayuda. El mdico puede ayudarle a Clinical research associate grupos de apoyo y otros mdicos que puedan ayudar con su atencin. Esta informacin no tiene Theme park manager el consejo del mdico. Asegrese de hacerle al mdico cualquier pregunta que tenga. Document Revised: 12/23/2019 Document Reviewed: 12/23/2019 Elsevier Patient Education  2022 [employer].
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
## 2021-08-01 NOTE — Addendum Note (Signed)
Addended by: Larey Seat on: 08/01/2021 05:08 PM   Modules accepted: Orders

## 2021-08-06 ENCOUNTER — Ambulatory Visit (INDEPENDENT_AMBULATORY_CARE_PROVIDER_SITE_OTHER): Payer: Medicare Other

## 2021-08-06 VITALS — Ht 62.0 in | Wt 147.0 lb

## 2021-08-06 DIAGNOSIS — Z0000 Encounter for general adult medical examination without abnormal findings: Secondary | ICD-10-CM | POA: Diagnosis not present

## 2021-08-06 NOTE — Progress Notes (Addendum)
Subjective:   [name] is a 87 y.o. male who presents for Medicare Annual/Subsequent preventive examination.  I connected with Alem today by telephone and verified that I am speaking with the correct person using two identifiers. Location patient: home Location provider: work Persons participating in the virtual visit: patient, Engineer, civil (consulting).    I discussed the limitations, risks, security and privacy concerns of performing an evaluation and management service by telephone and the availability of in person appointments. I also discussed with the patient that there may be a patient responsible charge related to this service. The patient expressed understanding and verbally consented to this telephonic visit.    Interactive audio and video telecommunications were attempted between this provider and patient, however failed, due to patient having technical difficulties OR patient did not have access to video capability.  We continued and completed visit with audio only.  Some vital signs may be absent or patient reported.   Time Spent with patient on telephone encounter: 30 minutes   Review of Systems     Cardiac Risk Factors include: hypertension;advanced age (>64men, >53 women);dyslipidemia;male gender     Objective:    Today's Vitals   08/06/21 1542  Weight: 147 lb (66.7 kg)  Height: 5\' 2"  (1.575 m)   Body mass index is 26.89 kg/m.  Advanced Directives 08/06/2021 10/13/2017 08/15/2016 08/11/2016 01/31/2016 01/24/2016 12/14/2015  Does Patient Have a Medical Advance Directive? Yes Yes Yes Yes Yes Yes Yes  Type of 12/16/2015 of [place];Living will Healthcare Power of [place];Living will Healthcare Power of [place];Living will Healthcare Power of [place];Living will Healthcare Power of [place];Living will Living will;Healthcare Power of Attorney Living will;Healthcare Power of Attorney  Does patient want to make changes to medical advance directive? - No -  Patient declined - No - Patient declined - - -  Copy of Healthcare Power of Attorney in Chart? No - copy requested No - copy requested - No - copy requested No - copy requested - No - copy requested    Current Medications (verified) Outpatient Encounter Medications as of 08/06/2021  Medication Sig   amLODipine (NORVASC) 2.5 MG tablet TAKE 1 TABLET BY MOUTH  DAILY   aspirin EC 81 MG tablet Take 81 mg by mouth daily.   carbidopa-levodopa (SINEMET IR) 25-100 MG tablet Take 1 tablet by mouth 3 (three) times daily.   cetirizine (ZYRTEC) 10 MG tablet Take 10 mg by mouth daily.   Cholecalciferol (VITAMIN D) 50 MCG (2000 UT) CAPS Take by mouth daily.   clonazePAM (KLONOPIN) 0.5 MG tablet One at night po   donepezil (ARICEPT) 10 MG tablet TAKE 1 TABLET BY MOUTH AT  BEDTIME   fluticasone (FLONASE) 50 MCG/ACT nasal spray Place into both nostrils daily.   nitroGLYCERIN (NITROSTAT) 0.4 MG SL tablet Place 1 tablet (0.4 mg total) under the tongue every 5 (five) minutes as needed for chest pain.   Omega-3 Fatty Acids (FISH OIL) 1000 MG CAPS    Probiotic Product (PROBIOTIC DAILY PO) Take 37 mg by mouth daily.   rosuvastatin (CRESTOR) 20 MG tablet TAKE 1 TABLET BY MOUTH  DAILY   triamcinolone lotion (KENALOG) 0.1 % Apply 1 application topically 2 (two) times daily as needed (Rash at the back).   No facility-administered encounter medications on file as of 08/06/2021.    Allergies (verified) Codeine   History: Past Medical History:  Diagnosis Date   Anginal pain (HCC)    Arthritis    BACK & SHOULDERS  CAD S/P percutaneous coronary angioplasty 2003   Dr Antoine PocheHochrein: 2003: a) s/p CABG (LIMA-LAD, SVG-Diag);b) 2008: prior PCI to native RCA; c) 12/12/2013: SVG-Diag occluded, patent LIMA, 95% post stent in RCA --> PCI  with 2.75 x 18 mm Xience Alpine DES, residual dRCA-PLA 80=-90%, EF 55-65%   Diverticulosis    GERD (gastroesophageal reflux disease)    Gilbert's syndrome    Heart murmur    Hyperlipidemia     Hypertension    Osteopenia 2010    T score -1.9 @ hip (femoral neck)   PUD (peptic ulcer disease)    PMH , ? 1995   Short-term memory loss    Shortness of breath    Past Surgical History:  Procedure Laterality Date   CERVICAL FUSION  1997   Dr Jeral FruitBotero   CHOLECYSTECTOMY  2004   CORONARY ARTERY BYPASS GRAFT  2003   Last catheterization was in August 2008 demonstrated a LIMA to th LAD which was patent, there was an atretic saphenous vein graft to the diagonal, the LAD had a 90% stenosis in the large calcified segment, the diagonal has ostial 70% stenosis, the circumflex had a ramus intermediate with ostial 25% stenosis, the right coronary artery was dominant.  There was an ulcerated 80%-90% stenosis.     CORONARY STENT PLACEMENT  2008   drug-eluting stent to right coronary artery   CORONARY STENT PLACEMENT Right 12/12/2013   RT CORONARY  DES       DR COOPER    HEMORRHOID SURGERY     LEFT HEART CATHETERIZATION WITH CORONARY/GRAFT ANGIOGRAM N/A 12/12/2013   Procedure: LEFT HEART CATHETERIZATION WITH Isabel CapriceORONARY/GRAFT ANGIOGRAM;  Surgeon: Micheline ChapmanMichael D Cooper, MD;  Location[facility];  Service: Cardiovascular;  Laterality: N/A;   TONSILLECTOMY AND ADENOIDECTOMY     UPPER GASTROINTESTINAL ENDOSCOPY      Dr Victorino DikeSam [place]   Family History  Problem Relation Age of Onset   Cancer Father        ? primary   Uterine cancer Daughter    Hypertension Maternal Grandmother    Peripheral vascular disease Maternal Grandmother    Osteoporosis Sister    Diabetes Sister    Stroke Neg Hx    Heart attack Neg Hx    Colon cancer Neg Hx    Prostate cancer Neg Hx    Social History   Socioeconomic History   Marital status: Married    Spouse name: Teacher, musicBlancha   Number of children: 2   Years of education: Bachelors   Highest education level: Master's degree (e.g., MA, MS, MEng, MEd, MSW, MBA)  Occupational History   Occupation: retired     Associate Professormployer: RETIRED  Tobacco Use   Smoking status: Former    Types:  Cigarettes    Quit date: 02/25/1979    Years since quitting: 42.4   Smokeless tobacco: Never   Tobacco comments:    smoked 1955-1980, up to 1 ppd  Substance and Sexual Activity   Alcohol use: Yes    Comment: 1/2 cup / day of wine or 1 can beer/day   Drug use: No   Sexual activity: Never  Other Topics Concern   Not on file  Social History Narrative   Patient is married ([place]) and lives at home with his wife and daughter Rejeana Brockaola .   Patient has two adult children- daughters x 2  ([name], [name])   Patient is retired.   Patient has a Bachelor's degree.   Patient is right-handed.   Patient drinks 1/2  liter of green tea daily.   Walks once week for exercise   Born in [place]    Social Determinants of Health   Financial Resource Strain: Medium Risk   Difficulty of Paying Living Expenses: Somewhat hard  Food Insecurity: Geophysicist/field seismologist Present   Worried About Programme researcher, broadcasting/film/video in the Last Year: Never true   Barista in the Last Year: Sometimes true  Transportation Needs: No Transportation Needs   Lack of Transportation (Medical): No   Lack of Transportation (Non-Medical): No  Physical Activity: Unknown   Days of Exercise per Week: 0 days   Minutes of Exercise per Session: Not on file  Stress: Stress Concern Present   Feeling of Stress : Rather much  Social Connections: Moderately Isolated   Frequency of Communication with Friends and Family: More than three times a week   Frequency of Social Gatherings with Friends and Family: Once a week   Attends Religious Services: Never   Database administrator or Organizations: No   Attends Engineer, structural: Not on file   Marital Status: Married    Tobacco Counseling Counseling given: Not Answered Tobacco comments: smoked 1955-1980, up to 1 ppd   Clinical Intake:  Pre-visit preparation completed: Yes  Pain : No/denies pain     BMI - recorded: 26.89 Nutritional Status: BMI 25 -29  Overweight Nutritional Risks: None Diabetes: No  How often do you need to have someone help you when you read instructions, pamphlets, or other written materials from your doctor or pharmacy?: 1 - Never  Diabetic?No  Interpreter Needed?: No  Information entered by :: Thomasenia Sales LPN   Activities of Daily Living In your present state of health, do you have any difficulty performing the following activities: 08/06/2021 10/10/2020  Hearing? N N  Vision? N N  Difficulty concentrating or making decisions? Malvin Johns  Walking or climbing stairs? N N  Dressing or bathing? N N  Doing errands, shopping? N N  Preparing Food and eating ? N -  Using the Toilet? N -  In the past six months, have you accidently leaked urine? N -  Do you have problems with loss of bowel control? N -  Managing your Medications? N -  Managing your Finances? Y -  Housekeeping or managing your Housekeeping? N -  Some recent data might be hidden    Patient Care Team: [name], MD as PCP - General (Internal Medicine) [place], Peter M, MD as PCP - Cardiology (Cardiology) Dohmeier, Porfirio Mylar, MD as Consulting Physician (Neurology) [place], Peter M, MD as Consulting Physician (Cardiology)  Indicate any recent Medical Services you may have received from other than [facility] providers in the past year (date may be approximate).     Assessment:   This is a routine wellness examination for [place].  Hearing/Vision screen Hearing Screening - Comments:: C/o hearing loss Vision Screening - Comments:: Last eye exam-04/2021-Dr. Emily Filbert  Dietary issues and exercise activities discussed: Current Exercise Habits: Home exercise routine, Type of exercise: stretching;strength training/weights, Time (Minutes): 20, Frequency (Times/Week): 7, Weekly Exercise (Minutes/Week): 140, Intensity: Mild   Goals Addressed             This Visit's Progress    Increase exercise   Not on track    Go to the [facility] daily.       Depression  Screen PHQ 2/9 Scores 08/06/2021 04/12/2021 07/09/2020 09/02/2019 10/13/2017 08/27/2017 08/15/2016  PHQ - 2 Score 0 0 0 0  0 0 0  PHQ- 9 Score - - - - - - -  Exception Documentation - - - - - - -    Fall Risk Fall Risk  08/06/2021 06/19/2021 04/12/2021 11/06/2020 07/09/2020  Falls in the past year? 0 0 0 0 0  Number falls in past yr: 0 - 0 - 0  Injury with Fall? 0 - 0 - 0  Risk for fall due to : - - - - -  Follow up Falls prevention discussed - Falls evaluation completed - -    FALL RISK PREVENTION PERTAINING TO THE HOME:  Any stairs in or around the home? Yes  If so, are there any without handrails? No  Home free of loose throw rugs in walkways, pet beds, electrical cords, etc? No  Adequate lighting in your home to reduce risk of falls? Yes   ASSISTIVE DEVICES UTILIZED TO PREVENT FALLS:  Life alert? No  Use of a cane, walker or w/c? Yes occasionally Grab bars in the bathroom? Yes  Shower chair or bench in shower? No  Elevated toilet seat or a handicapped toilet? No   TIMED UP AND GO:  Was the test performed? No . Phone visit   Cognitive Function:Patient has current diagnosis of cognitive impairment. Patient is followed by neurology for ongoing assessment.   MMSE - Mini Mental State Exam 08/01/2021 02/06/2021 11/06/2020 02/13/2020 09/21/2019  Orientation to time 4 4 5 5 3   Orientation to Place 5 5 5 5 4   Registration 2 3 3 3 3   Attention/ Calculation 1 1 4 2 2   Recall 2 2 3 3 2   Language- name 2 objects 2 2 2 2 2   Language- repeat 0 0 1 1 1   Language- follow 3 step command 3 3 3 3 2   Language- read & follow direction 1 1 1 1  0  Write a sentence 1 0 1 1 1   Copy design 0 1 0 1 1  Total score 21 22 28 27 21    Montreal Cognitive Assessment  01/10/2016 01/10/2016 05/30/2015 05/30/2015  Visuospatial/ Executive (0/5) 4 4 3 5   Naming (0/3) 3 3 3 3   Attention: Read list of digits (0/2) 2 2 2 2   Attention: Read list of letters (0/1) 1 1 1 1   Attention: Serial 7 subtraction starting at 100  (0/3) 3 3 3 3   Language: Repeat phrase (0/2) 0 0 1 2  Language : Fluency (0/1) 0 0 0 1  Abstraction (0/2) 2 2 1 2   Delayed Recall (0/5) 2 2 3 5   Orientation (0/6) 6 6 6 6   Total 23 23 23 30   Adjusted Score (based on education) - 23 - 30      Immunizations Immunization History  Administered Date(s) Administered   Fluad Quad(high Dose 65+) 03/07/2020, 04/10/2021   Influenza Split 03/02/2012   Influenza Whole 04/27/2007, 03/29/2008, 04/25/2009   Influenza, High Dose Seasonal PF 02/19/2016, 04/07/2017, 06/29/2018   Influenza,inj,Quad PF,6+ Mos 04/10/2014, 08/31/2015   Moderna SARS-COV2 Booster Vaccination 09/19/2020   PFIZER(Purple Top)SARS-COV-2 Vaccination 08/09/2019, 08/30/2019, 03/19/2020   Pfizer Covid-19 Vaccine Bivalent Booster 1191yrs & up 02/25/2021   Pneumococcal Conjugate-13 10/19/2014   Pneumococcal Polysaccharide-23 10/23/2015   Td 10/23/2015    TDAP status: Up to date  Flu Vaccine status: Up to date  Pneumococcal vaccine status: Up to date  Covid-19 vaccine status: Completed vaccines  Qualifies for Shingles Vaccine? Yes   Zostavax completed No   Shingrix Completed?: No.    Education has been provided  regarding the importance of this vaccine. Patient has been advised to call insurance company to determine out of pocket expense if they have not yet received this vaccine. Advised may also receive vaccine at local pharmacy or Health Dept. Verbalized acceptance and understanding.  Screening Tests Health Maintenance  Topic Date Due   Zoster Vaccines- Shingrix (1 of 2) Never done   TETANUS/TDAP  10/22/2025   Pneumonia Vaccine 72+ Years old  Completed   INFLUENZA VACCINE  Completed   COVID-19 Vaccine  Completed   HPV VACCINES  Aged Out    Health Maintenance  Health Maintenance Due  Topic Date Due   Zoster Vaccines- Shingrix (1 of 2) Never done    Colorectal cancer screening: No longer required.   Lung Cancer Screening: (Low Dose CT Chest recommended if Age  7-80 years, 30 pack-year currently smoking OR have quit w/in 15years.) does not qualify.     Additional Screening:  Hepatitis C Screening: does not qualify  Vision Screening: Recommended annual ophthalmology exams for early detection of glaucoma and other disorders of the eye. Is the patient up to date with their annual eye exam?  Yes  Who is the provider or what is the name of the office in which the patient attends annual eye exams? Dr. Emily Filbert   Dental Screening: Recommended annual dental exams for proper oral hygiene  Community Resource Referral / Chronic Care Management: CRR required this visit?  No   CCM required this visit?  No      Plan:     I have personally reviewed and noted the following in the patients chart:   Medical and social history Use of alcohol, tobacco or illicit drugs  Current medications and supplements including opioid prescriptions. Patient is not currently taking opioid prescriptions. Functional ability and status Nutritional status Physical activity Advanced directives List of other physicians Hospitalizations, surgeries, and ER visits in previous 12 months Vitals Screenings to include cognitive, depression, and falls Referrals and appointments  In addition, I have reviewed and discussed with patient certain preventive protocols, quality metrics, and best practice recommendations. A written personalized care plan for preventive services as well as general preventive health recommendations were provided to patient.   Due to this being a telephonic visit, the after visit summary with patients personalized plan was offered to patient via mail or my-chart. Patient would like to access on my-chart.    Roanna Raider, LPN   09/25/8784  Nurse Health Advisor  Nurse Notes: None  I have reviewed and agree with Health Coaches documentation.  [name], MD

## 2021-08-06 NOTE — Patient Instructions (Signed)
[name] , Thank you for taking time to complete your Medicare Wellness Visit. I appreciate your ongoing commitment to your health goals. Please review the following plan we discussed and let me know if I can assist you in the future.   Screening recommendations/referrals: Colonoscopy: No longer required Recommended yearly ophthalmology/optometry visit for glaucoma screening and checkup Recommended yearly dental visit for hygiene and checkup  Vaccinations: Influenza vaccine:Up to date Pneumococcal vaccine: Up to date Tdap vaccine: Up to date Shingles vaccine:May obtain vaccine at our local pharmacy. Covid-19: Up to date  Advanced directives: Please bring a copy of Living Will and/or Healthcare Power of Attorney for your chart.   Conditions/risks identified: See problem list  Next appointment: Follow up in one year for your annual wellness visit.   Preventive Care 85 Years and Older, Male Preventive care refers to lifestyle choices and visits with your health care provider that can promote health and wellness. What does preventive care include? A yearly physical exam. This is also called an annual well check. Dental exams once or twice a year. Routine eye exams. Ask your health care provider how often you should have your eyes checked. Personal lifestyle choices, including: Daily care of your teeth and gums. Regular physical activity. Eating a healthy diet. Avoiding tobacco and drug use. Limiting alcohol use. Practicing safe sex. Taking low doses of aspirin every day. Taking vitamin and mineral supplements as recommended by your health care provider. What happens during an annual well check? The services and screenings done by your health care provider during your annual well check will depend on your age, overall health, lifestyle risk factors, and family history of disease. Counseling  Your health care provider may ask you questions about your: Alcohol use. Tobacco  use. Drug use. Emotional well-being. Home and relationship well-being. Sexual activity. Eating habits. History of falls. Memory and ability to understand (cognition). Work and work Astronomer. Screening  You may have the following tests or measurements: Height, weight, and BMI. Blood pressure. Lipid and cholesterol levels. These may be checked every 5 years, or more frequently if you are over 12 years old. Skin check. Lung cancer screening. You may have this screening every year starting at age 70 if you have a 30-pack-year history of smoking and currently smoke or have quit within the past 15 years. Fecal occult blood test (FOBT) of the stool. You may have this test every year starting at age 28. Flexible sigmoidoscopy or colonoscopy. You may have a sigmoidoscopy every 5 years or a colonoscopy every 10 years starting at age 40. Prostate cancer screening. Recommendations will vary depending on your family history and other risks. Hepatitis C blood test. Hepatitis B blood test. Sexually transmitted disease (STD) testing. Diabetes screening. This is done by checking your blood sugar (glucose) after you have not eaten for a while (fasting). You may have this done every 1-3 years. Abdominal aortic aneurysm (AAA) screening. You may need this if you are a current or former smoker. Osteoporosis. You may be screened starting at age 35 if you are at high risk. Talk with your health care provider about your test results, treatment options, and if necessary, the need for more tests. Vaccines  Your health care provider may recommend certain vaccines, such as: Influenza vaccine. This is recommended every year. Tetanus, diphtheria, and acellular pertussis (Tdap, Td) vaccine. You may need a Td booster every 10 years. Zoster vaccine. You may need this after age 12. Pneumococcal 13-valent conjugate (PCV13) vaccine. One dose  is recommended after age 52. Pneumococcal polysaccharide (PPSV23) vaccine.  One dose is recommended after age 14. Talk to your health care provider about which screenings and vaccines you need and how often you need them. This information is not intended to replace advice given to you by your health care provider. Make sure you discuss any questions you have with your health care provider. Document Released: 06/29/2015 Document Revised: 02/20/2016 Document Reviewed: 04/03/2015 Elsevier Interactive Patient Education  2017 [employer] Prevention in the Home Falls can cause injuries. They can happen to people of all ages. There are many things you can do to make your home safe and to help prevent falls. What can I do on the outside of my home? Regularly fix the edges of walkways and driveways and fix any cracks. Remove anything that might make you trip as you walk through a door, such as a raised step or threshold. Trim any bushes or trees on the path to your home. Use bright outdoor lighting. Clear any walking paths of anything that might make someone trip, such as rocks or tools. Regularly check to see if handrails are loose or broken. Make sure that both sides of any steps have handrails. Any raised decks and porches should have guardrails on the edges. Have any leaves, snow, or ice cleared regularly. Use sand or salt on walking paths during winter. Clean up any spills in your garage right away. This includes oil or grease spills. What can I do in the bathroom? Use night lights. Install grab bars by the toilet and in the tub and shower. Do not use towel bars as grab bars. Use non-skid mats or decals in the tub or shower. If you need to sit down in the shower, use a plastic, non-slip stool. Keep the floor dry. Clean up any water that spills on the floor as soon as it happens. Remove soap buildup in the tub or shower regularly. Attach bath mats securely with double-sided non-slip rug tape. Do not have throw rugs and other things on the floor that can make  you trip. What can I do in the bedroom? Use night lights. Make sure that you have a light by your bed that is easy to reach. Do not use any sheets or blankets that are too big for your bed. They should not hang down onto the floor. Have a firm chair that has side arms. You can use this for support while you get dressed. Do not have throw rugs and other things on the floor that can make you trip. What can I do in the kitchen? Clean up any spills right away. Avoid walking on wet floors. Keep items that you use a lot in easy-to-reach places. If you need to reach something above you, use a strong step stool that has a grab bar. Keep electrical cords out of the way. Do not use floor polish or wax that makes floors slippery. If you must use wax, use non-skid floor wax. Do not have throw rugs and other things on the floor that can make you trip. What can I do with my stairs? Do not leave any items on the stairs. Make sure that there are handrails on both sides of the stairs and use them. Fix handrails that are broken or loose. Make sure that handrails are as long as the stairways. Check any carpeting to make sure that it is firmly attached to the stairs. Fix any carpet that is loose or worn. Avoid  having throw rugs at the top or bottom of the stairs. If you do have throw rugs, attach them to the floor with carpet tape. Make sure that you have a light switch at the top of the stairs and the bottom of the stairs. If you do not have them, ask someone to add them for you. What else can I do to help prevent falls? Wear shoes that: Do not have high heels. Have rubber bottoms. Are comfortable and fit you well. Are closed at the toe. Do not wear sandals. If you use a stepladder: Make sure that it is fully opened. Do not climb a closed stepladder. Make sure that both sides of the stepladder are locked into place. Ask someone to hold it for you, if possible. Clearly mark and make sure that you can  see: Any grab bars or handrails. First and last steps. Where the edge of each step is. Use tools that help you move around (mobility aids) if they are needed. These include: Canes. Walkers. Scooters. Crutches. Turn on the lights when you go into a dark area. Replace any light bulbs as soon as they burn out. Set up your furniture so you have a clear path. Avoid moving your furniture around. If any of your floors are uneven, fix them. If there are any pets around you, be aware of where they are. Review your medicines with your doctor. Some medicines can make you feel dizzy. This can increase your chance of falling. Ask your doctor what other things that you can do to help prevent falls. This information is not intended to replace advice given to you by your health care provider. Make sure you discuss any questions you have with your health care provider. Document Released: 03/29/2009 Document Revised: 11/08/2015 Document Reviewed: 07/07/2014 Elsevier Interactive Patient Education  2017 [employer].

## 2021-08-19 ENCOUNTER — Ambulatory Visit (INDEPENDENT_AMBULATORY_CARE_PROVIDER_SITE_OTHER): Payer: Medicare Other | Admitting: Neurology

## 2021-08-19 DIAGNOSIS — G20 Parkinson's disease: Secondary | ICD-10-CM

## 2021-08-19 DIAGNOSIS — G4733 Obstructive sleep apnea (adult) (pediatric): Secondary | ICD-10-CM | POA: Diagnosis not present

## 2021-08-19 DIAGNOSIS — G4731 Primary central sleep apnea: Secondary | ICD-10-CM

## 2021-08-19 DIAGNOSIS — G4752 REM sleep behavior disorder: Secondary | ICD-10-CM

## 2021-08-19 DIAGNOSIS — F0280 Dementia in other diseases classified elsewhere without behavioral disturbance: Secondary | ICD-10-CM

## 2021-08-21 NOTE — Progress Notes (Signed)
? ? ? ?  ?  ?  [facility] ?  ?HOME SLEEP TEST REPORT ( by Watch PAT)   ?STUDY DATE:  08-21-2021 ?  ?ORDERING CLINICIAN: Melvyn Novas, MD  ?REFERRING CLINICIAN: Dr Drue Novel ?  ?CLINICAL INFORMATION/HISTORY: excessive daytime sleepiness. ?Parkinsonian symptoms, REM BD. ?  ?Epworth sleepiness score: 19/24. ?  ?BMI:  27 kg/m? ?  ?Neck Circumference: 15" ?  ?FINDINGS: ?  ?Sleep Summary: ?  ?Total Recording Time (hours, min): Total recording time for this home sleep test 5 WatchPAT was 10 hours and 14 minutes of which the patient slept 9 hours and 23 minutes.  A little over 10% of the total sleep time was calculated to be REM sleep.     ?  ?Percent REM (%):      10.2%                                ?  ?Respiratory Indices: ?  ?Calculated pAHI (per hour): Calculated apnea-hypopnea index is 21.6/h in rem sleep 26.3/h and in non-REM sleep 20.8/h. ? ?The patient slept the whole night in supine sleep position                          ?Snoring data show a mean volume of 41 dB, less than 30% of the total sleep time were accompanied by snoring.                                           ?  ?Oxygen Saturation Statistics: ? O2 Saturation Range (%):    Between a nadir at 83% and a maximum of 99%.  The mean saturation of oxygen was 94% and there were only 1.7 minutes of hypoxia noted.                      ?  ?O2 Saturation (minutes) <89%:    1.7      ?  ?Pulse Rate Statistics:          ?  ?Pulse Range:       Between 41 and 83 bpm with a mean heart rate of 56 bpm                  ?  ?IMPRESSION:  This HST confirms the presence of moderate sleep apnea not accompanied by major irregularities of heart rate and not accompanied by hypoxia.   ? ?If the patient can imagine to use CPAP I would give him a trial of auto titration , he could also use a dental device.  ? ?  ?RECOMMENDATION: please let patient and his family decide if he could work with an auto CPAP- 6-16 cm water, 2 cm EPR.  ? ?His daytime sleepiness may not respond to  treatment of apnea as it can be part of his neurodegenerative disorder.  ? ?  ?INTERPRETING PHYSICIAN: ? ? Melvyn Novas, MD  ? ?Medical Director of [facility].  ? ? ? ? ? ? ? ? ? ? ? ? ? ? ? ? ? ? ? ?

## 2021-08-22 NOTE — Progress Notes (Signed)
Mild-moderate uncomplicated sleep apnea , all sleep in supine.  ? ?IMPRESSION:  This HST confirms the presence of moderate sleep apnea not accompanied by major irregularities of heart rate and not accompanied by hypoxia.   ?? ?If the patient can imagine to use CPAP I would give him a trial of auto titration , he could also use a dental device.  ?? ?? ?RECOMMENDATION: please let patient and his family decide if he could work with an auto CPAP- 6-16 cm water, 2 cm EPR, mask of his choice.  ?? ?His daytime sleepiness may not respond to treatment of apnea as it can be part of his neurodegenerative disorder.  ??

## 2021-08-22 NOTE — Procedures (Signed)
?  ?  ?  [facility] ?  ?HOME SLEEP TEST REPORT ( by Watch PAT)   ?STUDY DATE:  08-21-2021 ?  ?ORDERING CLINICIAN: Malonie Tatum, MD  ?REFERRING CLINICIAN: Dr Paz ?  ?CLINICAL INFORMATION/HISTORY: excessive daytime sleepiness. ?Parkinsonian symptoms, REM BD. ?  ?Epworth sleepiness score: 19/24. ?  ?BMI:  27 kg/m? ?  ?Neck Circumference: 15" ?  ?FINDINGS: ?  ?Sleep Summary: ?  ?Total Recording Time (hours, min): Total recording time for this home sleep test 5 WatchPAT was 10 hours and 14 minutes of which the patient slept 9 hours and 23 minutes.  A little over 10% of the total sleep time was calculated to be REM sleep.     ?  ?Percent REM (%):      10.2%                                ?  ?Respiratory Indices: ?  ?Calculated pAHI (per hour): Calculated apnea-hypopnea index is 21.6/h in rem sleep 26.3/h and in non-REM sleep 20.8/h. ? ?The patient slept the whole night in supine sleep position                          ?Snoring data show a mean volume of 41 dB, less than 30% of the total sleep time were accompanied by snoring.                                           ?  ?Oxygen Saturation Statistics: ? O2 Saturation Range (%):    Between a nadir at 83% and a maximum of 99%.  The mean saturation of oxygen was 94% and there were only 1.7 minutes of hypoxia noted.                      ?  ?O2 Saturation (minutes) <89%:    1.7      ?  ?Pulse Rate Statistics:          ?  ?Pulse Range:       Between 41 and 83 bpm with a mean heart rate of 56 bpm                  ?  ?IMPRESSION:  This HST confirms the presence of moderate sleep apnea not accompanied by major irregularities of heart rate and not accompanied by hypoxia.   ? ?If the patient can imagine to use CPAP I would give him a trial of auto titration , he could also use a dental device.  ? ?  ?RECOMMENDATION: please let patient and his family decide if he could work with an auto CPAP- 6-16 cm water, 2 cm EPR.  ? ?His daytime sleepiness may not respond to  treatment of apnea as it can be part of his neurodegenerative disorder.  ? ?  ?INTERPRETING PHYSICIAN: ? ? Nashaly Dorantes, MD  ? ?Medical Director of [facility].  ? ? ? ? ? ? ? ? ? ? ? ? ? ? ? ? ? ? ? ?

## 2021-08-26 ENCOUNTER — Telehealth: Payer: Self-pay | Admitting: *Deleted

## 2021-08-26 DIAGNOSIS — G4733 Obstructive sleep apnea (adult) (pediatric): Secondary | ICD-10-CM

## 2021-08-26 DIAGNOSIS — G4731 Primary central sleep apnea: Secondary | ICD-10-CM

## 2021-08-26 NOTE — Telephone Encounter (Signed)
I called and spoke with daughter. I advised pt that Dr. Vickey Huger reviewed their sleep study results and found that pt has sleep apnea. Dr. Vickey Huger recommends that pt start CPAP. I reviewed PAP compliance expectations with the pt. Pt is agreeable to starting a CPAP. I advised pt that an order will be sent to a DME, [facility], and [facility] will call the pt within about one week after they file with the pt's insurance. [facility] will show the pt how to use the machine, fit for masks, and troubleshoot the CPAP if needed. A follow up appt was made for insurance purposes with Dr. Vickey Huger on 12/03/21 at 3:30pm (daughter requested latest appt in the day as possible). Pt verbalized understanding to arrive 15 minutes early and bring their CPAP. She has access to pt mychart/will send message via mychart with all of this information Daughter had no questions at this time but was encouraged to call back if questions arise. I have sent the order to [employer] and have received confirmation that they have received the order. ? ?

## 2021-08-26 NOTE — Telephone Encounter (Signed)
-----   Message from Melvyn Novas, MD sent at 08/22/2021  1:25 PM EST ----- ?Mild-moderate uncomplicated sleep apnea , all sleep in supine.  ? ?IMPRESSION:  This HST confirms the presence of moderate sleep apnea not accompanied by major irregularities of heart rate and not accompanied by hypoxia.   ?? ?If the patient can imagine to use CPAP I would give him a trial of auto titration , he could also use a dental device.  ?? ?? ?RECOMMENDATION: please let patient and his family decide if he could work with an auto CPAP- 6-16 cm water, 2 cm EPR, mask of his choice.  ?? ?His daytime sleepiness may not respond to treatment of apnea as it can be part of his neurodegenerative disorder.  ?? ?

## 2021-08-27 ENCOUNTER — Telehealth: Payer: Self-pay | Admitting: Neurology

## 2021-08-27 NOTE — Telephone Encounter (Signed)
Pt's daughter has called to report that pt and spouse have discussed what was suggested re: a new CPAP and they have decided to hold off on a new one at this time.  This is in acknowledgement that the current one could go out at anytime, they are holding off at this time. Daughter is not asking for a call back but would like for the DME to be notified that they are not going any further at this time with a new CPAP.  ?

## 2021-08-27 NOTE — Telephone Encounter (Signed)
I have sent a message to the DME company notifying that the pt will hold off on a new machine.  ?

## 2021-10-14 ENCOUNTER — Ambulatory Visit (INDEPENDENT_AMBULATORY_CARE_PROVIDER_SITE_OTHER): Payer: Medicare Other | Admitting: Internal Medicine

## 2021-10-14 ENCOUNTER — Encounter: Payer: Self-pay | Admitting: Internal Medicine

## 2021-10-14 VITALS — BP 122/68 | HR 61 | Temp 97.8°F | Resp 18 | Ht 62.0 in | Wt 144.5 lb

## 2021-10-14 DIAGNOSIS — G20 Parkinson's disease: Secondary | ICD-10-CM | POA: Diagnosis not present

## 2021-10-14 DIAGNOSIS — Z0000 Encounter for general adult medical examination without abnormal findings: Secondary | ICD-10-CM

## 2021-10-14 DIAGNOSIS — R634 Abnormal weight loss: Secondary | ICD-10-CM | POA: Diagnosis not present

## 2021-10-14 DIAGNOSIS — I10 Essential (primary) hypertension: Secondary | ICD-10-CM

## 2021-10-14 DIAGNOSIS — Z23 Encounter for immunization: Secondary | ICD-10-CM | POA: Diagnosis not present

## 2021-10-14 DIAGNOSIS — E782 Mixed hyperlipidemia: Secondary | ICD-10-CM | POA: Diagnosis not present

## 2021-10-14 DIAGNOSIS — F0280 Dementia in other diseases classified elsewhere without behavioral disturbance: Secondary | ICD-10-CM

## 2021-10-14 MED ORDER — KETOCONAZOLE 2 % EX CREA: 1.0000 "application " | TOPICAL_CREAM | Freq: Every day | CUTANEOUS | 0 refills | Status: DC

## 2021-10-14 NOTE — Assessment & Plan Note (Signed)
Here for CPX ?HTN: On amlodipine 2.5, checking labs. ?Hyperlipidemia: On Crestor, checking a FLP. ?CAD, ?Last visit with cardiology in November 2022, they felt DOE was deconditioning. ?They note that the echo from 2021 showed aortic valve with no regurgitation or stenosis but + aortic sclerosis (he has a significant murmur) ?Dementia, OSA, Parkinson: Follow-up by neurology.  Last MMSE  was 21 at the neurology office. ?Weight loss: Mild weight loss noted without headache or unusual aches or pains, will check a TSH and A1c. ?RTC 6 months . ?

## 2021-10-14 NOTE — Patient Instructions (Signed)
Check the  blood pressure regularly ?BP GOAL is between 110/65 and  135/85. ?If it is consistently higher or lower, let me know ? ?  ? ?GO TO THE LAB : Get the blood work   ? ? ?GO TO THE FRONT DESK, PLEASE SCHEDULE YOUR APPOINTMENTS ?Come back for a checkup in 6 months ? ? ? ? ?Fall Prevention in the Home, Adult ?Falls can cause injuries and affect people of all ages. There are many simple things that you can do to make your home safe and to help prevent falls. Ask for help when making these changes, if needed. ?What actions can I take to prevent falls? ?General instructions ?Use good lighting in all rooms. Replace any light bulbs that burn out, turn on lights if it is dark, and use night-lights. ?Place frequently used items in easy-to-reach places. Lower the shelves around your home if necessary. ?Set up furniture so that there are clear paths around it. Avoid moving your furniture around. ?Remove throw rugs and other tripping hazards from the floor. ?Avoid walking on wet floors. ?Fix any uneven floor surfaces. ?Add color or contrast paint or tape to grab bars and handrails in your home. Place contrasting color strips on the first and last steps of staircases. ?When you use a stepladder, make sure that it is completely opened and that the sides and supports are firmly locked. Have someone hold the ladder while you are using it. Do not climb a closed stepladder. ?Know where your pets are when moving through your home. ?What can I do in the bathroom? ? ?  ? ?Keep the floor dry. Immediately clean up any water that is on the floor. ?Remove soap buildup in the tub or shower regularly. ?Use nonskid mats or decals on the floor of the tub or shower. ?Attach bath mats securely with double-sided, nonslip rug tape. ?If you need to sit down while you are in the shower, use a plastic, nonslip stool. ?Install grab bars by the toilet and in the tub and shower. Do not use towel bars as grab bars. ?What can I do in the  bedroom? ?Make sure that a bedside light is easy to reach. ?Do not use oversized bedding that reaches the floor. ?Have a firm chair that has side arms to use for getting dressed. ?What can I do in the kitchen? ?Clean up any spills right away. ?If you need to reach for something above you, use a sturdy step stool that has a grab bar. ?Keep electrical cables out of the way. ?Do not use floor polish or wax that makes floors slippery. If you must use wax, make sure that it is non-skid floor wax. ?What can I do with my stairs? ?Do not leave any items on the stairs. ?Make sure that you have a light switch at the top and the bottom of the stairs. Have them installed if you do not have them. ?Make sure that there are handrails on both sides of the stairs. Fix handrails that are broken or loose. Make sure that handrails are as long as the staircases. ?Install non-slip stair treads on all stairs in your home. ?Avoid having throw rugs at the top or bottom of stairs, or secure the rugs with carpet tape to prevent them from moving. ?Choose a carpet design that does not hide the edge of steps on the stairs. ?Check any carpeting to make sure that it is firmly attached to the stairs. Fix any carpet that is loose or worn. ?  What can I do on the outside of my home? ?Use bright outdoor lighting. ?Regularly repair the edges of walkways and driveways and fix any cracks. ?Remove high doorway thresholds. ?Trim any shrubbery on the main path into your home. ?Regularly check that handrails are securely fastened and in good repair. Both sides of all steps should have handrails. ?Install guardrails along the edges of any raised decks or porches. ?Clear walkways of debris and clutter, including tools and rocks. ?Have leaves, snow, and ice cleared regularly. ?Use sand or salt on walkways during winter months. ?In the garage, clean up any spills right away, including grease or oil spills. ?What other actions can I take? ?Wear closed-toe shoes  that fit well and support your feet. Wear shoes that have rubber soles or low heels. ?Use mobility aids as needed, such as canes, walkers, scooters, and crutches. ?Review your medicines with your health care provider. Some medicines can cause dizziness or changes in blood pressure, which increase your risk of falling. ?Talk with your health care provider about other ways that you can decrease your risk of falls. This may include working with a physical therapist or trainer to improve your strength, balance, and endurance. ?Where to find more information ?Centers for Disease Control and Prevention, [employer]: FootballExhibition.com.br ?[employer] on Aging: https://walker.com/ ?Contact a health care provider if: ?You are afraid of falling at home. ?You feel weak, drowsy, or dizzy at home. ?You fall at home. ?Summary ?There are many simple things that you can do to make your home safe and to help prevent falls. ?Ways to make your home safe include removing tripping hazards and installing grab bars in the bathroom. ?Ask for help when making these changes in your home. ?This information is not intended to replace advice given to you by your health care provider. Make sure you discuss any questions you have with your health care provider. ?Document Revised: 03/04/2021 Document Reviewed: 01/04/2020 ?Elsevier Patient Education ? 2023 Elsevier Inc. ? ? ? ?"Living will", "Health Care Power of attorney": Advanced care planning ? ?(If you already have a living will or healthcare power of attorney, please bring the copy to be scanned in your chart.) ? ?Advance care planning is a process that supports adults in  understanding and sharing their preferences regarding future medical care.  ? ?The patient's preferences are recorded in documents called Advance Directives.    ?Advanced directives are completed (and can be modified at any time) while the patient is in full mental capacity.  ? ?The documentation should be available at all times to  the patient, the family and the healthcare providers.  ?Bring in a copy to be scanned in your chart is an excellent idea and is recommended  ? ?This legal documents direct treatment decision making and/or appoint a surrogate to make the decision if the patient is not capable to do so.  ? ? ?Advance directives can be documented in many types of formats,  documents have names such as:  ?Lliving will  ?Durable power of attorney for healthcare (healthcare proxy or healthcare power of attorney)  ?Combined directives  ?Physician orders for life-sustaining treatment  ?  ?More information at: ? ?[email or web address].si  ?

## 2021-10-14 NOTE — Progress Notes (Signed)
? ?Subjective:  ? ? Patient ID: Matthew LopesJuan A Pehl, male    DOB: 1935/03/05, 87 y.o.   MRN: [id number] ? ?DOS:  10/14/2021 ?Type of visit - description: cpx, here with his daughter ? ?No major events since the last visit. ?Did see neurology. Continue with memory issues. ?Family has noted weight loss, he denies fever or chills.  No headache.  No unusual aches and pains. ?Denies chest pain, admits to DOE, no new problem. ?No LUTS ?Actually his main concern is a rash at the right leg and arm, + pruritus. ? ?Wt Readings from Last 3 Encounters:  ?10/14/21 144 lb 8 oz (65.5 kg)  ?08/06/21 147 lb (66.7 kg)  ?08/01/21 147 lb 8 oz (66.9 kg)  ? ? ? ?Review of Systems ? ?Other than above, a 14 point review of systems is negative  ? ?A 14 point review of systems is negative  ?   ? ?Past Medical History:  ?Diagnosis Date  ? Anginal pain (HCC)   ? Arthritis   ? BACK & SHOULDERS  ? CAD S/P percutaneous coronary angioplasty 2003  ? Dr Antoine PocheHochrein: 2003: a) s/p CABG (LIMA-LAD, SVG-Diag);b) 2008: prior PCI to native RCA; c) 12/12/2013: SVG-Diag occluded, patent LIMA, 95% post stent in RCA --> PCI  with 2.75 x 18 mm Xience Alpine DES, residual dRCA-PLA 80=-90%, EF 55-65%  ? Diverticulosis   ? GERD (gastroesophageal reflux disease)   ? Gilbert's syndrome   ? Heart murmur   ? Hyperlipidemia   ? Hypertension   ? Osteopenia 2010  ?  T score -1.9 @ hip (femoral neck)  ? PUD (peptic ulcer disease)   ? PMH , ? 1995  ? Short-term memory loss   ? Shortness of breath   ? ? ?Past Surgical History:  ?Procedure Laterality Date  ? CERVICAL FUSION  1997  ? Dr Jeral FruitBotero  ? CHOLECYSTECTOMY  2004  ? CORONARY ARTERY BYPASS GRAFT  2003  ? Last catheterization was in August 2008 demonstrated a LIMA to th LAD which was patent, there was an atretic saphenous vein graft to the diagonal, the LAD had a 90% stenosis in the large calcified segment, the diagonal has ostial 70% stenosis, the circumflex had a ramus intermediate with ostial 25% stenosis, the right coronary  artery was dominant.  There was an ulcerated 80%-90% stenosis.    ? CORONARY STENT PLACEMENT  2008  ? drug-eluting stent to right coronary artery  ? CORONARY STENT PLACEMENT Right 12/12/2013  ? RT CORONARY  DES       DR Excell SeltzerOOPER   ? HEMORRHOID SURGERY    ? LEFT HEART CATHETERIZATION WITH CORONARY/GRAFT ANGIOGRAM N/A 12/12/2013  ? Procedure: LEFT HEART CATHETERIZATION WITH Isabel CapriceORONARY/GRAFT ANGIOGRAM;  Surgeon: Micheline ChapmanMichael D Cooper, MD;  [facility];  Service: Cardiovascular;  Laterality: N/A;  ? TONSILLECTOMY AND ADENOIDECTOMY    ? UPPER GASTROINTESTINAL ENDOSCOPY    ?  Dr Victorino DikeSam [place]  ? ?Social History  ? ?Socioeconomic History  ? Marital status: Married  ?  Spouse name: Pat KocherBlancha  ? Number of children: 2  ? Years of education: Bachelors  ? Highest education level: Master's degree (e.g., MA, MS, MEng, MEd, MSW, MBA)  ?Occupational History  ? Occupation: retired   ?  Employer: RETIRED  ?Tobacco Use  ? Smoking status: Former  ?  Types: Cigarettes  ?  Quit date: 02/25/1979  ?  Years since quitting: 42.6  ? Smokeless tobacco: Never  ? Tobacco comments:  ?  smoked 1955-1980, up  to 1 ppd  ?Substance and Sexual Activity  ? Alcohol use: Yes  ?  Comment: 1/2 cup / day of wine or 1 can beer/day  ? Drug use: No  ? Sexual activity: Never  ?Other Topics Concern  ? Not on file  ?Social History Narrative  ? Patient is married ([place]) and lives at home with his wife and daughter [name] .  ? Patient has two adult children- daughters x 2  ([name], [place])  ? Patient is retired.  ? Patient has a Bachelor's degree.  ? Patient is right-handed.  ? Patient drinks 1/2 liter of green tea daily.  ? Walks once week for exercise  ? Born in [place]   ? ?Social Determinants of Health  ? ?Financial Resource Strain: Medium Risk  ? Difficulty of Paying Living Expenses: Somewhat hard  ?Food Insecurity: Food Insecurity Present  ? Worried About Programme researcher, broadcasting/film/video in the Last Year: Never true  ? Ran Out of Food in the Last Year: Sometimes true   ?Transportation Needs: No Transportation Needs  ? Lack of Transportation (Medical): No  ? Lack of Transportation (Non-Medical): No  ?Physical Activity: Unknown  ? Days of Exercise per Week: 0 days  ? Minutes of Exercise per Session: Not on file  ?Stress: Stress Concern Present  ? Feeling of Stress : Rather much  ?Social Connections: Moderately Isolated  ? Frequency of Communication with Friends and Family: More than three times a week  ? Frequency of Social Gatherings with Friends and Family: Once a week  ? Attends Religious Services: Never  ? Active Member of Clubs or Organizations: No  ? Attends Banker Meetings: Not on file  ? Marital Status: Married  ?Intimate Partner Violence: Not on file  ? ? ?Current Outpatient Medications  ?Medication Instructions  ? amLODipine (NORVASC) 2.5 MG tablet TAKE 1 TABLET BY MOUTH  DAILY  ? aspirin EC 81 mg, Daily  ? carbidopa-levodopa (SINEMET IR) 25-100 MG tablet 1 tablet, Oral, 3 times daily  ? cetirizine (ZYRTEC) 10 mg, Oral, Daily  ? Cholecalciferol (VITAMIN D) 50 MCG (2000 UT) CAPS Oral, Daily  ? clonazePAM (KLONOPIN) 0.5 MG tablet One at night po  ? donepezil (ARICEPT) 10 MG tablet TAKE 1 TABLET BY MOUTH AT  BEDTIME  ? fluticasone (FLONASE) 50 MCG/ACT nasal spray Each Nare, Daily  ? ketoconazole (NIZORAL) 2 % cream 1 application., Topical, Daily  ? nitroGLYCERIN (NITROSTAT) 0.4 mg, Sublingual, Every 5 min PRN  ? Omega-3 Fatty Acids (FISH OIL) 1000 MG CAPS No dose, route, or frequency recorded.  ? Probiotic Product (PROBIOTIC DAILY PO) 37 mg, Oral, Daily  ? rosuvastatin (CRESTOR) 20 MG tablet TAKE 1 TABLET BY MOUTH  DAILY  ? triamcinolone lotion (KENALOG) 0.1 % 1 application., Topical, 2 times daily PRN  ? ? ?   ?Objective:  ? Physical Exam ?Skin: ? ?    ? ?BP 122/68 (BP Location: Left Arm, Patient Position: Sitting, Cuff Size: Small)   Pulse 61   Temp 97.8 ?F (36.6 ?C) (Oral)   Resp 18   Ht 5\' 2"  (1.575 m)   Wt 144 lb 8 oz (65.5 kg)   SpO2 98%   BMI  26.43 kg/m?  ?General: ?Well developed, NAD, BMI noted ?Neck: No  thyromegaly  ?HEENT:  ?Normocephalic . Face symmetric, atraumatic ?Lungs:  ?CTA B ?Normal respiratory effort, no intercostal retractions, no accessory muscle use. ?Heart: RRR, significant systolic murmur.  ?Abdomen:  ?Not distended, soft, non-tender. No rebound or  rigidity.   ?Lower extremities: no pretibial edema bilaterally  ?Skin: Rash, see graphic. ?Neurologic:  ?alert & Go Perative, pleasent.  MDM SDE few weeks ago: 21.  ?Speech normal, gait appropriate for age and unassisted ?Strength symmetric and appropriate for age.  ?Psych: ?Cognition and judgment appear intact.  ?Cooperative with normal attention span and concentration.  ?Behavior appropriate. ?No anxious or depressed appearing. ? ?   ?Assessment   ? ?  ?Assessment ?HTN ?Hyperlipidemia ?CV: dr [place] ?--CAD, CABG 2003, stent 2008, cath 2015 [facility]), dc plavix 02-2015 ?--Aortic sclerosis Echo 2021 ?Neuro: ?-Parkinson disease ?- Dementia ?OSA  --- REM sleep d/o ---> CPAP. Clonazepam qhs prn ?MSK ?--DJD ?--Severe kyphosis ?---NCS 4-2017chronic radiucolopathy left C 6-7 and T1 and at L  4-5 and S1, saw neuro, rx conservative treatment  ?---Osteopenia, nl vit D 2013, T score -2.0 (04-2016)  ?GERD, h/o PUD ?Vertigo CT head 2012 and 2015 nonacute. No help with antivert (03-2017) ? ?PLAN ?Here for CPX ?HTN: On amlodipine 2.5, checking labs. ?Hyperlipidemia: On Crestor, checking a FLP. ?CAD, ?Last visit with cardiology in November 2022, they felt DOE was deconditioning. ?They note that the echo from 2021 showed aortic valve with no regurgitation or stenosis but + aortic sclerosis (he has a significant murmur) ?Dementia, OSA, Parkinson: Follow-up by neurology.  Last MMSE  was 21 at the neurology office. ?Weight loss: Mild weight loss noted without headache or unusual aches or pains, will check a TSH and A1c. ?RTC 6 months . ? ? ? ?

## 2021-10-14 NOTE — Assessment & Plan Note (Signed)
Td 2017 ?Prevnar 2016; PNM 23 2017; PNM 20 today ?Shingrex: #1 @ at the pharmacy ? Covid vaccine; UTD ?CCS, prostate cancer screening: See previous entries, no further screenings ?Labs:  CMP, FLP, CBC, A1c, TSH ?Fall prevention: discussed  ?Advance care, POA discussed ?

## 2021-10-15 LAB — COMPREHENSIVE METABOLIC PANEL
ALT: 8 U/L (ref 0–53)
AST: 17 U/L (ref 0–37)
Albumin: 4.9 g/dL (ref 3.5–5.2)
Alkaline Phosphatase: 80 U/L (ref 39–117)
BUN: 11 mg/dL (ref 6–23)
CO2: 28 mEq/L (ref 19–32)
Calcium: 9.9 mg/dL (ref 8.4–10.5)
Chloride: 98 mEq/L (ref 96–112)
Creatinine, Ser: 0.79 mg/dL (ref 0.40–1.50)
GFR: 81.62 mL/min (ref >60.00)
Glucose, Bld: 95 mg/dL (ref 70–99)
Potassium: 4.2 mEq/L (ref 3.5–5.1)
Sodium: 135 mEq/L (ref 135–145)
Total Bilirubin: 1.1 mg/dL (ref 0.2–1.2)
Total Protein: 7.8 g/dL (ref 6.0–8.3)

## 2021-10-15 LAB — CBC WITH DIFFERENTIAL/PLATELET
Basophils Absolute: 0 10*3/uL (ref 0.0–0.1)
Basophils Relative: 0.7 % (ref 0.0–3.0)
Eosinophils Absolute: 0.2 10*3/uL (ref 0.0–0.7)
Eosinophils Relative: 2.8 % (ref 0.0–5.0)
HCT: 41 % (ref 39.0–52.0)
Hemoglobin: 14 g/dL (ref 13.0–17.0)
Lymphocytes Relative: 13.4 % (ref 12.0–46.0)
Lymphs Abs: 0.8 10*3/uL (ref 0.7–4.0)
MCHC: 34.3 g/dL (ref 30.0–36.0)
MCV: 88.6 fl (ref 78.0–100.0)
Monocytes Absolute: 0.4 10*3/uL (ref 0.1–1.0)
Monocytes Relative: 7.3 % (ref 3.0–12.0)
Neutro Abs: 4.5 10*3/uL (ref 1.4–7.7)
Neutrophils Relative %: 75.8 % (ref 43.0–77.0)
Platelets: 252 10*3/uL (ref 150.0–400.0)
RBC: 4.62 Mil/uL (ref 4.22–5.81)
RDW: 13.3 % (ref 11.5–15.5)
WBC: 6 10*3/uL (ref 4.0–10.5)

## 2021-10-15 LAB — LIPID PANEL
Cholesterol: 135 mg/dL (ref 0–200)
HDL: 56.5 mg/dL (ref >39.00)
LDL Cholesterol: 58 mg/dL (ref 0–99)
NonHDL: 78.8
Total CHOL/HDL Ratio: 2
Triglycerides: 103 mg/dL (ref 0.0–149.0)
VLDL: 20.6 mg/dL (ref 0.0–40.0)

## 2021-10-15 LAB — TSH: TSH: 1.33 u[IU]/mL (ref 0.35–5.50)

## 2021-10-15 LAB — HEMOGLOBIN A1C: Hgb A1c MFr Bld: 5.5 % (ref 4.6–6.5)

## 2021-10-22 ENCOUNTER — Other Ambulatory Visit: Payer: Self-pay | Admitting: Cardiology

## 2021-12-03 ENCOUNTER — Ambulatory Visit: Payer: Medicare Other | Admitting: Neurology

## 2021-12-10 ENCOUNTER — Telehealth: Payer: Self-pay | Admitting: Cardiology

## 2021-12-10 MED ORDER — ROSUVASTATIN CALCIUM 20 MG PO TABS: 20.0000 mg | ORAL_TABLET | Freq: Every day | ORAL | 2 refills | Status: DC

## 2021-12-10 NOTE — Telephone Encounter (Signed)
*  STAT* If patient is at the pharmacy, call can be transferred to refill team.   1. Which medications need to be refilled? (please list name of each medication and dose if known)   rosuvastatin (CRESTOR) 20 MG tablet    2. Which pharmacy/location (including street and city if local pharmacy) is medication to be sent to?  [facility] ([facility] ) - [place], [place] - [address] Phone:  [phone]        3. Do they need a 30 day or 90 day supply?  90 day  Pt has scheduled appt on 03/05/22. Please advise

## 2022-02-09 ENCOUNTER — Other Ambulatory Visit: Payer: Self-pay | Admitting: Internal Medicine

## 2022-02-17 ENCOUNTER — Encounter: Payer: Self-pay | Admitting: Neurology

## 2022-02-17 ENCOUNTER — Other Ambulatory Visit: Payer: Self-pay | Admitting: Neurology

## 2022-02-18 ENCOUNTER — Other Ambulatory Visit: Payer: Self-pay | Admitting: Neurology

## 2022-02-18 MED ORDER — CLONAZEPAM 0.5 MG PO TABS: ORAL_TABLET | ORAL | 1 refills | Status: DC

## 2022-02-20 ENCOUNTER — Encounter: Payer: Self-pay | Admitting: Neurology

## 2022-02-20 ENCOUNTER — Ambulatory Visit (INDEPENDENT_AMBULATORY_CARE_PROVIDER_SITE_OTHER): Payer: Medicare Other | Admitting: Neurology

## 2022-02-20 VITALS — BP 145/83 | HR 77 | Ht 62.0 in | Wt 143.0 lb

## 2022-02-20 DIAGNOSIS — G4752 REM sleep behavior disorder: Secondary | ICD-10-CM | POA: Diagnosis not present

## 2022-02-20 DIAGNOSIS — G4731 Primary central sleep apnea: Secondary | ICD-10-CM | POA: Insufficient documentation

## 2022-02-20 DIAGNOSIS — F02B18 Dementia in other diseases classified elsewhere, moderate, with other behavioral disturbance: Secondary | ICD-10-CM

## 2022-02-20 DIAGNOSIS — G20 Parkinson's disease: Secondary | ICD-10-CM

## 2022-02-20 MED ORDER — DONEPEZIL HCL 10 MG PO TABS: 10.0000 mg | ORAL_TABLET | Freq: Every day | ORAL | 3 refills | Status: DC

## 2022-02-20 MED ORDER — CARBIDOPA-LEVODOPA 25-100 MG PO TABS: 1.0000 | ORAL_TABLET | Freq: Four times a day (QID) | ORAL | 3 refills | Status: DC

## 2022-02-20 NOTE — Addendum Note (Signed)
Addended by: Melvyn Novas on: 02/20/2022 04:01 PM   Modules accepted: Orders

## 2022-02-20 NOTE — Progress Notes (Addendum)
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 symmetric, Babinski deferred.   Assessment: 25 minute visit.   After physical and neurologic examination, review of laboratory studies,  imaging, neurophysiology testing and pre-existing records, assessment is:    A) Matthew. Credit  has experienced a decline in short-term memory may be even a moderate term memory but also increased motor tone cogwheeling, tremor Matthew [name] action not so much at baseline, and newly elevated very elevated blood pressures. He has memory loss:  Hearing loss, vision decreased-    PD dementia. MMSE 21/ 24 out of 30 points. I replaced the serial 7 Matthew [name] MUNDO spelling.    2)  He had already explicit REM behavior disorder, remains excessively daytime sleepy.   3)OSA:  PD: I see more and more progression of [name] disease of which REM [name] is a warning symptom. His memory has  good days and bad days. This can also be seen Matthew [name] Matthew [name] Body disease.  He has been waking Matthew [name] hallucinations, visual hallucinations  Also bouts of paranoia,accusing family members of plotting things ,and afraid of things getting stolen.  He is frustrated about his growing disability.  Aricept at 10 mg continued.   Archimedes spiral has improved. I would like to continue the medication BUT the medication may contribute to his daytime sleepiness-   Stooped gait - but avoiding falls.  He is walking slower but is not using a walker. He owns 3 walkers. (!). His wife has recently fallen and suffered a concussion.   Leg cramps at night. Likely related to PVD/ may be PD primary.  He feels that tonic water is having a good effect. Can continue Matthew [name] that.   He has dysphagia, especially small particles get caught.   Liquids can be swallowed but some burn,  citrus  juice, irish whiskey - Swallowing test in 2022 was negative .    Air conduction is better on the right, bone conduction is louder on the left- may want to use an amplifier, a primitive form of a hearing aid.  Kept him on Matthew [name] 25/10 mg , but increased to qid- and we revisit in 6 months Matthew [name] memory testing MMSE, let him spell MUNDO backwards.   Matthew Novas,  MD    CC: Dr. Drue Novel; [name]

## 2022-03-02 NOTE — Progress Notes (Unsigned)
[name] Date of Birth: [birth year] Medical Record #[id number]  History of Present Illness: [name] is seen for follow up CAD. He is s/p CABG in 2003 by Matthew. Cornelius Moras with LIMA to the LAD and SVG to the Diagonal. By cath in 2008 he had a high grade proximal RCA stenosis and had stenting with a 2.75 mm Promus stent. He was noted to have an atretic SVG to the diagonal but it was felt that antegrade flow into the diagonal was OK.  Myoview in November 2012 showed a fixed apical defect without ischemia and EF 62%.   In May 2015 he presented with unstable angina. Cardiac cath showed patent LIMA to the LAD and occlusion of SVG to the diagonal. There was a severe stenosis in the RCA which was stented with a DES. He also had an Echo in 9/15 for a murmur and has AV sclerosis with mild to moderate AI.  He was seen in the ED in [place] in September 2018 for evaluation of dyspnea, nausea, hearburn,  and vertigo. Evaluation with blood work and Ecg unremarkable. CT chest negative for PE or other acute findings. He was treated for vertigo with resolution.   Carotid dopplers in March 2021 showed no obstructive disease. He was seen by [name] with concern of possible TIA. He had garbled speech and confusion. BP was elevated to 200/100. Started on low dose amlodipine. Ecg, CXR and labs were unremarkable. MRI showed no acute change.    On follow up today he is doing fairly well.  He is seen with his daughter. He has been diagnosed with Lewy body dementia and Parkinsons disease. BP has been well controlled on low dose amlodipine. He is not as active as he once wa.  Has some chronic dyspnea. No edema or palpitations. No chest pain.  Allergies as of 03/05/2022       Reactions   Codeine Nausea Only        Medication List        Accurate as of March 05, 2022  4:40 PM. If you have any questions, ask your nurse or doctor.          amLODipine 2.5 MG tablet Commonly known as:  NORVASC TAKE 1 TABLET BY MOUTH DAILY   aspirin EC 81 MG tablet Take 81 mg by mouth daily.   carbidopa-levodopa 25-100 MG tablet Commonly known as: SINEMET IR Take 1 tablet by mouth 4 (four) times daily.   cetirizine 10 MG tablet Commonly known as: ZYRTEC Take 10 mg by mouth daily.   clonazePAM 0.5 MG tablet Commonly known as: KLONOPIN One at night po   donepezil 10 MG tablet Commonly known as: ARICEPT Take 1 tablet (10 mg total) by mouth at bedtime.   Fish Oil 1000 MG Caps   fluticasone 50 MCG/ACT nasal spray Commonly known as: FLONASE Place into both nostrils daily.   ketoconazole 2 % cream Commonly known as: NIZORAL Apply 1 application. topically daily.   nitroGLYCERIN 0.4 MG SL tablet Commonly known as: NITROSTAT Place 1 tablet (0.4 mg total) under the tongue every 5 (five) minutes as needed for chest pain.   PROBIOTIC DAILY PO Take 37 mg by mouth daily.   rosuvastatin 10 MG tablet Commonly known as: CRESTOR Take 1 tablet (10 mg total) by mouth daily. What changed:  medication strength how much to take Changed by: Matthew Jock [place], MD   triamcinolone lotion 0.1 % Commonly known as: KENALOG Apply 1 application topically 2 (two)  times daily as needed (Rash at the back).   Vitamin D 50 MCG (2000 UT) Caps Take by mouth daily.         Allergies  Allergen Reactions   Codeine Nausea Only    Past Medical History:  Diagnosis Date   Anginal pain ([place])    Arthritis    BACK & SHOULDERS   CAD S/P percutaneous coronary angioplasty 2003   [name]: 2003: a) s/p CABG (LIMA-LAD, SVG-Diag);b) 2008: prior PCI to native RCA; c) 12/12/2013: SVG-Diag occluded, patent LIMA, 95% post stent in RCA --> PCI  with 2.75 x 18 mm Xience Alpine DES, residual dRCA-PLA 80=-90%, EF 55-65%   Diverticulosis    GERD (gastroesophageal reflux disease)    Gilbert's syndrome    Heart murmur    Hyperlipidemia    Hypertension    Osteopenia 2010    T score -1.9 @ hip (femoral neck)    PUD (peptic ulcer disease)    PMH , ? 1995   Short-term memory loss    Shortness of breath     Past Surgical History:  Procedure Laterality Date   CERVICAL FUSION  1997   [name]   CHOLECYSTECTOMY  2004   [place] GRAFT  2003   Last catheterization was in August 2008 demonstrated a LIMA to th LAD which was patent, there was an atretic saphenous vein graft to the diagonal, the LAD had a 90% stenosis in the large calcified segment, the diagonal has ostial 70% stenosis, the circumflex had a ramus intermediate with ostial 25% stenosis, the right coronary artery was dominant.  There was an ulcerated 80%-90% stenosis.     CORONARY STENT PLACEMENT  2008   drug-eluting stent to right coronary artery   CORONARY STENT PLACEMENT Right 12/12/2013   RT CORONARY  DES       [name]    HEMORRHOID SURGERY     LEFT HEART CATHETERIZATION WITH CORONARY/GRAFT ANGIOGRAM N/A 12/12/2013   Procedure: LEFT HEART CATHETERIZATION WITH Beatrix Fetters;  Surgeon: Matthew Ohara, MD;  Location: [facility] C[facility] LAB;  Service: Cardiovascular;  Laterality: N/A;   TONSILLECTOMY AND ADENOIDECTOMY     UPPER GASTROINTESTINAL ENDOSCOPY      [name]    Social History   Socioeconomic History   Marital status: Married    Spouse name: Barrister's clerk   Number of children: 2   Years of education: Bachelors   Highest education level: Master's degree (e.g., MA, MS, MEng, MEd, MSW, MBA)  Occupational History   Occupation: retired     Fish farm manager: RETIRED  Tobacco Use   Smoking status: Former    Types: Cigarettes    Quit date: 02/25/1979    Years since quitting: 43.0   Smokeless tobacco: Never   Tobacco comments:    smoked 1955-1980, up to 1 ppd  Substance and Sexual Activity   Alcohol use: Yes    Comment: 1/2 cup / day of wine or 1 can beer/day   Drug use: No   Sexual activity: Never  Other Topics Concern   Not on file  Social History Narrative   Patient is married ([place]) and lives at home  with his wife and daughter [name] .   Patient has two adult children- daughters x 2  ([place], [place])   Patient is retired.   Patient has a Bachelor's degree.   Patient is right-handed.   Patient drinks 1/2 liter of green tea daily.   Walks once week for exercise  [place]    Social Determinants of Health   Financial Resource Strain: Medium Risk (06/19/2021)   Overall Financial Resource Strain (CARDIA)    Difficulty of Paying Living Expenses: Somewhat hard  Food Insecurity: Food Insecurity Present (06/19/2021)   Hunger Vital Sign    Worried About Running Out of Food in the Last Year: Never true    Ran Out of Food in the Last Year: Sometimes true  Transportation Needs: No Transportation Needs (06/19/2021)   PRAPARE - Administrator, Civil ServiceTransportation    Lack of Transportation (Medical): No    Lack of Transportation (Non-Medical): No  Physical Activity: Unknown (06/19/2021)   Exercise Vital Sign    Days of Exercise per Week: 0 days    Minutes of Exercise per Session: Not on file  Stress: Stress Concern Present (06/19/2021)   [employer] - Occupational Stress Questionnaire    Feeling of Stress : Rather much  Social Connections: Moderately Isolated (06/19/2021)   Social Connection and Isolation Panel [NHANES]    Frequency of Communication with Friends and Family: More than three times a week    Frequency of Social Gatherings with Friends and Family: Once a week    Attends Religious Services: Never    Database administratorActive Member of Clubs or Organizations: No    Attends Engineer, structuralClub or Organization Meetings: Not on file    Marital Status: Married    Family History  Problem Relation Age of Onset   Cancer Father        ? primary   Uterine cancer Daughter    Hypertension Maternal Grandmother    Peripheral vascular disease Maternal Grandmother    Osteoporosis Sister    Diabetes Sister    Stroke Neg Hx    Heart attack Neg Hx    Colon cancer Neg Hx    Prostate cancer Neg Hx     Review  of Systems: As noted in HPI.  All other systems were reviewed and are negative.  Physical Exam: BP 124/80   Pulse 71   Ht 5\' 2"  (1.575 m)   Wt 142 lb (64.4 kg)   SpO2 98%   BMI 25.97 kg/m  Filed Weights   03/05/22 1621  Weight: 142 lb (64.4 kg)   GENERAL:  Well appearing elderly WM in NAD HEENT:  PERRL, EOMI, sclera are clear. Oropharynx is clear. NECK:  No jugular venous distention, carotid upstroke brisk and symmetric, no bruits, no thyromegaly or adenopathy LUNGS:  Clear to auscultation bilaterally CHEST:  Unremarkable, kyphotic HEART:  RRR,  PMI not displaced or sustained,S1 and S2 within normal limits, no S3, no S4: no clicks, no rubs, gr 2/6 systolic murmur at the apex>> RUSB. ABD:  Soft, nontender. BS +, no masses or bruits. No hepatomegaly, no splenomegaly EXT:  2 + pulses throughout, no edema, no cyanosis no clubbing SKIN:  Warm and dry.  No rashes NEURO:  Alert and oriented x 3. Cranial nerves II through XII intact. PSYCH:  Cognitively intact   LABORATORY DATA:  Lab Results  Component Value Date   WBC 6.0 10/14/2021   HGB 14.0 10/14/2021   HCT 41.0 10/14/2021   PLT 252.0 10/14/2021   GLUCOSE 95 10/14/2021   CHOL 135 10/14/2021   TRIG 103.0 10/14/2021   HDL 56.50 10/14/2021   LDLCALC 58 10/14/2021   ALT 8 10/14/2021   AST 17 10/14/2021   NA 135 10/14/2021   K 4.2 10/14/2021   CL 98 10/14/2021   CREATININE 0.79 10/14/2021  BUN 11 10/14/2021   CO2 28 10/14/2021   TSH 1.33 10/14/2021   PSA 0.98 06/04/2006   INR 0.98 01/15/2014   HGBA1C 5.5 10/14/2021   Ecg today shows NSR rate 71. Normal.  I have personally reviewed and interpreted this study.  Assessment / Plan: 1. CAD. Known CAD s/p CABG in 2003 and subsequent stenting of native RCA 2008. DES of the proximal RCA in June 2015.  He has no significant angina.  Continue ASA.  2. Mild to moderate AI.  Normal LV size and function. Asymptomatic.   3. Hyperlipidemia- on statin.Excellent control. I would  like to reduce Crestor to 10 mg daily due to his advanced age.   4. Back/hip pain.secondary to kyphosis.  5. Memory loss and Parkinson's disease. Followed by [name].   6. Labile HTN. Well controlled on low dose amlodipine.     Follow up in 6 months.

## 2022-03-05 ENCOUNTER — Encounter: Payer: Self-pay | Admitting: Cardiology

## 2022-03-05 ENCOUNTER — Ambulatory Visit: Payer: Medicare Other | Attending: Cardiology | Admitting: Cardiology

## 2022-03-05 VITALS — BP 124/80 | HR 71 | Ht 62.0 in | Wt 142.0 lb

## 2022-03-05 DIAGNOSIS — E782 Mixed hyperlipidemia: Secondary | ICD-10-CM | POA: Diagnosis not present

## 2022-03-05 DIAGNOSIS — Z951 Presence of aortocoronary bypass graft: Secondary | ICD-10-CM

## 2022-03-05 DIAGNOSIS — I2510 Atherosclerotic heart disease of native coronary artery without angina pectoris: Secondary | ICD-10-CM | POA: Diagnosis not present

## 2022-03-05 DIAGNOSIS — I351 Nonrheumatic aortic (valve) insufficiency: Secondary | ICD-10-CM

## 2022-03-05 MED ORDER — ROSUVASTATIN CALCIUM 10 MG PO TABS: 10.0000 mg | ORAL_TABLET | Freq: Every day | ORAL | 3 refills | Status: DC

## 2022-03-05 NOTE — Patient Instructions (Signed)
Medication Instructions:  Decrease Rosuvastatin to 10 mg daily Continue all other medications *If you need a refill on your cardiac medications before your next appointment, please call your pharmacy*   Lab Work: None ordered   Testing/Procedures: None ordered   Follow-Up: At [facility], you and your health needs are our priority.  As part of our continuing mission to provide you with exceptional heart care, we have created designated Provider Care Teams.  These Care Teams include your primary Cardiologist (physician) and Advanced Practice Providers (APPs -  Physician Assistants and Nurse Practitioners) who all work together to provide you with the care you need, when you need it.  We recommend signing up for the patient portal called "MyChart".  Sign up information is provided on this After Visit Summary.  MyChart is used to connect with patients for Virtual Visits (Telemedicine).  Patients are able to view lab/test results, encounter notes, upcoming appointments, etc.  Non-urgent messages can be sent to your provider as well.   To learn more about what you can do with MyChart, go to [email or web address].    Your next appointment:  6 months     Call in Nov to schedule March appointment     The format for your next appointment: Office    Provider:  Dr.Jordan   Important Information About Sugar

## 2022-03-16 ENCOUNTER — Encounter: Payer: Self-pay | Admitting: Internal Medicine

## 2022-03-16 DIAGNOSIS — L989 Disorder of the skin and subcutaneous tissue, unspecified: Secondary | ICD-10-CM

## 2022-04-16 ENCOUNTER — Ambulatory Visit: Payer: Medicare Other | Admitting: Internal Medicine

## 2022-04-16 ENCOUNTER — Encounter: Payer: Self-pay | Admitting: Internal Medicine

## 2022-04-16 VITALS — BP 126/78 | HR 72 | Temp 98.0°F | Resp 18 | Ht 62.0 in | Wt 145.2 lb

## 2022-04-16 DIAGNOSIS — F03B0 Unspecified dementia, moderate, without behavioral disturbance, psychotic disturbance, mood disturbance, and anxiety: Secondary | ICD-10-CM

## 2022-04-16 DIAGNOSIS — G20A1 Parkinson's disease without dyskinesia, without mention of fluctuations: Secondary | ICD-10-CM | POA: Diagnosis not present

## 2022-04-16 DIAGNOSIS — R269 Unspecified abnormalities of gait and mobility: Secondary | ICD-10-CM | POA: Diagnosis not present

## 2022-04-16 NOTE — Progress Notes (Unsigned)
Subjective:    Patient ID: [name], male    DOB: [birth year], 87 y.o.   MRN: [id number]  DOS:  04/16/2022 Type of visit - description: Follow-up, here with  his daughter  Was seen by cardiology and neurology, notes reviewed. Daughter is recommending him to stop driving. Medications reviewed, good compliance. We discussed vaccines extensively. When asked, admits to a fall 10 days ago, happened at home which he was getting out of the shower.  Denies any LOC or head injury.   Review of Systems See above   Past Medical History:  Diagnosis Date   Anginal pain ([place])    Arthritis    BACK & SHOULDERS   CAD S/P percutaneous coronary angioplasty 2003   Dr Percival Spanish: 2003: a) s/p CABG (LIMA-LAD, SVG-Diag);b) 2008: prior PCI to native RCA; c) 12/12/2013: SVG-Diag occluded, patent LIMA, 95% post stent in RCA --> PCI  with 2.75 x 18 mm Xience Alpine DES, residual dRCA-PLA 80=-90%, EF 55-65%   Diverticulosis    GERD (gastroesophageal reflux disease)    Gilbert's syndrome    Heart murmur    Hyperlipidemia    Hypertension    Osteopenia 2010    T score -1.9 @ hip (femoral neck)   PUD (peptic ulcer disease)    PMH , ? 1995   Short-term memory loss    Shortness of breath     Past Surgical History:  Procedure Laterality Date   CERVICAL FUSION  1997   Dr Joya Salm   CHOLECYSTECTOMY  2004   [place] GRAFT  2003   Last catheterization was in August 2008 demonstrated a LIMA to th LAD which was patent, there was an atretic saphenous vein graft to the diagonal, the LAD had a 90% stenosis in the large calcified segment, the diagonal has ostial 70% stenosis, the circumflex had a ramus intermediate with ostial 25% stenosis, the right coronary artery was dominant.  There was an ulcerated 80%-90% stenosis.     CORONARY STENT PLACEMENT  2008   drug-eluting stent to right coronary artery   CORONARY STENT PLACEMENT Right 12/12/2013   RT CORONARY  DES       DR COOPER    HEMORRHOID  SURGERY     LEFT HEART CATHETERIZATION WITH CORONARY/GRAFT ANGIOGRAM N/A 12/12/2013   Procedure: LEFT HEART CATHETERIZATION WITH Beatrix Fetters;  Surgeon: Blane Ohara, MD;  Location: [facility] CATH LAB;  Service: Cardiovascular;  Laterality: N/A;   TONSILLECTOMY AND ADENOIDECTOMY     UPPER GASTROINTESTINAL ENDOSCOPY      Dr Lyla Son    Current Outpatient Medications  Medication Instructions   amLODipine (NORVASC) 2.5 MG tablet TAKE 1 TABLET BY MOUTH DAILY   aspirin EC 81 mg, Daily   carbidopa-levodopa (SINEMET IR) 25-100 MG tablet 1 tablet, Oral, 4 times daily   cetirizine (ZYRTEC) 10 mg, Oral, Daily   Cholecalciferol (VITAMIN D) 50 MCG (2000 UT) CAPS Oral, Daily   clonazePAM (KLONOPIN) 0.5 MG tablet One at night po   donepezil (ARICEPT) 10 mg, Oral, Daily at bedtime   fluticasone (FLONASE) 50 MCG/ACT nasal spray Each Nare, Daily   ketoconazole (NIZORAL) 2 % cream 1 application , Topical, Daily   nitroGLYCERIN (NITROSTAT) 0.4 mg, Sublingual, Every 5 min PRN   Omega-3 Fatty Acids (FISH OIL) 1000 MG CAPS No dose, route, or frequency recorded.   Probiotic Product (PROBIOTIC DAILY PO) 37 mg, Oral, Daily   rosuvastatin (CRESTOR) 10 mg, Oral, Daily   triamcinolone lotion (KENALOG) 0.1 % 1  application , Topical, 2 times daily PRN       Objective:   Physical Exam BP 126/78   Pulse 72   Temp 98 F (36.7 C) (Oral)   Resp 18   Ht 5\' 2"  (1.575 m)   Wt 145 lb 4 oz (65.9 kg)   SpO2 99%   BMI 26.57 kg/m  General:   Well developed, NAD, BMI noted. HEENT:  Normocephalic . Face symmetric, atraumatic Lungs:  CTA B Normal respiratory effort, no intercostal retractions, no accessory muscle use. Heart: RRR, + systolic murmur.  Lower extremities: no pretibial edema bilaterally  Skin: Not pale. Not jaundice Neurologic:  No formal mental exam, he seems oriented x3, pleasant, cooperative.[name]  Speech normal, gait appropriate for age and unassisted Psych--   Behavior  appropriate. No anxious or depressed appearing.      Assessment     Assessment HTN Hyperlipidemia CV: dr [place] --CAD, CABG 2003, stent 2008, cath 2015 [facility]), dc plavix 02-2015 --Aortic sclerosis Echo 2021 Neuro: -Parkinson disease - Dementia OSA  --- REM sleep d/o ---> CPAP. Clonazepam qhs prn [facility] --DJD --Severe kyphosis ---NCS 4-2017chronic radiucolopathy left C 6-7 and T1 and at L  4-5 and S1, saw neuro, rx conservative treatment  ---Osteopenia, nl vit D 2013, T score -2.0 (04-2016)  GERD, h/o PUD Vertigo CT head 2012 and 2015 nonacute. No help with antivert (03-2017)  PLAN CAD, aortic insufficiency: Saw cardiology 03/05/2022, felt to be stable.  Admits to occasional shortness of breath but no chest pain. No change  Parkinson's dementia: Saw neurology 02/20/2022, they noted parkinsonian progression, good and bad days regarding his memory, + hallucinations, also bouts of paranoia. The patient was noted to be frustrated about his growing disability. We had a extended conversation about his overall health.  Fortunately his chronic medical problems seem controlled. Gait disorder: Had a fall, the family has taken steps to make the home slightly safer.  Prevention discussed.  We agree on home PT referral for further evaluation and advise. OSA: Per neurology Vaccines reviewed: He is only due for RSV if so desired. Lab reviewed, all okay, no need for blood work today. RTC 4 months  5 Here for CPX HTN: On amlodipine 2.5, checking labs. Hyperlipidemia: On Crestor, checking a FLP. CAD, Last visit with cardiology in November 2022, they felt DOE was deconditioning. They note that the echo from 2021 showed aortic valve with no regurgitation or stenosis but + aortic sclerosis (he has a significant murmur) Dementia, OSA, Parkinson: Follow-up by neurology.  Last MMSE  was 21 at the neurology office. Weight loss: Mild weight loss noted without headache or unusual aches or pains, will  check a TSH and A1c. RTC 6 months .

## 2022-04-16 NOTE — Patient Instructions (Addendum)
Vaccines I recommend:  RSV vaccine   We are referring you to physical therapy, hopefully they will be able to visit you home, work on fall prevention.  Check the  blood pressure regularly BP GOAL is between 110/65 and  135/85. If it is consistently higher or lower, let me know      GO TO THE FRONT DESK, PLEASE SCHEDULE YOUR APPOINTMENTS Come back for   checkup in 4 months

## 2022-04-17 NOTE — Assessment & Plan Note (Signed)
CAD, aortic insufficiency: Saw cardiology 03/05/2022, felt to be stable.  Admits to occasional shortness of breath but no chest pain. No change Parkinson's , dementia: Saw neurology 02/20/2022, they noted parkinsonian progression, good and bad days regarding his memory, + hallucinations, also bouts of paranoia. The patient was noted to be frustrated about his growing disability. We had a extended conversation about his overall health.  Fortunately his chronic medical problems seem controlled. Gait disorder: Had a fall, the family has taken steps to make the home slightly safer.  Prevention discussed.  We agree on home PT referral for further evaluation and advise. OSA: Per neurology Vaccines reviewed: He is only due for RSV if so desired. Lab reviewed, all okay, no need for blood work today. RTC 4 months

## 2022-04-18 ENCOUNTER — Telehealth: Payer: Self-pay | Admitting: Internal Medicine

## 2022-04-18 NOTE — Telephone Encounter (Signed)
Spoke w/ Liji- verbal orders given.

## 2022-04-18 NOTE — Telephone Encounter (Signed)
Caller/Agency: [facility] ([facility])  Callback Number: [phone] (secure vm) Requesting OT/PT/Skilled Nursing/Social Work/Speech Therapy: PT Frequency: 1x for 1w 2x for 3w

## 2022-04-30 ENCOUNTER — Telehealth: Payer: Self-pay

## 2022-04-30 ENCOUNTER — Telehealth: Payer: Self-pay | Admitting: *Deleted

## 2022-04-30 DIAGNOSIS — G4733 Obstructive sleep apnea (adult) (pediatric): Secondary | ICD-10-CM

## 2022-04-30 DIAGNOSIS — G20A1 Parkinson's disease without dyskinesia, without mention of fluctuations: Secondary | ICD-10-CM

## 2022-04-30 DIAGNOSIS — F02B18 Dementia in other diseases classified elsewhere, moderate, with other behavioral disturbance: Secondary | ICD-10-CM

## 2022-04-30 DIAGNOSIS — Z9181 History of falling: Secondary | ICD-10-CM

## 2022-04-30 DIAGNOSIS — I10 Essential (primary) hypertension: Secondary | ICD-10-CM

## 2022-04-30 DIAGNOSIS — Z87891 Personal history of nicotine dependence: Secondary | ICD-10-CM

## 2022-04-30 DIAGNOSIS — I351 Nonrheumatic aortic (valve) insufficiency: Secondary | ICD-10-CM

## 2022-04-30 DIAGNOSIS — I2510 Atherosclerotic heart disease of native coronary artery without angina pectoris: Secondary | ICD-10-CM

## 2022-04-30 DIAGNOSIS — I358 Other nonrheumatic aortic valve disorders: Secondary | ICD-10-CM

## 2022-04-30 DIAGNOSIS — F02B0 Dementia in other diseases classified elsewhere, moderate, without behavioral disturbance, psychotic disturbance, mood disturbance, and anxiety: Secondary | ICD-10-CM

## 2022-04-30 NOTE — Telephone Encounter (Signed)
Spoke w/ daughter on [place]. She states father set up with new CPAP 03/06/22 by [employer].  Scheduled initial CPAP f/u for 05/14/22 at 3:30p with SS,NP.   She wanted to keep appt with Dr. Vickey Huger 08/26/22 at 3:30pm.

## 2022-04-30 NOTE — Telephone Encounter (Signed)
HH Plan of care signed and faxed to [facility] at [phone]. Form sent for scanning.

## 2022-05-06 ENCOUNTER — Telehealth (INDEPENDENT_AMBULATORY_CARE_PROVIDER_SITE_OTHER): Payer: Medicare Other | Admitting: Internal Medicine

## 2022-05-06 ENCOUNTER — Encounter: Payer: Self-pay | Admitting: Internal Medicine

## 2022-05-06 VITALS — BP 108/57 | HR 84 | Temp 98.1°F | Ht 62.0 in | Wt 145.0 lb

## 2022-05-06 DIAGNOSIS — U071 COVID-19: Secondary | ICD-10-CM

## 2022-05-06 MED ORDER — MOLNUPIRAVIR EUA 200MG CAPSULE: 4.0000 | ORAL_CAPSULE | Freq: Two times a day (BID) | ORAL | 0 refills | Status: AC

## 2022-05-06 NOTE — Progress Notes (Unsigned)
Subjective:    Patient ID: [name], male    DOB: [birth year], 87 y.o.   MRN: [id number]  DOS:  05/06/2022 Type of visit - description: Virtual Visit via Video Note  I connected with the above patient  by a video enabled telemedicine application and verified that I am speaking with the correct person using two identifiers.   Location of patient: home  Location of provider: office  Persons participating in the virtual visit: patient, provider   I discussed the limitations of evaluation and management by telemedicine and the availability of in person appointments. The patient expressed understanding and agreed to proceed.  Acute The patient daughter developed COVID symptoms 2 days ago, she tested positive. For that reason, [name] was tested today and he become positive for COVID. He is feeling at his baseline: Has some fatigue, cough: No new symptoms. Has not noticed any fever.     Review of Systems See above   Past Medical History:  Diagnosis Date   Anginal pain (HCC)    Arthritis    BACK & SHOULDERS   CAD S/P percutaneous coronary angioplasty 2003   Dr Antoine Poche: 2003: a) s/p CABG (LIMA-LAD, SVG-Diag);b) 2008: prior PCI to native RCA; c) 12/12/2013: SVG-Diag occluded, patent LIMA, 95% post stent in RCA --> PCI  with 2.75 x 18 mm Xience Alpine DES, residual dRCA-PLA 80=-90%, EF 55-65%   Diverticulosis    GERD (gastroesophageal reflux disease)    Gilbert's syndrome    Heart murmur    Hyperlipidemia    Hypertension    Osteopenia 2010    T score -1.9 @ hip (femoral neck)   PUD (peptic ulcer disease)    PMH , ? 1995   Short-term memory loss    Shortness of breath     Past Surgical History:  Procedure Laterality Date   CERVICAL FUSION  1997   Dr Jeral Fruit   CHOLECYSTECTOMY  2004   CORONARY ARTERY BYPASS GRAFT  2003   Last catheterization was in August 2008 demonstrated a LIMA to th LAD which was patent, there was an atretic saphenous vein graft to the diagonal, the  LAD had a 90% stenosis in the large calcified segment, the diagonal has ostial 70% stenosis, the circumflex had a ramus intermediate with ostial 25% stenosis, the right coronary artery was dominant.  There was an ulcerated 80%-90% stenosis.     CORONARY STENT PLACEMENT  2008   drug-eluting stent to right coronary artery   CORONARY STENT PLACEMENT Right 12/12/2013   RT CORONARY  DES       DR COOPER    HEMORRHOID SURGERY     LEFT HEART CATHETERIZATION WITH CORONARY/GRAFT ANGIOGRAM N/A 12/12/2013   Procedure: LEFT HEART CATHETERIZATION WITH Isabel Caprice;  Surgeon: Micheline Chapman, MD;  Location[facility] C[facility] LAB;  Service: Cardiovascular;  Laterality: N/A;   TONSILLECTOMY AND ADENOIDECTOMY     UPPER GASTROINTESTINAL ENDOSCOPY      Dr Victorino Dike    Current Outpatient Medications  Medication Instructions   amLODipine (NORVASC) 2.5 MG tablet TAKE 1 TABLET BY MOUTH DAILY   aspirin EC 81 mg, Daily   carbidopa-levodopa (SINEMET IR) 25-100 MG tablet 1 tablet, Oral, 4 times daily   cetirizine (ZYRTEC) 10 mg, Oral, Daily   Cholecalciferol (VITAMIN D) 50 MCG (2000 UT) CAPS Oral, Daily   clonazePAM (KLONOPIN) 0.5 MG tablet One at night po   donepezil (ARICEPT) 10 mg, Oral, Daily at bedtime   fluticasone (FLONASE) 50 MCG/ACT nasal spray  Each Nare, Daily   ketoconazole (NIZORAL) 2 % cream 1 application , Topical, Daily   nitroGLYCERIN (NITROSTAT) 0.4 mg, Sublingual, Every 5 min PRN   Omega-3 Fatty Acids (FISH OIL) 1000 MG CAPS No dose, route, or frequency recorded.   Probiotic Product (PROBIOTIC DAILY PO) 37 mg, Oral, Daily   rosuvastatin (CRESTOR) 10 mg, Oral, Daily   triamcinolone lotion (KENALOG) 0.1 % 1 application , Topical, 2 times daily PRN       Objective:   Physical Exam Pulse 84   Ht 5\' 2"  (1.575 m)   Wt 145 lb (65.8 kg)   BMI 26.52 kg/m  This is a virtual video visit, he is alert, coherent, cooperative.  Wearing a mask.  In no physical or emotional distress that I can tell.    Assessment     Assessment HTN Hyperlipidemia CV: dr --CAD, CABG 2003, stent 2008, cath 2015 [facility]), dc plavix 02-2015 --Aortic sclerosis Echo 2021 Neuro: -Parkinson disease - Dementia OSA  --- REM sleep d/o ---> CPAP. Clonazepam qhs prn [facility] --DJD --Severe kyphosis ---NCS 4-2017chronic radiucolopathy left C 6-7 and T1 and at L  4-5 and S1, saw neuro, rx conservative treatment  ---Osteopenia, nl vit D 2013, T score -2.0 (04-2016)  GERD, h/o PUD Vertigo CT head 2012 and 2015 nonacute. No help with antivert (03-2017)  PLAN COVID-19: The patient has been exposed to COVID-19 by his daughter who has symptoms and tested positive. [name]'s last covid vaccine was 02-2022, he is asymptomatic. Given age and comorbidities I feel it is safer to start him on Molnupiravir, otherwise continue his same medications, rest and fluids. If he developed mild symptoms okay to stay home, if he has severe symptoms recommend ER (chest pain, high fever, difficulty breathing). The patient's wife tested negative for COVID, advised for them to keep her PCP informed and take precautions to avoid passing the infection to her.    I discussed the assessment and treatment plan with the patient. The patient was provided an opportunity to ask questions and all were answered. The patient agreed with the plan and demonstrated an understanding of the instructions.   The patient was advised to call back or seek an in-person evaluation if the symptoms worsen or if the condition fails to improve as anticipated.

## 2022-05-07 NOTE — Assessment & Plan Note (Signed)
COVID-19: The patient has been exposed to COVID-19 by his daughter who has symptoms and tested positive. [name]'s last covid vaccine was 02-2022, he is asymptomatic. Given age and comorbidities I feel it is safer to start him on Molnupiravir, otherwise continue his same medications, rest and fluids. If he developed mild symptoms okay to stay home, if he has severe symptoms recommend ER (chest pain, high fever, difficulty breathing). The patient's wife tested negative for COVID, advised for them to keep her PCP informed and take precautions to avoid passing the infection to her.

## 2022-05-14 ENCOUNTER — Telehealth (INDEPENDENT_AMBULATORY_CARE_PROVIDER_SITE_OTHER): Payer: Medicare Other | Admitting: Neurology

## 2022-05-14 ENCOUNTER — Telehealth: Payer: Self-pay | Admitting: Internal Medicine

## 2022-05-14 ENCOUNTER — Telehealth: Payer: Self-pay | Admitting: Neurology

## 2022-05-14 DIAGNOSIS — G4731 Primary central sleep apnea: Secondary | ICD-10-CM | POA: Diagnosis not present

## 2022-05-14 NOTE — Telephone Encounter (Signed)
Spoke w/ Trish- verbal orders given.  

## 2022-05-14 NOTE — Progress Notes (Signed)
Virtual Visit via Video Note  I connected with [name] on 05/14/22 at  3:30 PM EST by a video enabled telemedicine application and verified that I am speaking with the correct person using two identifiers.  Location: Patient: at his home Provider: in the office    I discussed the limitations of evaluation and management by telemedicine and the availability of in person appointments. The patient expressed understanding and agreed to proceed.  History of Present Illness: HPI:  [name] is a meanwhile  87 y.o. right handed [place] male patient , whom I encountered originally after a referral for a parasomnia evaluation in 2013, from his former PCP, Dr. Linna [name].he has meanwhile descended into REM BD, Lewy body dementia and Parkinson's, with many effects on his daily life. His daughter states memory has worsen. He is more paranoid nad mean now and daughter states he takes his CPAP mask of after a hr of having it on.   02-20-2022: RV :  He dreams now that he is drowning , unpleasant dreams, he takes the mask off- he has a history of central and complex sleep apnea.  He misunderstands a lot of conversations, gets upset, he gets paranoid - and he has physically declined too. He is frail looking, has high level rigidity in extremities and shuffling gait.  He is due for a new CPAP.  His recent HST documented hypersomnia - with long sleep time-  the patient slept 9 hours and 23 minutes.   Percent REM (%):      10.2%             Calculated pAHI (per hour): Calculated apnea-hypopnea index is 21.6/h in REM sleep 26.3/h and in non-REM sleep 20.8/h.The patient slept the whole night in supine sleep position. Snoring data show a mean volume of 41 dB, less than 30% of the total sleep time were accompanied by snoring.       MMSE 24/ 30 , needs to spell a spanish word, visio-spatial dysfunction,  decline in memory.                                     Update May 14, 2022 SS: Here for initial  CPAP visit, got a new CPAP machine 03/06/22. Machine is working ok, harder to fill water chamber. Using full face mask. Over the last week or so had COVID so didn't use CPAP. Tried several masks without tolerance. Daughter puts it on at night, starts leaking at night. He often takes his mask off.  Feels machine is giving off too much pressure.  CPAP data 04/14/2022-05/13/22 showed 29/30 days at 97% usage, greater than 4 hours 25 days at 83%.  Average usage days used 6 hours 40 minutes.  Set pressure 12 cm water.  Leak in the 95th percentile is 71.7.  AHI 7.5.  Apnea index unknown 7.1.  When he wears the CPAP, he talks less, snores less. ESS couldn't be completed VV.   Observations/Objective: Via virtual visit, patient is alert, history is provided by his family, he is sitting at the kitchen table  Assessment and Plan: 1.  OSA on CPAP  -Needs a mask refit due to high AHI 7.5, leak 71.7, I will reduce his set pressure from 12-11 cmH2O to see if better tolerance -I will pull a download in about 1 month to see how the changes are doing  Follow Up Instructions: Has  follow-up with Dr. Vickey [name] in March   I discussed the assessment and treatment plan with the patient. The patient was provided an opportunity to ask questions and all were answered. The patient agreed with the plan and demonstrated an understanding of the instructions.   The patient was advised to call back or seek an in-person evaluation if the symptoms worsen or if the condition fails to improve as anticipated.  Otila Kluver, DNP  [facility] [address], [address] [place], [place] [address] [phone]

## 2022-05-14 NOTE — Telephone Encounter (Signed)
Called pt to remind of MyChart Video Visit for today.

## 2022-05-14 NOTE — Patient Instructions (Signed)
I will reduce the pressure from 12-11 to see if better tolerance I will ask for a mask refit, he currently has a high leak I will pull a download in about 1 month and reach out to you to share the data

## 2022-05-14 NOTE — Telephone Encounter (Signed)
Trish from [facility] just needs approval for one more PT visit as pt missed his visit last week due to covid. Number is listed in the contact info.

## 2022-05-21 ENCOUNTER — Telehealth: Payer: Self-pay

## 2022-05-21 NOTE — Telephone Encounter (Signed)
CPAP pressure change order sent [facility] today.

## 2022-05-27 ENCOUNTER — Telehealth: Payer: Self-pay | Admitting: Internal Medicine

## 2022-05-27 NOTE — Telephone Encounter (Signed)
Tobi Bastos with [facility] called to request verbal orders to release patient from Physical therapy this week. Please call [phone] and ask for Tobi Bastos or Archie Patten to advise.

## 2022-05-27 NOTE — Telephone Encounter (Signed)
Spoke w/ Tobi Bastos- verbal orders given for PT- Pt needing to make up 1 visit.

## 2022-06-17 ENCOUNTER — Encounter: Payer: Self-pay | Admitting: Neurology

## 2022-06-17 DIAGNOSIS — G4731 Primary central sleep apnea: Secondary | ICD-10-CM

## 2022-06-18 NOTE — Telephone Encounter (Signed)
Order sent to DME via electronic message.

## 2022-06-18 NOTE — Telephone Encounter (Deleted)
CPAP order send via community message to [place].

## 2022-06-19 NOTE — Telephone Encounter (Signed)
I reviewed the machine settings on [facility] and it was still set to 11 after orders were sent, so I manually changed the pressure to 10

## 2022-07-25 ENCOUNTER — Telehealth: Payer: Self-pay | Admitting: Neurology

## 2022-07-25 NOTE — Telephone Encounter (Signed)
[name] is calling. Stated a referral was supposed to be sent to them but it was sent to wrong place. [name] is requesting a call back

## 2022-07-28 NOTE — Telephone Encounter (Signed)
I don't see where Dr Brett Fairy recommended therapy. Looks like the patient's PCP placed a HH order. Dr Dohmeier saw the patient last in September and Judson Roch saw the pt in a VV in November and neither visit discusses therapy.

## 2022-08-01 ENCOUNTER — Other Ambulatory Visit: Payer: Self-pay | Admitting: Neurology

## 2022-08-05 ENCOUNTER — Ambulatory Visit: Payer: Medicare Other | Attending: Audiology | Admitting: Audiology

## 2022-08-05 DIAGNOSIS — H903 Sensorineural hearing loss, bilateral: Secondary | ICD-10-CM

## 2022-08-06 ENCOUNTER — Other Ambulatory Visit: Payer: Self-pay | Admitting: Neurology

## 2022-08-06 ENCOUNTER — Encounter: Payer: Self-pay | Admitting: Neurology

## 2022-08-06 NOTE — Procedures (Signed)
[facility] [place], [place]  [id number] [phone]  AUDIOLOGICAL  EVALUATION  NAME: [name]     DOB:   05/31/1935      MRN: [id number]                                                                                     DATE: 08/06/2022     REFERENT: Colon Branch, MD STATUS: Outpatient DIAGNOSIS: Sensorineural hearing loss, bilateral    History: [name] was seen for an audiological evaluation due to decreased hearing occurring for many years. [name] was accompanied to the appointment by his daughter. [name] reports decreased hearing for many years and reports difficulty hearing the television. He denies tinnitus, otalgia, aural fullness,and vertigo. [name]'s daughter reports [name] had difficulty communicating with the family. [name] a significant medical history listed below.   Patient Active Problem List   Diagnosis Date Noted   Central sleep apnea 02/20/2022   Parkinson's disease 02/06/2021   Dementia due to Parkinson's disease without behavioral disturbance ([place]) 02/06/2021   Leg cramping 04/16/2020   Parkinsonian features 02/13/2020   Dementia ([place]) 09/21/2019   Alteration of awareness 09/21/2019   RBD (REM behavioral disorder) 09/21/2019   REM behavioral disorder 07/28/2017   Injury of shoulder, left, subsequent encounter 07/07/2016   OSA on CPAP 01/10/2016   Annual physical exam 10/23/2015   Cervical radiculopathy, chronic 10/17/2015   Lumbar radiculopathy, chronic 10/17/2015   Paresthesia 10/10/2015   Ulnar nerve compression 10/09/2015   PCP NOTES >>>>>>>>>> 09/18/2015   Right lumbar radiculopathy 10/19/2014   CSA (central sleep apnea) 05/25/2014   BPV (benign positional vertigo) 04/18/2014   Aortic regurgitation 01/09/2014   DOE (dyspnea on exertion) 10/15/2013   Other malaise and fatigue 10/15/2013   GERD 09/21/2009   OTH DYSFUNCTIONS SLEEP STAGES/AROUSAL FROM SLEEP 03/14/2009   DJD (degenerative joint  disease) 01/09/2009   Osteopenia 01/09/2009   CAD S/P percutaneous coronary angioplasty - PCI of RCA (12/12/2013) 11/16/2008   DIVERTICULOSIS, COLON 11/16/2008   HYPERLIPIDEMIA 05/04/2007   Essential hypertension 05/04/2007    Evaluation:  Otoscopy showed a clear view of the tympanic membranes, bilaterally Tympanometry results were consistent with normal middle ear pressure and normal tympanic membrane mobility (Type A), bilaterally.  Audiometric testing was completed using Conventional Audiometry techniques with insert earphones and TDH headphones. Test results are consistent with a mild sloping to severe sensorineural hearing loss, bilaterally. Asymmetry noted at 1000 Hz and 2000-4000 Hz worse in the right ear.  Speech Recognition Thresholds were obtained at 50 dB HL in the right ear and at 30  dB HL in the left ear. Word Recognition Testing was completed at 80 dB HL and [name] scored 52% in the right ear and was completed at 75 dB HL and [name] scored 80% in the left ear. Asymmetric word recognition noted, worse int he right ear.        Results:  The test results were reviewed with [name] and his daughter. Today's results are consistent with a mild sloping to severe sensorineural hearing loss, bilaterally. Asymmetry noted at 1000 Hz and 2000-4000 Hz worse in the right ear.  Speech Recognition Thresholds were obtained at 50 dB HL in the right ear and at 30  dB HL in the left ear. Word Recognition Testing was completed at 80 dB HL and [name] scored 52% in the right ear and was completed at 75 dB HL and [name] scored 80% in the left ear. Asymmetric word recognition noted, worse int he right ear.  [name] will have hearing and communication difficulty in all listening environments. He will benefit from the use of good communication strategies, the use of personal sound amplification products (PSAP, Pocket Talkers), or the use of over-the-counter hearing aids, or Traditional hearing aids fit by an [place] daughter was given a copy of the audiogram today. [name] and his daughter were given handouts on Pocket Talkers, OTC Hearings Aids, and Audiologist who work with hearing aids in the [place] area.   Recommendations: 1.   Use of amplification device- Pocket Talker, OTC hearing aid, or schedule a Communication Needs Assessment with an Audiologist to discuss hearing aids.  2.   Use of TV Ears to help hear the television better.  3.   Continue to monitor hearing sensitivity   35 minutes spent testing and counseling on results.   If you have any questions please feel free to contact me at [phone].  Bari Mantis Audiologist, Au.D., CCC-A 08/06/2022  10:31 AM  Cc: Colon Branch, MD

## 2022-08-07 ENCOUNTER — Ambulatory Visit (INDEPENDENT_AMBULATORY_CARE_PROVIDER_SITE_OTHER): Payer: Medicare Other | Admitting: *Deleted

## 2022-08-07 DIAGNOSIS — Z0000 Encounter for general adult medical examination without abnormal findings: Secondary | ICD-10-CM

## 2022-08-07 MED ORDER — CLONAZEPAM 0.5 MG PO TABS: 0.5000 mg | ORAL_TABLET | Freq: Every day | ORAL | 1 refills | Status: DC

## 2022-08-07 NOTE — Patient Instructions (Signed)
[name] , Thank you for taking time to come for your Medicare Wellness Visit. I appreciate your ongoing commitment to your health goals. Please review the following plan we discussed and let me know if I can assist you in the future.     This is a list of the screening recommended for you and due dates:  Health Maintenance  Topic Date Due   COVID-19 Vaccine (7 - 2023-24 season) 05/06/2022   Medicare Annual Wellness Visit  08/08/2023   DTaP/Tdap/Td vaccine (2 - Tdap) 10/22/2025   Pneumonia Vaccine  Completed   Flu Shot  Completed   Zoster (Shingles) Vaccine  Completed   HPV Vaccine  Aged Out     Next appointment: Follow up in one year for your annual wellness visit.   Preventive Care 27 Years and Older, Male Preventive care refers to lifestyle choices and visits with your health care provider that can promote health and wellness. What does preventive care include? A yearly physical exam. This is also called an annual well check. Dental exams once or twice a year. Routine eye exams. Ask your health care provider how often you should have your eyes checked. Personal lifestyle choices, including: Daily care of your teeth and gums. Regular physical activity. Eating a healthy diet. Avoiding tobacco and drug use. Limiting alcohol use. Practicing safe sex. Taking low doses of aspirin every day. Taking vitamin and mineral supplements as recommended by your health care provider. What happens during an annual well check? The services and screenings done by your health care provider during your annual well check will depend on your age, overall health, lifestyle risk factors, and family history of disease. Counseling  Your health care provider may ask you questions about your: Alcohol use. Tobacco use. Drug use. Emotional well-being. Home and relationship well-being. Sexual activity. Eating habits. History of falls. Memory and ability to understand (cognition). Work and work  Statistician. Screening  You may have the following tests or measurements: Height, weight, and BMI. Blood pressure. Lipid and cholesterol levels. These may be checked every 5 years, or more frequently if you are over 35 years old. Skin check. Lung cancer screening. You may have this screening every year starting at age 43 if you have a 30-pack-year history of smoking and currently smoke or have quit within the past 15 years. Fecal occult blood test (FOBT) of the stool. You may have this test every year starting at age 73. Flexible sigmoidoscopy or colonoscopy. You may have a sigmoidoscopy every 5 years or a colonoscopy every 10 years starting at age 75. Prostate cancer screening. Recommendations will vary depending on your family history and other risks. Hepatitis C blood test. Hepatitis B blood test. Sexually transmitted disease (STD) testing. Diabetes screening. This is done by checking your blood sugar (glucose) after you have not eaten for a while (fasting). You may have this done every 1-3 years. Abdominal aortic aneurysm (AAA) screening. You may need this if you are a current or former smoker. Osteoporosis. You may be screened starting at age 9 if you are at high risk. Talk with your health care provider about your test results, treatment options, and if necessary, the need for more tests. Vaccines  Your health care provider may recommend certain vaccines, such as: Influenza vaccine. This is recommended every year. Tetanus, diphtheria, and acellular pertussis (Tdap, Td) vaccine. You may need a Td booster every 10 years. Zoster vaccine. You may need this after age 42. Pneumococcal 13-valent conjugate (PCV13) vaccine. One  dose is recommended after age 39. Pneumococcal polysaccharide (PPSV23) vaccine. One dose is recommended after age 82. Talk to your health care provider about which screenings and vaccines you need and how often you need them. This information is not intended to replace  advice given to you by your health care provider. Make sure you discuss any questions you have with your health care provider. Document Released: 06/29/2015 Document Revised: 02/20/2016 Document Reviewed: 04/03/2015 Elsevier Interactive Patient Education  2017 [place] Prevention in the Home Falls can cause injuries. They can happen to people of all ages. There are many things you can do to make your home safe and to help prevent falls. What can I do on the outside of my home? Regularly fix the edges of walkways and driveways and fix any cracks. Remove anything that might make you trip as you walk through a door, such as a raised step or threshold. Trim any bushes or trees on the path to your home. Use bright outdoor lighting. Clear any walking paths of anything that might make someone trip, such as rocks or tools. Regularly check to see if handrails are loose or broken. Make sure that both sides of any steps have handrails. Any raised decks and porches should have guardrails on the edges. Have any leaves, snow, or ice cleared regularly. Use sand or salt on walking paths during winter. Clean up any spills in your garage right away. This includes oil or grease spills. What can I do in the bathroom? Use night lights. Install grab bars by the toilet and in the tub and shower. Do not use towel bars as grab bars. Use non-skid mats or decals in the tub or shower. If you need to sit down in the shower, use a plastic, non-slip stool. Keep the floor dry. Clean up any water that spills on the floor as soon as it happens. Remove soap buildup in the tub or shower regularly. Attach bath mats securely with double-sided non-slip rug tape. Do not have throw rugs and other things on the floor that can make you trip. What can I do in the bedroom? Use night lights. Make sure that you have a light by your bed that is easy to reach. Do not use any sheets or blankets that are too big for your bed.  They should not hang down onto the floor. Have a firm chair that has side arms. You can use this for support while you get dressed. Do not have throw rugs and other things on the floor that can make you trip. What can I do in the kitchen? Clean up any spills right away. Avoid walking on wet floors. Keep items that you use a lot in easy-to-reach places. If you need to reach something above you, use a strong step stool that has a grab bar. Keep electrical cords out of the way. Do not use floor polish or wax that makes floors slippery. If you must use wax, use non-skid floor wax. Do not have throw rugs and other things on the floor that can make you trip. What can I do with my stairs? Do not leave any items on the stairs. Make sure that there are handrails on both sides of the stairs and use them. Fix handrails that are broken or loose. Make sure that handrails are as long as the stairways. Check any carpeting to make sure that it is firmly attached to the stairs. Fix any carpet that is loose or worn.  Avoid having throw rugs at the top or bottom of the stairs. If you do have throw rugs, attach them to the floor with carpet tape. Make sure that you have a light switch at the top of the stairs and the bottom of the stairs. If you do not have them, ask someone to add them for you. What else can I do to help prevent falls? Wear shoes that: Do not have high heels. Have rubber bottoms. Are comfortable and fit you well. Are closed at the toe. Do not wear sandals. If you use a stepladder: Make sure that it is fully opened. Do not climb a closed stepladder. Make sure that both sides of the stepladder are locked into place. Ask someone to hold it for you, if possible. Clearly mark and make sure that you can see: Any grab bars or handrails. First and last steps. Where the edge of each step is. Use tools that help you move around (mobility aids) if they are needed. These  include: Canes. Walkers. Scooters. Crutches. Turn on the lights when you go into a dark area. Replace any light bulbs as soon as they burn out. Set up your furniture so you have a clear path. Avoid moving your furniture around. If any of your floors are uneven, fix them. If there are any pets around you, be aware of where they are. Review your medicines with your doctor. Some medicines can make you feel dizzy. This can increase your chance of falling. Ask your doctor what other things that you can do to help prevent falls. This information is not intended to replace advice given to you by your health care provider. Make sure you discuss any questions you have with your health care provider. Document Released: 03/29/2009 Document Revised: 11/08/2015 Document Reviewed: 07/07/2014 Elsevier Interactive Patient Education  2017 [employer].

## 2022-08-07 NOTE — Progress Notes (Signed)
Subjective:  Pt completed ADLs, Fall risk, & SDOH during e-check in on 08/04/22.  Answers verified with pt and pt's daughter.    [name] is a 88 y.o. male who presents for Medicare Annual/Subsequent preventive examination.  I connected with  [name] on 08/07/22 by a audio enabled telemedicine application and verified that I am speaking with the correct person using two identifiers.  Patient Location: Home  Provider Location: Office/Clinic  I discussed the limitations of evaluation and management by telemedicine. The patient expressed understanding and agreed to proceed.   Review of Systems    Defer to PCP Cardiac Risk Factors include: advanced age (>24mn, >>21women);male gender;dyslipidemia;hypertension     Objective:    There were no vitals filed for this visit. There is no height or weight on file to calculate BMI.     08/07/2022    3:50 PM 08/06/2021    3:48 PM 10/13/2017   10:14 AM 08/15/2016   10:16 AM 08/11/2016    1:56 PM 01/31/2016   12:51 PM 01/24/2016   11:01 AM  Advanced Directives  Does Patient Have a Medical Advance Directive? Yes Yes Yes Yes Yes Yes Yes  Type of AParamedicof ACrestonLiving will H[place]neyLiving will HBay ViewLiving will HAnamosaLiving will HWalkervilleLiving will HPoint ComfortLiving will Living will;Healthcare Power of Attorney  Does patient want to make changes to medical advance directive? No - Patient declined  No - Patient declined  No - Patient declined    Copy of HEaklyin Chart? No - copy requested No - copy requested No - copy requested  No - copy requested No - copy requested   Would patient like information on creating a medical advance directive? No - Patient declined          Current Medications (verified) Outpatient Encounter Medications as of 08/07/2022  Medication Sig    amLODipine (NORVASC) 2.5 MG tablet TAKE 1 TABLET BY MOUTH DAILY   aspirin EC 81 MG tablet Take 81 mg by mouth daily.   carbidopa-levodopa (SINEMET IR) 25-100 MG tablet Take 1 tablet by mouth 4 (four) times daily.   cetirizine (ZYRTEC) 10 MG tablet Take 10 mg by mouth daily.   Cholecalciferol (VITAMIN D) 50 MCG (2000 UT) CAPS Take by mouth daily.   clonazePAM (KLONOPIN) 0.5 MG tablet Take 1 tablet (0.5 mg total) by mouth at bedtime. Take 1 tablet by mouth nightly   donepezil (ARICEPT) 10 MG tablet Take 1 tablet (10 mg total) by mouth at bedtime.   fluticasone (FLONASE) 50 MCG/ACT nasal spray Place into both nostrils daily.   ketoconazole (NIZORAL) 2 % cream Apply 1 application. topically daily.   nitroGLYCERIN (NITROSTAT) 0.4 MG SL tablet Place 1 tablet (0.4 mg total) under the tongue every 5 (five) minutes as needed for chest pain. (Patient not taking: Reported on 04/16/2022)   Omega-3 Fatty Acids (FISH OIL) 1000 MG CAPS    Probiotic Product (PROBIOTIC DAILY PO) Take 37 mg by mouth daily.   rosuvastatin (CRESTOR) 10 MG tablet Take 1 tablet (10 mg total) by mouth daily.   triamcinolone lotion (KENALOG) 0.1 % Apply 1 application topically 2 (two) times daily as needed (Rash at the back).   No facility-administered encounter medications on file as of 08/07/2022.    Allergies (verified) Codeine   History: Past Medical History:  Diagnosis Date   Anginal pain ([place]  Arthritis    BACK & SHOULDERS   CAD S/P percutaneous coronary angioplasty 2003   Dr Percival Spanish: 2003: a) s/p CABG (LIMA-LAD, SVG-Diag);b) 2008: prior PCI to native RCA; c) 12/12/2013: SVG-Diag occluded, patent LIMA, 95% post stent in RCA --> PCI  with 2.75 x 18 mm Xience Alpine DES, residual dRCA-PLA 80=-90%, EF 55-65%   Diverticulosis    GERD (gastroesophageal reflux disease)    Gilbert's syndrome    Heart murmur    Hyperlipidemia    Hypertension    Osteopenia 2010    T score -1.9 @ hip (femoral neck)   PUD (peptic ulcer  disease)    PMH , ? 1995   Short-term memory loss    Shortness of breath    Past Surgical History:  Procedure Laterality Date   CERVICAL FUSION  1997   Dr Joya Salm   CHOLECYSTECTOMY  2004   CORONARY ARTERY BYPASS GRAFT  2003   Last catheterization was in August 2008 demonstrated a LIMA to th LAD which was patent, there was an atretic saphenous vein graft to the diagonal, the LAD had a 90% stenosis in the large calcified segment, the diagonal has ostial 70% stenosis, the circumflex had a ramus intermediate with ostial 25% stenosis, the right coronary artery was dominant.  There was an ulcerated 80%-90% stenosis.     CORONARY STENT PLACEMENT  2008   drug-eluting stent to right coronary artery   CORONARY STENT PLACEMENT Right 12/12/2013   RT CORONARY  DES       DR COOPER    HEMORRHOID SURGERY     LEFT HEART CATHETERIZATION WITH CORONARY/GRAFT ANGIOGRAM N/A 12/12/2013   Procedure: LEFT HEART CATHETERIZATION WITH Beatrix Fetters;  Surgeon: Blane Ohara, MD;  [facility] C[facility] LAB;  Service: Cardiovascular;  Laterality: N/A;   TONSILLECTOMY AND ADENOIDECTOMY     UPPER GASTROINTESTINAL ENDOSCOPY      Dr Lyla Son   Family History  Problem Relation Age of Onset   Cancer Father        ? primary   Uterine cancer Daughter    Hypertension Maternal Grandmother    Peripheral vascular disease Maternal Grandmother    Osteoporosis Sister    Diabetes Sister    Stroke Neg Hx    Heart attack Neg Hx    Colon cancer Neg Hx    Prostate cancer Neg Hx    Social History   Socioeconomic History   Marital status: Married    Spouse name: Barrister's clerk   Number of children: 2   Years of education: Bachelors   Highest education level: Master's degree (e.g., MA, MS, MEng, MEd, MSW, MBA)  Occupational History   Occupation: retired     Fish farm manager: RETIRED  Tobacco Use   Smoking status: Former    Types: Cigarettes    Quit date: 02/25/1979    Years since quitting: 43.4   Smokeless tobacco: Never    Tobacco comments:    smoked 1955-1980, up to 1 ppd  Substance and Sexual Activity   Alcohol use: Yes    Comment: 1/2 cup / day of wine or 1 can beer/day   Drug use: No   Sexual activity: Never  Other Topics Concern   Not on file  Social History Narrative   Patient is married ([place]) and lives at home with his wife and daughter [name] .   Patient has two adult children- daughters x 2  ([place], [place])   Patient is retired.   Patient has a Bachelor's degree.  Patient is right-handed.   Patient drinks 1/2 liter of green tea daily.   Walks once week for exercise   Born in [place] Determinants of Health   Financial Resource Strain: Low Risk  (08/04/2022)   Overall Financial Resource Strain (CARDIA)    Difficulty of Paying Living Expenses: Not very hard  Food Insecurity: No Food Insecurity (08/04/2022)   Hunger Vital Sign    Worried About Running Out of Food in the Last Year: Never true    Ran Out of Food in the Last Year: Never true  Transportation Needs: No Transportation Needs (08/04/2022)   PRAPARE - Hydrologist (Medical): No    Lack of Transportation (Non-Medical): No  Physical Activity: Inactive (08/04/2022)   Exercise Vital Sign    Days of Exercise per Week: 0 days    Minutes of Exercise per Session: 0 min  Stress: Stress Concern Present (08/04/2022)   [place]    Feeling of Stress : Very much  Social Connections: Unknown (08/04/2022)   Social Connection and Isolation Panel [NHANES]    Frequency of Communication with Friends and Family: Once a week    Frequency of Social Gatherings with Friends and Family: Once a week    Attends Religious Services: Not on Advertising copywriter or Organizations: No    Attends Archivist Meetings: Never    Marital Status: Married    Tobacco Counseling Counseling given: Not Answered Tobacco comments: smoked  1955-1980, up to 1 ppd   Clinical Intake:  Pre-visit preparation completed: Yes  Pain : No/denies pain  Diabetes: No  How often do you need to have someone help you when you read instructions, pamphlets, or other written materials from your doctor or pharmacy?: 5 - Always   Activities of Daily Living    08/04/2022   12:23 PM  In your present state of health, do you have any difficulty performing the following activities:  Hearing? 1  Vision? 0  Difficulty concentrating or making decisions? 1  Walking or climbing stairs? 0  Dressing or bathing? 1  Doing errands, shopping? 1  Preparing Food and eating ? Y  Using the Toilet? N  In the past six months, have you accidently leaked urine? N  Do you have problems with loss of bowel control? N  Managing your Medications? Y  Managing your Finances? Y  Housekeeping or managing your Housekeeping? Y    Patient Care Team: Colon Branch, MD as PCP - General (Internal Medicine) [place], Peter M, MD as PCP - Cardiology (Cardiology) Dohmeier, Asencion Partridge, MD as Consulting Physician (Neurology) [place], Peter M, MD as Consulting Physician (Cardiology)  Indicate any recent Medical Services you may have received from other than [facility] providers in the past year (date may be approximate).     Assessment:   This is a routine wellness examination for [place].  Hearing/Vision screen No results found.  Dietary issues and exercise activities discussed: Current Exercise Habits: Home exercise routine, Type of exercise: Other - see comments (does squats and uses resistance bands), Time (Minutes): 10, Frequency (Times/Week): 2, Weekly Exercise (Minutes/Week): 20, Intensity: Mild, Exercise limited by: None identified   Goals Addressed   None    Depression Screen    08/07/2022    3:46 PM 04/16/2022    2:53 PM 10/14/2021    2:24 PM 08/06/2021    3:53 PM 04/12/2021  3:22 PM 07/09/2020    4:06 PM 09/02/2019    2:28 PM  PHQ 2/9 Scores  PHQ - 2 Score  0 0 0  0 0 0  Exception Documentation Other- indicate reason in comment box        Not completed unable to answer questions          Fall Risk    08/04/2022   12:23 PM 04/16/2022    2:53 PM 10/14/2021    2:24 PM 08/06/2021    3:50 PM 06/19/2021    3:20 PM  [place] in the past year? 1 1 0 0 0  Number falls in past yr: 0 0 0 0   Injury with Fall? 1 0 0 0   Risk for fall due to : History of fall(s)      Follow up Falls evaluation completed Falls evaluation completed Falls evaluation completed Falls prevention discussed     FALL RISK PREVENTION PERTAINING TO THE HOME:  Any stairs in or around the home? Yes  If so, are there any without handrails? No  Home free of loose throw rugs in walkways, pet beds, electrical cords, etc? Yes  Adequate lighting in your home to reduce risk of falls? Yes   ASSISTIVE DEVICES UTILIZED TO PREVENT FALLS:  Life alert? No  Use of a cane, walker or w/c? No  Grab bars in the bathroom? No  Shower chair or bench in shower? Yes  Elevated toilet seat or a handicapped toilet? No   TIMED UP AND GO:  Was the test performed?  No, audio visit .    Cognitive Function:    08/07/2022    3:52 PM 02/20/2022    2:55 PM 08/01/2021    3:51 PM 02/06/2021    2:28 PM 11/06/2020   11:48 AM  MMSE - Mini Mental State Exam  Not completed: Unable to complete      Orientation to time  4 4 4 5  $ Orientation to Place  5 5 5 5  $ Registration  3 2 3 3  $ Attention/ Calculation  1 1 1 4  $ Recall  3 2 2 3  $ Language- name 2 objects  2 2 2 2  $ Language- repeat  1 0 0 1  Language- follow 3 step command  3 3 3 3  $ Language- read & follow direction  1 1 1 1  $ Write a sentence  1 1 0 1  Copy design  0 0 1 0  Total score  24 21 22 28      $ 01/10/2016   10:03 AM 01/10/2016    9:55 AM 05/30/2015    4:13 PM 05/30/2015    3:59 PM  Montreal Cognitive Assessment   Visuospatial/ Executive (0/5) 4 4 3 5  $ Naming (0/3) 3 3 3 3  $ Attention: Read list of digits (0/2) 2 2 2 2  $ Attention: Read  list of letters (0/1) 1 1 1 1  $ Attention: Serial 7 subtraction starting at 100 (0/3) 3 3 3 3  $ Language: Repeat phrase (0/2) 0 0 1 2  Language : Fluency (0/1) 0 0 0 1  Abstraction (0/2) 2 2 1 2  $ Delayed Recall (0/5) 2 2 3 5  $ Orientation (0/6) 6 6 6 6  $ Total 23 23 23 30  $ Adjusted Score (based on education)  23  30      Immunizations Immunization History  Administered Date(s) Administered   Fluad Quad(high Dose 65+) 03/07/2020, 04/10/2021   Influenza Split  03/02/2012   Influenza Whole 04/27/2007, 03/29/2008, 04/25/2009   Influenza, High Dose Seasonal PF 02/19/2016, 04/07/2017, 06/29/2018   Influenza,inj,Quad PF,6+ Mos 04/10/2014, 08/31/2015   Influenza-Unspecified 03/11/2022   Moderna SARS-COV2 Booster Vaccination 09/19/2020   PFIZER(Purple Top)SARS-COV-2 Vaccination 08/09/2019, 08/30/2019, 03/19/2020   PNEUMOCOCCAL CONJUGATE-20 10/14/2021   Pfizer Covid-19 Vaccine Bivalent Booster 39yr & up 02/25/2021, 03/11/2022   Pneumococcal Conjugate-13 10/19/2014   Pneumococcal Polysaccharide-23 10/23/2015   Td 10/23/2015   Zoster Recombinat (Shingrix) 08/22/2021, 11/08/2021    TDAP status: Up to date  Flu Vaccine status: Up to date  Pneumococcal vaccine status: Up to date  Covid-19 vaccine status: Information provided on how to obtain vaccines.   Qualifies for Shingles Vaccine? Yes   Zostavax completed No   Shingrix Completed?: Yes  Screening Tests Health Maintenance  Topic Date Due   COVID-19 Vaccine (7 - 2023-24 season) 05/06/2022   Medicare Annual Wellness (AWV)  08/06/2022   DTaP/Tdap/Td (2 - Tdap) 10/22/2025   Pneumonia Vaccine 88 Years old  Completed   INFLUENZA VACCINE  Completed   Zoster Vaccines- Shingrix  Completed   HPV VACCINES  Aged Out    Health Maintenance  Health Maintenance Due  Topic Date Due   COVID-19 Vaccine (7 - 2023-24 season) 05/06/2022   Medicare Annual Wellness (AWV)  08/06/2022    Colorectal cancer screening: No longer required.   Lung  Cancer Screening: (Low Dose CT Chest recommended if Age 86-80years, 30 pack-year currently smoking OR have quit w/in 15years.) does not qualify.   Additional Screening:  Hepatitis C Screening: does not qualify  Vision Screening: Recommended annual ophthalmology exams for early detection of glaucoma and other disorders of the eye. Is the patient up to date with their annual eye exam?  Yes  Who is the provider or what is the name of the office in which the patient attends annual eye exams? Dr. JMelissa NoonIf pt is not established with a provider, would they like to be referred to a provider to establish care? No .   Dental Screening: Recommended annual dental exams for proper oral hygiene  Community Resource Referral / Chronic Care Management: CRR required this visit?  No   CCM required this visit?  No      Plan:     I have personally reviewed and noted the following in the patient's chart:   Medical and social history Use of alcohol, tobacco or illicit drugs  Current medications and supplements including opioid prescriptions. Patient is not currently taking opioid prescriptions. Functional ability and status Nutritional status Physical activity Advanced directives List of other physicians Hospitalizations, surgeries, and ER visits in previous 12 months Vitals Screenings to include cognitive, depression, and falls Referrals and appointments  In addition, I have reviewed and discussed with patient certain preventive protocols, quality metrics, and best practice recommendations. A written personalized care plan for preventive services as well as general preventive health recommendations were provided to patient.   Due to this being a telephonic visit, the after visit summary with patients personalized plan was offered to patient via mail or my-chart. Patient would like to access on my-chart.  BBeatris Ship [place]  08/07/2022   Nurse Notes: None

## 2022-08-26 ENCOUNTER — Encounter: Payer: Self-pay | Admitting: Internal Medicine

## 2022-08-26 ENCOUNTER — Ambulatory Visit (INDEPENDENT_AMBULATORY_CARE_PROVIDER_SITE_OTHER): Payer: Medicare Other | Admitting: Neurology

## 2022-08-26 ENCOUNTER — Encounter: Payer: Self-pay | Admitting: Neurology

## 2022-08-26 ENCOUNTER — Ambulatory Visit: Payer: Medicare Other | Admitting: Internal Medicine

## 2022-08-26 VITALS — BP 122/76 | HR 47 | Temp 98.0°F | Resp 18 | Ht 62.0 in | Wt 142.2 lb

## 2022-08-26 DIAGNOSIS — F03B0 Unspecified dementia, moderate, without behavioral disturbance, psychotic disturbance, mood disturbance, and anxiety: Secondary | ICD-10-CM

## 2022-08-26 DIAGNOSIS — I10 Essential (primary) hypertension: Secondary | ICD-10-CM | POA: Diagnosis not present

## 2022-08-26 DIAGNOSIS — G20A1 Parkinson's disease without dyskinesia, without mention of fluctuations: Secondary | ICD-10-CM

## 2022-08-26 DIAGNOSIS — E782 Mixed hyperlipidemia: Secondary | ICD-10-CM

## 2022-08-26 DIAGNOSIS — F02B18 Dementia in other diseases classified elsewhere, moderate, with other behavioral disturbance: Secondary | ICD-10-CM | POA: Diagnosis not present

## 2022-08-26 DIAGNOSIS — F0280 Dementia in other diseases classified elsewhere without behavioral disturbance: Secondary | ICD-10-CM | POA: Diagnosis not present

## 2022-08-26 MED ORDER — DONEPEZIL HCL 10 MG PO TABS: 10.0000 mg | ORAL_TABLET | Freq: Every day | ORAL | 3 refills | Status: DC

## 2022-08-26 MED ORDER — CARBIDOPA-LEVODOPA 25-100 MG PO TABS: 1.0000 | ORAL_TABLET | Freq: Four times a day (QID) | ORAL | 3 refills | Status: DC

## 2022-08-26 MED ORDER — CLONAZEPAM 0.5 MG PO TABS: 0.5000 mg | ORAL_TABLET | Freq: Every day | ORAL | 1 refills | Status: DC

## 2022-08-26 MED ORDER — SERTRALINE HCL 25 MG PO TABS: 25.0000 mg | ORAL_TABLET | Freq: Every day | ORAL | 1 refills | Status: DC

## 2022-08-26 NOTE — Patient Instructions (Addendum)
Please bring [place] a copy of your Healthcare Power of Attorney for your chart.   Check the  blood pressure regularly BP GOAL is between 110/65 and  135/85. If it is consistently higher or lower, let me know      GO TO THE LAB : Get the blood work     [place], [place] back for checkup in 4 months

## 2022-08-26 NOTE — Progress Notes (Unsigned)
Subjective:    Patient ID: [name], male    DOB: [birth year], 88 y.o.   MRN: [id number]  DOS:  08/26/2022 Type of visit - description: f/u, here with his daughter  Since the last office visit things are stable. No recent fall. Parkinson symptoms about the same Memory, slightly worse?[name] History of heart disease and aortic sclerosis, denies chest pain, DOE and chronic fatigue at baseline.  Wt Readings from Last 3 Encounters:  08/26/22 142 lb 4 oz (64.5 kg)  05/06/22 145 lb (65.8 kg)  04/16/22 145 lb 4 oz (65.9 kg)     Review of Systems See above   Past Medical History:  Diagnosis Date   Anginal pain ([place])    Arthritis    BACK & SHOULDERS   CAD S/P percutaneous coronary angioplasty 2003   Dr Percival Spanish: 2003: a) s/p CABG (LIMA-LAD, SVG-Diag);b) 2008: prior PCI to native RCA; c) 12/12/2013: SVG-Diag occluded, patent LIMA, 95% post stent in RCA --> PCI  with 2.75 x 18 mm Xience Alpine DES, residual dRCA-PLA 80=-90%, EF 55-65%   Diverticulosis    GERD (gastroesophageal reflux disease)    Gilbert's syndrome    Heart murmur    Hyperlipidemia    Hypertension    Osteopenia 2010    T score -1.9 @ hip (femoral neck)   PUD (peptic ulcer disease)    PMH , ? 1995   Short-term memory loss    Shortness of breath     Past Surgical History:  Procedure Laterality Date   CERVICAL FUSION  1997   Dr Joya Salm   CHOLECYSTECTOMY  2004   [place] GRAFT  2003   Last catheterization was in August 2008 demonstrated a LIMA to th LAD which was patent, there was an atretic saphenous vein graft to the diagonal, the LAD had a 90% stenosis in the large calcified segment, the diagonal has ostial 70% stenosis, the circumflex had a ramus intermediate with ostial 25% stenosis, the right coronary artery was dominant.  There was an ulcerated 80%-90% stenosis.     CORONARY STENT PLACEMENT  2008   drug-eluting stent to right coronary artery   CORONARY STENT PLACEMENT Right 12/12/2013   RT  CORONARY  DES       DR COOPER    HEMORRHOID SURGERY     LEFT HEART CATHETERIZATION WITH CORONARY/GRAFT ANGIOGRAM N/A 12/12/2013   Procedure: LEFT HEART CATHETERIZATION WITH Beatrix Fetters;  Surgeon: Blane Ohara, MD;  Location: [facility] CATH LAB;  Service: Cardiovascular;  Laterality: N/A;   TONSILLECTOMY AND ADENOIDECTOMY     UPPER GASTROINTESTINAL ENDOSCOPY      Dr Lyla Son    Current Outpatient Medications  Medication Instructions   amLODipine (NORVASC) 2.5 MG tablet TAKE 1 TABLET BY MOUTH DAILY   aspirin EC 81 mg, Daily   carbidopa-levodopa (SINEMET IR) 25-100 MG tablet 1 tablet, Oral, 4 times daily   cetirizine (ZYRTEC) 10 mg, Oral, Daily   Cholecalciferol (VITAMIN D) 50 MCG (2000 UT) CAPS Oral, Daily   clonazePAM (KLONOPIN) 0.5 mg, Oral, Daily at bedtime, Take 1 tablet by mouth nightly   donepezil (ARICEPT) 10 mg, Oral, Daily at bedtime   fluticasone (FLONASE) 50 MCG/ACT nasal spray Each Nare, Daily   ketoconazole (NIZORAL) 2 % cream 1 application , Topical, Daily   ketoconazole (NIZORAL) 2 % shampoo 1 Application, Topical, Every other day   nitroGLYCERIN (NITROSTAT) 0.4 mg, Sublingual, Every 5 min PRN   Omega-3 Fatty Acids (FISH OIL) 1000 MG CAPS  No dose, route, or frequency recorded.   Probiotic Product (PROBIOTIC DAILY PO) 37 mg, Oral, Daily   rosuvastatin (CRESTOR) 10 mg, Oral, Daily   triamcinolone lotion (KENALOG) 0.1 % 1 application , Topical, 2 times daily PRN       Objective:   Physical Exam BP 122/76   Pulse (!) 47   Temp 98 F (36.7 C) (Oral)   Resp 18   Ht '5\' 2"'$  (1.575 m)   Wt 142 lb 4 oz (64.5 kg)   SpO2 98%   BMI 26.02 kg/m  General:   Well developed, NAD, BMI noted. HEENT:  Normocephalic . Face symmetric, atraumatic Lungs:  CTA B Normal respiratory effort, no intercostal retractions, no accessory muscle use. Heart: RRR, significant systolic murmur Lower extremities: no pretibial edema bilaterally  Skin: Not pale. Not  jaundice Neurologic:  alert & pleasant, cooperative. Speech normal, gait appropriate for age, needs some assistance from his daughter. Psych--  Behavior appropriate. No anxious or depressed appearing.      Assessment   Assessment HTN Hyperlipidemia CV: dr [place] --CAD, CABG 2003, stent 2008, cath 2015 [facility]), dc plavix 02-2015 --Aortic sclerosis Echo 2021 Neuro: -Parkinson disease - Dementia OSA  --- REM sleep d/o ---> CPAP. Clonazepam qhs prn MSK --DJD --Severe kyphosis ---NCS 4-2017chronic radiucolopathy left C 6-7 and T1 and at L  4-5 and S1, saw neuro, rx conservative treatment  ---Osteopenia, nl vit D 2013, T score -2.0 (04-2016)  GERD, h/o PUD Vertigo CT head 2012 and 2015 nonacute. No help with antivert (03-2017)  PLAN HTN: Ambulatory BPs normal Continue amlodipine, check BMP. High cholesterol: On rosuvastatin, controlled, check AST ALT. CAD, aortic sclerosis: Noticeable murmur, symptoms at baseline.  No change for now. Dementia, sleep apnea, Parkinson: per neurology. Vaccines: Up-to-date including a recent RSV. Social: Daughter takes care of the patient, requesting a jury duty [employer].  Will do RTC 4 months

## 2022-08-26 NOTE — Patient Instructions (Signed)
Sertraline Solution What is this medication? SERTRALINE (SER tra leen) treats depression, anxiety, obsessive-compulsive disorder (OCD), post-traumatic stress disorder (PTSD), and premenstrual dysphoric disorder (PMDD). It increases the amount of serotonin in the brain, a hormone that helps regulate mood. It belongs to a group of medications called SSRIs. This medicine may be used for other purposes; ask your health care provider or pharmacist if you have questions. COMMON BRAND NAME(S): Zoloft, Zoloft Concentrate, Zoloft Solution What should I tell my care team before I take this medication? They need to know if you have any of these conditions: Bipolar disorder or a family history of bipolar disorder Bleeding disorders Frequently drink alcohol Glaucoma Heart disease High blood pressure History of irregular heartbeat History of low levels of calcium, magnesium, or potassium in the blood Liver disease Receiving electroconvulsive therapy Seizures Suicidal thoughts, plans, or attempt by you or a family member Take medications that prevent or treat blood clots Thyroid disease An unusual or allergic reaction to sertraline, other medications, foods, dyes, or preservatives Pregnant or trying to get pregnant Breastfeeding How should I use this medication? Take this medication by mouth. Follow the directions on the prescription label. Before taking your dose, you need to dilute the solution in a beverage. Measure your medication dose using the dropper in the bottle. Next, place the measured dose in at least 4 ounces (one-half cup) of water, ginger-ale, lemon-lime soda, lemonade or orange juice and mix. Do not mix in grapefruit juice or in any other liquids other than those listed. Drink all of mixed liquid immediately. Do not mix the dose and store it for later. It must be taken right away. You may take this medication with or without food. Take your medication at regular intervals. Do not take  your medication more often than directed. Do not stop taking this medication suddenly except upon the advice of your care team. Stopping this medication too quickly may cause serious side effects or your condition may worsen. A special MedGuide will be given to you by the pharmacist with each prescription and refill. Be sure to read this information carefully each time. Talk to your care team about the use of this medication in children. While this medication may be prescribed for children as young as 7 years for selected conditions, precautions do apply. Overdosage: If you think you have taken too much of this medicine contact a poison control center or emergency room at once. NOTE: This medicine is only for you. Do not share this medicine with others. What if I miss a dose? If you miss a dose, take it as soon as you can. If it is almost time for your next dose, take only that dose. Do not take double or extra doses. What may interact with this medication? Do not take this medication with any of the following: Cisapride Dronedarone Linezolid MAOIs, such as Carbex, Eldepryl, Marplan, Nardil, and Parnate Methylene blue (injected into a vein) Pimozide Thioridazine This medication may also interact with the following: Alcohol Amphetamines Aspirin and aspirin-like medications Certain medications for fungal infections, such as ketoconazole, fluconazole, posaconazole, itraconazole Certain medications for irregular heart beat, such as flecainide, quinidine, propafenone Certain medications for mental health conditions Certain medications for migraine headaches, such as almotriptan, eletriptan, frovatriptan, naratriptan, rizatriptan, sumatriptan, zolmitriptan Certain medications for seizures, such as carbamazepine, valproic acid, phenytoin Certain medications for sleep Certain medications that prevent or treat blood clots, such as warfarin, enoxaparin,  dalteparin Cimetidine Digoxin Diuretics Fentanyl Isoniazid Lithium NSAIDs, medications for pain and  inflammation, such as ibuprofen or naproxen Other medications that cause heart rhythm changes, such as dofetilide Rasagiline Safinamide Supplements, such as St. John's wort, kava kava, valerian Tolbutamide Tramadol Tryptophan This list may not describe all possible interactions. Give your health care provider a list of all the medicines, herbs, non-prescription drugs, or dietary supplements you use. Also tell them if you smoke, drink alcohol, or use illegal drugs. Some items may interact with your medicine. What should I watch for while using this medication? Visit your care team for regular checks on your progress. Tell your care team if your symptoms do not start to get better or if they get worse. Because it may take several weeks to see the full effects of this medication, it is important to continue your treatment as prescribed by your care team. Patients and their families should watch out for new or worsening thoughts of suicide or depression. Also watch out for sudden changes in feelings, such as feeling anxious, agitated, panicky, irritable, hostile, aggressive, impulsive, severely restless, overly excited and hyperactive, or not being able to sleep. If this happens, especially at the beginning of treatment or after a change in dose, call your care team. This medication may affect your coordination, reaction time, or judgment. Do not drive or operate machinery until you know how this medication affects you. Sit up or stand slowly to reduce the risk of dizzy or fainting spells. Drinking alcohol with this medication can increase the risk of these side effects. Your mouth may get dry. Chewing sugarless gum or sucking hard candy and drinking plenty of water may help. Contact your care team if the problem does not go away or is severe. Some products may contain alcohol. Ask your pharmacist or  care team if this medication contains alcohol. Be sure to tell all healthcare providers you are taking this medication. Certain medications, like metronidazole and disulfiram, can cause an unpleasant reaction when taken with alcohol. The reaction includes flushing, headache, nausea, vomiting, sweating, and increased thirst. The reaction can last from 30 minutes to several hours. What side effects may I notice from receiving this medication? Side effects that you should report to your care team as soon as possible: Allergic reactions--skin rash, itching, hives, swelling of the face, lips, tongue, or throat Bleeding--bloody or black, tar-like stools, red or dark brown urine, vomiting blood or brown material that looks like coffee grounds, small red or purple spots on skin, unusual bleeding or bruising Heart rhythm changes--fast or irregular heartbeat, dizziness, feeling faint or lightheaded, chest pain, trouble breathing Low sodium level--muscle weakness, fatigue, dizziness, headache, confusion Serotonin syndrome--irritability, confusion, fast or irregular heartbeat, muscle stiffness, twitching muscles, sweating, high fever, seizure, chills, vomiting, diarrhea Sudden eye pain or change in vision such as blurred vision, seeing halos around lights, vision loss Thoughts of suicide or self-harm, worsening mood Side effects that usually do not require medical attention (report these to your care team if they continue or are bothersome): Change in sex drive or performance Diarrhea Excessive sweating Nausea Tremors or shaking Upset stomach This list may not describe all possible side effects. Call your doctor for medical advice about side effects. You may report side effects to FDA at 1-800-FDA-1088. Where should I keep my medication? Keep out of the reach of children and pets. Store at room temperature between 20 and 25 degrees C (68 and 77 degrees F). Get rid of any unused medication after the  expiration date. To get rid of medications that are no longer  needed or expired: Take the medication to a medication take-back program. Check with your pharmacy or law enforcement to find a location. If you cannot return the medication, check the label or package insert to see if the medication should be thrown out in the garbage or flushed down the toilet. If you are not sure, ask your care team. If it is safe to put in the trash, empty the medication out of the container. Mix the medication with cat litter, dirt, coffee grounds, or other unwanted substance. Seal the mixture in a bag or container. Put it in the trash. NOTE: This sheet is a summary. It may not cover all possible information. If you have questions about this medicine, talk to your doctor, pharmacist, or health care provider.  2023 Elsevier/Gold Standard (2021-12-31 00:00:00)
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
## 2022-08-27 LAB — BASIC METABOLIC PANEL
BUN: 13 mg/dL (ref 6–23)
CO2: 30 mEq/L (ref 19–32)
Calcium: 9.9 mg/dL (ref 8.4–10.5)
Chloride: 99 mEq/L (ref 96–112)
Creatinine, Ser: 0.9 mg/dL (ref 0.40–1.50)
GFR: 77.99 mL/min (ref >60.00)
Glucose, Bld: 95 mg/dL (ref 70–99)
Potassium: 4.5 mEq/L (ref 3.5–5.1)
Sodium: 137 mEq/L (ref 135–145)

## 2022-08-27 LAB — AST: AST: 14 U/L (ref 0–37)

## 2022-08-27 LAB — ALT: ALT: 5 U/L (ref 0–53)

## 2022-08-28 NOTE — Assessment & Plan Note (Signed)
HTN: Ambulatory BPs normal Continue amlodipine, check BMP. High cholesterol: On rosuvastatin, controlled, check AST ALT. CAD, aortic sclerosis: Noticeable murmur, symptoms at baseline.  No change for now. Dementia, sleep apnea, Parkinson: per neurology. Vaccines: Up-to-date including a recent RSV. Social: Daughter takes care of the patient, requesting a jury duty Quarry manager.  Will do RTC 4 months

## 2022-12-26 ENCOUNTER — Other Ambulatory Visit: Payer: Self-pay | Admitting: Cardiology

## 2022-12-26 ENCOUNTER — Ambulatory Visit: Payer: Medicare Other | Admitting: Internal Medicine

## 2022-12-26 ENCOUNTER — Ambulatory Visit (HOSPITAL_BASED_OUTPATIENT_CLINIC_OR_DEPARTMENT_OTHER)
Admission: RE | Admit: 2022-12-26 | Discharge: 2022-12-26 | Disposition: A | Discharge status: Home / Self Care | Payer: Medicare Other | Source: Ambulatory Visit | Attending: Internal Medicine | Admitting: Internal Medicine

## 2022-12-26 ENCOUNTER — Other Ambulatory Visit: Payer: Self-pay | Admitting: Family

## 2022-12-26 VITALS — BP 110/80 | HR 76 | Ht 62.0 in | Wt 138.0 lb

## 2022-12-26 DIAGNOSIS — F03B0 Unspecified dementia, moderate, without behavioral disturbance, psychotic disturbance, mood disturbance, and anxiety: Secondary | ICD-10-CM | POA: Diagnosis not present

## 2022-12-26 DIAGNOSIS — M545 Low back pain, unspecified: Secondary | ICD-10-CM | POA: Insufficient documentation

## 2022-12-26 DIAGNOSIS — I10 Essential (primary) hypertension: Secondary | ICD-10-CM | POA: Diagnosis not present

## 2022-12-26 DIAGNOSIS — E782 Mixed hyperlipidemia: Secondary | ICD-10-CM | POA: Diagnosis not present

## 2022-12-26 NOTE — Progress Notes (Unsigned)
Subjective:    Patient ID: [name], male    DOB: 07-31-1934, 88 y.o.   MRN: [id number]  DOS:  12/26/2022 Type of visit - description: Follow-up, here with his daughter [name].  About 2 weeks ago developed suddenly low back pain. Pain is bilaterally, no known fall or injury.  No abdominal pain.   Pain radiates to both thighs (laterally). Denies bladder or bowel incontinence, chronic constipation at baseline. No LUTS  The daughter gave her a combination of Advil and Tylenol for several days which helped. Currently pain is better and is only required Advil and Tylenol at bedtime.  Reportedly his  general mental status has deteriorated (h/o dementia) No fever or chills  Also, the daughter showed me some picture of his legs, because he has a "rash".  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  Wt Readings from Last 3 Encounters:  12/26/22 138 lb (62.6 kg)  08/26/22 141 lb (64 kg)  08/26/22 142 lb 4 oz (64.5 kg)   Review of Systems See above   Past Medical History:  Diagnosis Date   Anginal pain (HCC)    Arthritis    BACK & SHOULDERS   CAD S/P percutaneous coronary angioplasty 2003   Dr Antoine Poche: 2003: a) s/p CABG (LIMA-LAD, SVG-Diag);b) 2008: prior PCI to native RCA; c) 12/12/2013: SVG-Diag occluded, patent LIMA, 95% post stent in RCA --> PCI  with 2.75 x 18 mm Xience Alpine DES, residual dRCA-PLA 80=-90%, EF 55-65%   Diverticulosis    GERD (gastroesophageal reflux disease)    Gilbert's syndrome    Heart murmur    Hyperlipidemia    Hypertension    Osteopenia 2010    T score -1.9 @ hip (femoral neck)   PUD (peptic ulcer disease)    PMH , ? 1995   Short-term memory loss    Shortness of breath     Past Surgical History:  Procedure Laterality Date   CERVICAL FUSION  1997   Dr Jeral Fruit   CHOLECYSTECTOMY  2004   CORONARY ARTERY BYPASS GRAFT  2003   Last catheterization was in August 2008 demonstrated a LIMA to th LAD which was patent, there was an atretic  saphenous vein graft to the diagonal, the LAD had a 90% stenosis in the large calcified segment, the diagonal has ostial 70% stenosis, the circumflex had a ramus intermediate with ostial 25% stenosis, the right coronary artery was dominant.  There was an ulcerated 80%-90% stenosis.     CORONARY STENT PLACEMENT  2008   drug-eluting stent to right coronary artery   CORONARY STENT PLACEMENT Right 12/12/2013   RT CORONARY  DES       DR COOPER    HEMORRHOID SURGERY     LEFT HEART CATHETERIZATION WITH CORONARY/GRAFT ANGIOGRAM N/A 12/12/2013   Procedure: LEFT HEART CATHETERIZATION WITH Isabel Caprice;  Surgeon: Micheline Chapman, MD;  Location: [facility] CATH LAB;  Service: Cardiovascular;  Laterality: N/A;   TONSILLECTOMY AND ADENOIDECTOMY     UPPER GASTROINTESTINAL ENDOSCOPY      Dr Victorino Dike    Current Outpatient Medications  Medication Instructions   amLODipine (NORVASC) 2.5 MG tablet TAKE 1 TABLET BY MOUTH DAILY   aspirin EC 81 mg, Daily   carbidopa-levodopa (SINEMET IR) 25-100 MG tablet 1 tablet, Oral, 4 times daily   cetirizine (ZYRTEC) 10 mg, Oral, Daily   Cholecalciferol (VITAMIN D) 50 MCG (2000 UT) CAPS Oral, Daily   clonazePAM (KLONOPIN) 0.5 mg, Oral, Daily at bedtime, Take 1 tablet  by mouth nightly   donepezil (ARICEPT) 10 mg, Oral, Daily at bedtime   fluticasone (FLONASE) 50 MCG/ACT nasal spray Each Nare, Daily   ketoconazole (NIZORAL) 2 % cream 1 application , Topical, Daily   ketoconazole (NIZORAL) 2 % shampoo 1 Application, Topical, Every other day   nitroGLYCERIN (NITROSTAT) 0.4 mg, Sublingual, Every 5 min PRN   Omega-3 Fatty Acids (FISH OIL) 1000 MG CAPS No dose, route, or frequency recorded.   Probiotic Product (PROBIOTIC DAILY PO) 37 mg, Oral, Daily   rosuvastatin (CRESTOR) 10 mg, Oral, Daily   sertraline (ZOLOFT) 25 mg, Oral, Daily at bedtime   triamcinolone lotion (KENALOG) 0.1 % 1 application , Topical, 2 times daily PRN       Objective:   Physical Exam BP 110/80    Pulse 76   Ht 5\' 2"  (1.575 m)   Wt 138 lb (62.6 kg)   SpO2 96%   BMI 25.24 kg/m  General:   Well developed, seems at baseline. HEENT:  Normocephalic . Face symmetric, atraumatic Lungs:  CTA B Normal respiratory effort, no intercostal retractions, no accessory muscle use. Heart: RRR,  no murmur.  Lower extremities: no pretibial edema bilaterally.  Femoral and pedal pulses present. Calves symmetric. No TTP at the trochanteric bursa's.  Hips are examined, exam is limited but range of motion seems okay without pain.  No TTP of the lumbar spine. Skin: No livedo reticularis today. Neurologic:  alert, pleasantly demented. Speech normal, gait assisted by a walker. Psych--  Behavior appropriate. No anxious or depressed appearing.      Assessment   Assessment HTN Hyperlipidemia CV: dr [place] --CAD, CABG 2003, stent 2008, cath 2015 [facility]), dc plavix 02-2015 --Aortic sclerosis Echo 2021 Neuro: -Parkinson disease - Dementia OSA  --- REM sleep d/o ---> CPAP. Clonazepam qhs prn [facility] --DJD --Severe kyphosis ---NCS 4-2017chronic radiucolopathy left C 6-7 and T1 and at L  4-5 and S1, saw neuro, rx conservative treatment  ---Osteopenia, nl vit D 2013, T score -2.0 (04-2016)  GERD, h/o PUD Vertigo CT head 2012 and 2015 nonacute. No help with antivert (03-2017)  PLAN Dementia, OSA, Parkinson:  LOV neurology 08/26/2022, the daughter reports gradual deterioration, he seems to be at baseline today. Back pain: Acute as described above, bilateral, no clear-cut radiculopathy symptoms, no known fall.  Is already better by using ibuprofen/Tylenol. For completeness we will get a x-ray, rule out occult fracture.  Stick with Tylenol only for now on. Call if improvement does not continue Livedo reticularis: The daughter has picture consistent with the diagnosis, vascular exam today is normal.  Rec observation for now. HTN: BP okay today, continue amlodipine.  Check a CBC. High cholesterol: On  Crestor, check cholesterol panel. RTC labs at his earliest convenience RTC checkup 4 months

## 2022-12-26 NOTE — Patient Instructions (Addendum)
Tylenol  500 mg OTC 2 tabs a day every 8 hours as needed for pain    GO TO THE FRONT DESK, PLEASE SCHEDULE YOUR APPOINTMENTS  Come back for blood work in the next few days, fasting, please make an appointment Come back for a checkup in 4 months   STOP BY THE FIRST FLOOR:  get the XR

## 2022-12-27 NOTE — Assessment & Plan Note (Signed)
Dementia, OSA, Parkinson:  LOV neurology 08/26/2022, the daughter reports gradual deterioration, he seems to be at baseline today. Back pain: Acute as described above, bilateral, no clear-cut radiculopathy symptoms, no known fall.  Is already better by using ibuprofen/Tylenol. For completeness we will get a x-ray, rule out occult fracture.  Stick with Tylenol only for now on. Call if improvement does not continue Livedo reticularis: The daughter has picture consistent with the diagnosis, vascular exam today is normal.  Rec observation for now. HTN: BP okay today, continue amlodipine.  Check a CBC. High cholesterol: On Crestor, check cholesterol panel. RTC labs at his earliest convenience RTC checkup 4 months

## 2023-01-06 ENCOUNTER — Telehealth: Payer: Self-pay | Admitting: Neurology

## 2023-01-06 DIAGNOSIS — N390 Urinary tract infection, site not specified: Secondary | ICD-10-CM

## 2023-01-06 NOTE — Telephone Encounter (Signed)
Pt daughter called stated pt needs a sooner appointment. Stated pt can't do a lot of this by himself like put his teeth in his mouth. Stated pt is doing a lot of ramble talking to myself. Stated something happen on 6/17 and he haven't been right since then.

## 2023-01-07 ENCOUNTER — Telehealth: Payer: Self-pay | Admitting: Internal Medicine

## 2023-01-07 DIAGNOSIS — M810 Age-related osteoporosis without current pathological fracture: Secondary | ICD-10-CM

## 2023-01-07 NOTE — Telephone Encounter (Signed)
Lumbar spine x-rays: Age-indeterminate vertebral fracture L1. Has history of osteopenia, last T-score -2.0 2017. Advised patient's daughter: Has osteoporosis based on history of fracture. Please call the patient's daughter: Recommend a DEXA, please arrange Recommend Prolia for treatment. Please be aware Prolia will not help with pain but will hopefully prevent future additional fractures which can be painful.

## 2023-01-07 NOTE — Telephone Encounter (Signed)
Spoke w/ Pt's daughter [name], she has agreed to do bone density test and start Prolia depending on cost. Telephone number of imaging sent to Mychart. Will run Prolia benefits once dexa results are back.

## 2023-01-07 NOTE — Telephone Encounter (Signed)
Called the daughter back. She states on 6/17 they had to wake up early because he had to go get dentures redone. He had a fiber 1 bar with medications and went to apt. They got back around 11 am and he woke up around 4:30 pm, went downstairs and was yelling at his wife. She asked what was wrong with him and in spanish he kept repeating, "I'm crazy". Daughter states around 5 pm she went to him and he did the same thing with her. That same night he was going up and down stairs with glasses in hand and when they asked him what he was looking for he said his glasses. She said that he has been doing that type of stuff more frequently. She states that he is having increasing sundowning symptoms.  He will go to bed 12 am-1:30 am and sleeping until 4-5 during the daytime and they are still having to make him get up. He would rather stay in bed.  She states he was independent with putting dentures in and now she has to help him with that. He bathes self but takes longer than use to. Just a very noticeable quick decline and they wanted to see if there is something that needs to be assessed or addressed.

## 2023-01-08 NOTE — Addendum Note (Signed)
Addended byConrad Quitman D on: 01/08/2023 09:23 AM   Modules accepted: Orders

## 2023-01-08 NOTE — Telephone Encounter (Signed)
I did see the patient 12/26/2022, he seemed to be at baseline. He is having some back pain and I wonder if that is playing a role on his symptoms.  He could have a UTI,from my side: --Please collect a UA urine culture DX UTI. ---If he gets worse, needs to be seen.

## 2023-01-08 NOTE — Telephone Encounter (Signed)
UA, urine culture orders placed.

## 2023-01-08 NOTE — Telephone Encounter (Signed)
Called the daughter and advised that Dr Vickey Huger reviewed the concerns that were noted and and states that the symptoms he is experiencing can be related to progression of parkinson's/dementia. She wanted to also involve Dr Drue Novel. Dr Drue Novel did note that pt was having back pain concerns and states that could potentially also check to make sure that he is not having a UTI. The daughter will contact the PCP about setting that up.  I discussed with her about potentially starting a medication nuplazid that helps with parkinsonism hallucinations which the daughter states he does have at times.  She will complete the UTI work up and let me know if they decide to move forward with nuplazid

## 2023-01-08 NOTE — Telephone Encounter (Signed)
Please  involve Dr Drue Novel as well-  Did the dentist give any meds? Sedatives , antibiotic, pain killers? Are the dentures less fitting - harder to put in? And could he have acquired an infection? UTI/ URI/ dental?   His sundowning fits  the progression of dementia with behavioral symptoms, and sinemet can make it sometimes worse. Klonopin does apparently not help?

## 2023-01-16 ENCOUNTER — Other Ambulatory Visit: Payer: Self-pay

## 2023-01-16 MED ORDER — AMLODIPINE BESYLATE 2.5 MG PO TABS: 2.5000 mg | ORAL_TABLET | Freq: Every day | ORAL | 1 refills | Status: DC

## 2023-01-20 ENCOUNTER — Telehealth: Payer: Self-pay | Admitting: *Deleted

## 2023-01-20 ENCOUNTER — Encounter: Payer: Self-pay | Admitting: Internal Medicine

## 2023-01-20 MED ORDER — DONEPEZIL HCL 10 MG PO TABS: 10.0000 mg | ORAL_TABLET | Freq: Every day | ORAL | 3 refills | Status: DC

## 2023-01-20 NOTE — Telephone Encounter (Signed)
Refill sent.

## 2023-01-22 ENCOUNTER — Other Ambulatory Visit (INDEPENDENT_AMBULATORY_CARE_PROVIDER_SITE_OTHER): Payer: Medicare Other

## 2023-01-22 ENCOUNTER — Ambulatory Visit (HOSPITAL_BASED_OUTPATIENT_CLINIC_OR_DEPARTMENT_OTHER)
Admission: RE | Admit: 2023-01-22 | Discharge: 2023-01-22 | Disposition: A | Discharge status: Home / Self Care | Payer: Medicare Other | Source: Ambulatory Visit | Attending: Internal Medicine | Admitting: Internal Medicine

## 2023-01-22 DIAGNOSIS — N390 Urinary tract infection, site not specified: Secondary | ICD-10-CM

## 2023-01-22 DIAGNOSIS — M810 Age-related osteoporosis without current pathological fracture: Secondary | ICD-10-CM | POA: Insufficient documentation

## 2023-01-22 DIAGNOSIS — I10 Essential (primary) hypertension: Secondary | ICD-10-CM | POA: Diagnosis not present

## 2023-01-22 DIAGNOSIS — E782 Mixed hyperlipidemia: Secondary | ICD-10-CM

## 2023-01-22 LAB — CBC WITH DIFFERENTIAL/PLATELET
Absolute Monocytes: 280 cells/uL (ref 200–950)
Basophils Absolute: 11 cells/uL (ref 0–200)
Basophils Relative: 0.3 %
Eosinophils Absolute: 140 cells/uL (ref 15–500)
Eosinophils Relative: 4 %
HCT: 42.5 % (ref 38.5–50.0)
Hemoglobin: 14.4 g/dL (ref 13.2–17.1)
Lymphs Abs: 753 cells/uL — ABNORMAL LOW (ref 850–3900)
MCH: 30.1 pg (ref 27.0–33.0)
MCHC: 33.9 g/dL (ref 32.0–36.0)
MCV: 88.7 fL (ref 80.0–100.0)
MPV: 10.7 fL (ref 7.5–12.5)
Monocytes Relative: 8 %
Neutro Abs: 2317 cells/uL (ref 1500–7800)
Neutrophils Relative %: 66.2 %
Platelets: 200 10*3/uL (ref 140–400)
RBC: 4.79 10*6/uL (ref 4.20–5.80)
RDW: 12.9 % (ref 11.0–15.0)
Total Lymphocyte: 21.5 %
WBC: 3.5 10*3/uL — ABNORMAL LOW (ref 3.8–10.8)

## 2023-01-22 NOTE — Addendum Note (Signed)
Addended by: Mervin Kung A on: 01/22/2023 03:51 PM   Modules accepted: Orders

## 2023-01-27 ENCOUNTER — Telehealth: Payer: Self-pay

## 2023-01-27 NOTE — Telephone Encounter (Signed)
Dexa has worsened. Pt now has Osteoporosis. Please check for benefits.

## 2023-01-27 NOTE — Telephone Encounter (Signed)
-----   Message from [facility] sent at 01/27/2023  5:51 AM EDT ----- Start Prolia, please arrange

## 2023-01-30 ENCOUNTER — Telehealth: Payer: Self-pay

## 2023-01-30 NOTE — Telephone Encounter (Signed)
Prolia VOB initiated via [email or web address] 

## 2023-01-30 NOTE — Telephone Encounter (Signed)
Created new encounter for Prolia BIV. Will route encounter back once benefit verification is complete.  

## 2023-02-02 ENCOUNTER — Other Ambulatory Visit (HOSPITAL_COMMUNITY): Payer: Self-pay

## 2023-02-02 NOTE — Telephone Encounter (Signed)
[employer] requires failure of BOTH an oral and IV bisphosphate for medical. No documentation of IV bisphosphate found on patient's chart. Please advise.    Ran test claim for Prolia, the co-pay is 298.79 with pharmacy benefit.

## 2023-02-02 NOTE — Telephone Encounter (Signed)
Has pt failed both IV and oral Bisphosphonate's?

## 2023-02-03 NOTE — Telephone Encounter (Signed)
The answer is no.  If he will not be approved for Prolia I recommend Reclast one  infusion yearly.

## 2023-02-09 ENCOUNTER — Telehealth: Payer: Self-pay | Admitting: Neurology

## 2023-02-09 NOTE — Telephone Encounter (Signed)
Daughter is asking for a call to discuss pt not being willing to get out of bed until about 5 if the afternoon.  Pt is having an increase in hallucinations(accusing spouse and daughter of doing things to him, and seeing men on the refrigerator) please call daughter to discuss.

## 2023-02-10 MED ORDER — REXULTI 0.5 MG PO TABS: 0.2500 mg | ORAL_TABLET | Freq: Every day | ORAL | 5 refills | Status: DC

## 2023-02-10 NOTE — Addendum Note (Signed)
Addended by: Judi Cong on: 02/10/2023 04:34 PM   Modules accepted: Orders

## 2023-02-10 NOTE — Telephone Encounter (Signed)
Called the daughter and she states he has been definitely having more hallucinations. Informed her of the medication rexulti that has shown to have some progress. Advised prescribed 0.5 mg to take once a day. Advised he should start by taking 0.25 mg initially to make sure that he tolerates the medication well. May increase the dose to full tablet after a week.  She was appreciative for the call and suggestion.

## 2023-02-13 ENCOUNTER — Other Ambulatory Visit: Payer: Self-pay | Admitting: Neurology

## 2023-02-13 NOTE — Telephone Encounter (Signed)
No PA required  Ref #: [id number]

## 2023-02-18 NOTE — Telephone Encounter (Signed)
Rx refilled per last office visit note.

## 2023-03-03 ENCOUNTER — Encounter: Payer: Self-pay | Admitting: Neurology

## 2023-03-03 ENCOUNTER — Ambulatory Visit (INDEPENDENT_AMBULATORY_CARE_PROVIDER_SITE_OTHER): Payer: Medicare Other | Admitting: Neurology

## 2023-03-03 VITALS — BP 149/81 | HR 74 | Ht 65.0 in | Wt 141.0 lb

## 2023-03-03 DIAGNOSIS — F02B11 Dementia in other diseases classified elsewhere, moderate, with agitation: Secondary | ICD-10-CM

## 2023-03-03 DIAGNOSIS — G3183 Dementia with Lewy bodies: Secondary | ICD-10-CM | POA: Diagnosis not present

## 2023-03-03 MED ORDER — BREXPIPRAZOLE 1 MG PO TABS: 1.0000 mg | ORAL_TABLET | Freq: Every day | ORAL | 5 refills | Status: DC

## 2023-03-03 MED ORDER — CLONAZEPAM 0.5 MG PO TABS: 0.5000 mg | ORAL_TABLET | Freq: Every day | ORAL | 1 refills | Status: DC

## 2023-03-03 MED ORDER — REXULTI 0.5 MG PO TABS: 1.0000 mg | ORAL_TABLET | Freq: Every day | ORAL | 5 refills | Status: DC

## 2023-03-03 NOTE — Progress Notes (Signed)
Provider:  Melvyn Novas, MD  Primary Care Physician:  [name], MD [address] [place] [address]     Referring Provider: [name], Md [address] [place],  [place] [address]          Chief Complaint according to patient   Patient presents with:     New Patient (Initial Visit)           HISTORY OF PRESENT ILLNESS:  [name] is a 88 y.o. male patient who is here for revisit 03/03/2023 for REM BD, PD and related dementia. Has hypersomnia and is resisting getting out of the bed - worse month by month . Chief concern according to patient :  daughter asked for this visit due to recent changes- This long established patient has responded to Rexulti -but it is very expensive. He has been taking ZOLOFT, at low dose - question if he gained any benefit.   He likes to spend many hours in bed or watching TV and is angry when his family insists on him getting up. Hard to live with, argumentative.  Recently he had a day of confusion, had difficulties with sleep arousals - confusion, accidentally took possibly more medication that he should , duplication of doses. He was aggressive and couldn't orient himself, stating repeatedly " I am crazy" , he couldn't get his dentures in, and he drifted off in his TV armchair, woke confused. He believed that the TV program discussed a friend of his daughter's from high school.  Good days and bad days- but more bad days.     [name] is a 88 y.o. male patient who is here for revisit 08/26/2022 for  Dementia with Parkinson's disease and Sleep apnea.  Chief concern according to patient :  Depressed, loss of autonomy, feels physically Ok but suffers from his awareness of his cognitive decline. Uses CPAP compliantly, daughter helps him a lot.  His closest friend died 2 months ago, and he grieves. He has little social interaction a outside the family. He also lost interest. He lives with his  wife, he had a recent audiology test and had an eye exam. He reverts more often to spanish in conversation, sometimes mid conversation, sometimes in phone conversation- with non-spanish speakers. [name] He came to the [place] at age 60, 34 years ago on [place].    1) His CPAP compliance has been very good 97% by days and most days far above 4 hours he averages 8 hours 16 minutes set at 11 cm water pressure with 3 cm expiratory relief.  He reached an amazing residual AHI of only 1.8/h yes he does have high air leakage but it seems not to affect the apnea control.  So based on this the only suggestion I had in addition was to use a chinstrap which she does not like I will keep him with his current set up in spite of the high air leak.   #2 is his excessive daytime sleepiness and as said before he also may have reasons for excessive daytime sleepiness based on his neurodegenerative disease and somewhat with depression and fatigue that goes I have assessed.  The fatigue severity score is often elevated in patients with depression and is a 62 out of 63 points.  At the geriatric depression score is clearly positive.  13 out of 15 points.  3) PD mildly progressing affecting ability to close p zippers, buttons, and getting dressed and undressed, gave up driving but his wife is no longer driving either.    4) Dementia. Progressive with paranoid and depressive tendencies, behaviour. 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    Review of Systems: Out of a complete 14 system review, the patient complains of only the following symptoms, and all other reviewed systems are negative.:   REM BD ,years before he manifested parkinsonism.  Fatigue, sleepiness , fragmented sleep, Insomnia, RLS, Nocturia.   How likely are you to doze in the following situations: 0 = not likely, 1 = slight chance, 2 = moderate chance, 3 = high chance   Sitting and Reading? Watching Television? Sitting inactive in a public  place (theater or meeting)? As a passenger in a car for an hour without a break? Lying down in the afternoon when circumstances permit? Sitting and talking to someone? Sitting quietly after lunch without alcohol? In a car, while stopped for a few minutes in traffic?  not performed today   Social History   Socioeconomic History   Marital status: Married    Spouse name: Teacher, music   Number of children: 2   Years of education: Bachelors   Highest education level: Master's degree (e.g., MA, MS, MEng, MEd, MSW, MBA)  Occupational History   Occupation: retired     Associate Professor: RETIRED  Tobacco Use   Smoking status: Former    Current packs/day: 0.00    Types: Cigarettes    Quit date: 02/25/1979    Years since quitting: 44.0   Smokeless tobacco: Never   Tobacco comments:    smoked 1955-1980, up to 1 ppd  Substance and Sexual Activity   Alcohol use: Yes    Comment: 1/2 cup / day of wine or 1 can beer/day   Drug use: No   Sexual activity: Never  Other Topics Concern   Not on file  Social History Narrative   Patient is married (Texanna) and lives at home with his wife and daughter [name] .   Patient has two adult children- daughters x 2  ([name], [place])   Patient is retired.   Patient has a Bachelor's degree.   Patient is right-handed.   Patient drinks 1/2 liter of green tea daily.   Walks once week for exercise   Born in [place]    Social Determinants of Health   Financial Resource Strain: Low Risk  (08/04/2022)   Overall Financial Resource Strain (CARDIA)    Difficulty of Paying Living Expenses: Not very hard  Food Insecurity: No Food Insecurity (08/04/2022)   Hunger Vital Sign    Worried About Running Out of Food in the Last Year: Never true    Ran Out of Food in the Last Year: Never true  Transportation Needs: No Transportation Needs (08/04/2022)   PRAPARE - Administrator, Civil Service (Medical): No    Lack of Transportation (Non-Medical): No  Physical  Activity: Inactive (08/04/2022)   Exercise Vital Sign    Days of Exercise per Week: 0 days    Minutes of Exercise per Session: 0 min  Stress: Stress Concern Present (08/04/2022)   [employer] of Occupational Health - Occupational Stress Questionnaire    Feeling of Stress : Very much  Social Connections: Unknown (08/04/2022)   Social Connection and Isolation Panel [NHANES]    Frequency of Communication with Friends and Family: Once a week    Frequency  of Social Gatherings with Friends and Family: Once a week    Attends Religious Services: Not on Marketing executive or Organizations: No    Attends Banker Meetings: Never    Marital Status: Married    Family History  Problem Relation Age of Onset   Cancer Father        ? primary   Uterine cancer Daughter    Hypertension Maternal Grandmother    Peripheral vascular disease Maternal Grandmother    Osteoporosis Sister    Diabetes Sister    Stroke Neg Hx    Heart attack Neg Hx    Colon cancer Neg Hx    Prostate cancer Neg Hx     Past Medical History:  Diagnosis Date   Anginal pain (HCC)    Arthritis    BACK & SHOULDERS   CAD S/P percutaneous coronary angioplasty 2003   Dr Antoine Poche: 2003: a) s/p CABG (LIMA-LAD, SVG-Diag);b) 2008: prior PCI to native RCA; c) 12/12/2013: SVG-Diag occluded, patent LIMA, 95% post stent in RCA --> PCI  with 2.75 x 18 mm Xience Alpine DES, residual dRCA-PLA 80=-90%, EF 55-65%   Diverticulosis    GERD (gastroesophageal reflux disease)    Gilbert's syndrome    Heart murmur    Hyperlipidemia    Hypertension    Osteopenia 2010    T score -1.9 @ hip (femoral neck)   PUD (peptic ulcer disease)    PMH , ? 1995   Short-term memory loss    Shortness of breath     Past Surgical History:  Procedure Laterality Date   CERVICAL FUSION  1997   Dr Jeral Fruit   CHOLECYSTECTOMY  2004   CORONARY ARTERY BYPASS GRAFT  2003   Last catheterization was in August 2008 demonstrated a LIMA to  th LAD which was patent, there was an atretic saphenous vein graft to the diagonal, the LAD had a 90% stenosis in the large calcified segment, the diagonal has ostial 70% stenosis, the circumflex had a ramus intermediate with ostial 25% stenosis, the right coronary artery was dominant.  There was an ulcerated 80%-90% stenosis.     CORONARY STENT PLACEMENT  2008   drug-eluting stent to right coronary artery   CORONARY STENT PLACEMENT Right 12/12/2013   RT CORONARY  DES       DR COOPER    HEMORRHOID SURGERY     LEFT HEART CATHETERIZATION WITH CORONARY/GRAFT ANGIOGRAM N/A 12/12/2013   Procedure: LEFT HEART CATHETERIZATION WITH Isabel Caprice;  Surgeon: Micheline Chapman, MD;  Location: [facility] CATH LAB;  Service: Cardiovascular;  Laterality: N/A;   TONSILLECTOMY AND ADENOIDECTOMY     UPPER GASTROINTESTINAL ENDOSCOPY      Dr Victorino Dike     Current Outpatient Medications on File Prior to Visit  Medication Sig Dispense Refill   amLODipine (NORVASC) 2.5 MG tablet Take 1 tablet (2.5 mg total) by mouth daily. 90 tablet 1   aspirin EC 81 MG tablet Take 81 mg by mouth daily.     Brexpiprazole (REXULTI) 0.5 MG TABS Take 0.5-1 tablets (0.25-0.5 mg total) by mouth daily. 30 tablet 5   carbidopa-levodopa (SINEMET IR) 25-100 MG tablet Take 1 tablet by mouth 4 (four) times daily. 360 tablet 3   cetirizine (ZYRTEC) 10 MG tablet Take 10 mg by mouth daily.     Cholecalciferol (VITAMIN D) 50 MCG (2000 UT) CAPS Take by mouth daily.     clonazePAM (KLONOPIN) 0.5 MG tablet Take 1  tablet (0.5 mg total) by mouth at bedtime. Take 1 tablet by mouth nightly 90 tablet 1   donepezil (ARICEPT) 10 MG tablet Take 1 tablet (10 mg total) by mouth at bedtime. 90 tablet 3   fluticasone (FLONASE) 50 MCG/ACT nasal spray Place into both nostrils daily.     ketoconazole (NIZORAL) 2 % cream Apply 1 application. topically daily. 90 g 0   ketoconazole (NIZORAL) 2 % shampoo Apply 1 Application topically every other day.      nitroGLYCERIN (NITROSTAT) 0.4 MG SL tablet Place 1 tablet (0.4 mg total) under the tongue every 5 (five) minutes as needed for chest pain. 25 tablet 5   Omega-3 Fatty Acids (FISH OIL) 1000 MG CAPS      Probiotic Product (PROBIOTIC DAILY PO) Take 37 mg by mouth daily.     rosuvastatin (CRESTOR) 10 MG tablet TAKE 1 TABLET BY MOUTH DAILY 90 tablet 0   sertraline (ZOLOFT) 25 MG tablet TAKE 1 TABLET BY MOUTH AT BEDTIME 90 tablet 0   triamcinolone lotion (KENALOG) 0.1 % Apply 1 application topically 2 (two) times daily as needed (Rash at the back). 120 mL 3   No current facility-administered medications on file prior to visit.    Allergies  Allergen Reactions   Codeine Nausea Only     DIAGNOSTIC DATA (LABS, IMAGING, TESTING) - I reviewed patient records, labs, notes, testing and imaging myself where available.  Lab Results  Component Value Date   WBC 3.5 (L) 01/22/2023   HGB 14.4 01/22/2023   HCT 42.5 01/22/2023   MCV 88.7 01/22/2023   PLT 200 01/22/2023      Component Value Date/Time   NA 137 08/26/2022 1430   K 4.5 08/26/2022 1430   CL 99 08/26/2022 1430   CO2 30 08/26/2022 1430   GLUCOSE 95 08/26/2022 1430   BUN 13 08/26/2022 1430   CREATININE 0.90 08/26/2022 1430   CREATININE 0.83 04/12/2021 1555   CALCIUM 9.9 08/26/2022 1430   PROT 7.8 10/14/2021 1514   ALBUMIN 4.9 10/14/2021 1514   AST 14 08/26/2022 1430   ALT 5 08/26/2022 1430   ALKPHOS 80 10/14/2021 1514   BILITOT 1.1 10/14/2021 1514   GFRNONAA 89 (L) 01/15/2014 1230   GFRAA >90 01/15/2014 1230   Lab Results  Component Value Date   CHOL 134 01/22/2023   HDL 53 01/22/2023   LDLCALC 62 01/22/2023   TRIG 111 01/22/2023   CHOLHDL 2.5 01/22/2023   Lab Results  Component Value Date   HGBA1C 5.5 10/14/2021   Lab Results  Component Value Date   VITAMINB12 358 09/18/2015   Lab Results  Component Value Date   TSH 1.33 10/14/2021    PHYSICAL EXAM:  Today's Vitals   03/03/23 1543  BP: (!) 149/81  Pulse:  74  Weight: 141 lb (64 kg)  Height: 5\' 5"  (1.651 m)   Body mass index is 23.46 kg/m.   Wt Readings from Last 3 Encounters:  03/03/23 141 lb (64 kg)  12/26/22 138 lb (62.6 kg)  08/26/22 141 lb (64 kg)     Ht Readings from Last 3 Encounters:  03/03/23 5\' 5"  (1.651 m)  12/26/22 5\' 2"  (1.575 m)  08/26/22 5\' 2"  (1.575 m)      General: The patient is drowsy, delayed , His posture is stooped. He is hoarse , masked face.  Tremor. Hypersalivation, drooling.  The patient is well groomed.  Head: Normocephalic, atraumatic. Neck is supple. Mallampati 2, lower palate, neck circumference: 14.5"  open nasal airflow present;. No delayed swallowing,  retrognathia.  No teeth - al dentures.  Cardiovascular:  Regular rate and rhythm, without murmurs or carotid bruit, and without distended neck veins. Respiratory: Lungs are clear to auscultation. Skin:  Without evidence of edema, or rash Trunk: patient has a stooped  posture , developed a hump. He is a litle faster today than he was last visit.  Neurologic exam :The patient is drowsy, but oriented to place and time.    Memory subjective described as " my attention is impaired, forgertfulness" . He appears very fatigued. He reports strange people have moved into the home, living on top of the refrigerator.    There is a delay in answering the questionnaires- he speaks almost only spanish now. [name] KitchenSpeech output is sparse , there is  hoarseness, dysphonia.  Mood and affect are less depressed, a little worried.  Almost no facial expression. He worries a lot, gets fixated. Lips tremble as he speaks, low volume voice-    Cranial nerves: denies loss of smell or taste ! Pupils are equal and briskly reactive to light.   Rare blink. Bilateral ptosis. Masked face. Hoarse voice.    Hearing impaired - but no tinnitus. Air conduction is better on the right, bone conduction is louder on the left.  He needs to be checked for wax impaction and may want to use an  amplifier, a primitive form of a hearing aid.   Facial sensation intact to fine touch. Facial motor strength is symmetric and tongue and uvula move midline. Motor exam:  Rigor Elevated tone.  He has good grip strength,  he has a right hand tremor before he begins acting out dreams.  Sensory: He has some numbness in the right arm, pinky and ring finger  Coordination:Finger-to-nose maneuver is today much less affected by tremor but is very slowed.    This is parkinsonism.  Tremor in right over left- both hands, rigor over both biceps.  Cogwheeling over the biceps. Rigor in wrist and neck    Gait " posture is stooped, severely - very unsteady when initiating to rise.    Deep tendon reflexes: in the upper and lower extremities are symmetric, Babinski deferred.     03/03/2023    3:47 PM 08/26/2022    3:29 PM 08/07/2022    3:52 PM 02/20/2022    2:55 PM 08/01/2021    3:51 PM 02/06/2021    2:28 PM 11/06/2020   11:48 AM  MMSE - Mini Mental State Exam  Not completed:   Unable to complete      Orientation to time 1 3  4 4 4 5   Orientation to Place 3 2  5 5 5 5   Registration 3 3  3 2 3 3   Attention/ Calculation 3 1  1 1 1 4   Recall 1 2  3 2 2 3   Language- name 2 objects 1 2  2 2 2 2   Language- repeat 1 1  1  0 0 1  Language- follow 3 step command 3 2  3 3 3 3   Language- read & follow direction 1 1  1 1 1 1   Write a sentence 1 1  1 1  0 1  Copy design 0 0  0 0 1 0  Total score 18 18  24 21 22 28         01/10/2016   10:03 AM 01/10/2016    9:55 AM 05/30/2015    4:13 PM 05/30/2015  3:59 PM  Montreal Cognitive Assessment   Visuospatial/ Executive (0/5) 4 4 3 5   Naming (0/3) 3 3 3 3   Attention: Read list of digits (0/2) 2 2 2 2   Attention: Read list of letters (0/1) 1 1 1 1   Attention: Serial 7 subtraction starting at 100 (0/3) 3 3 3 3   Language: Repeat phrase (0/2) 0 0 1 2  Language : Fluency (0/1) 0 0 0 1  Abstraction (0/2) 2 2 1 2   Delayed Recall (0/5) 2 2 3 5   Orientation (0/6) 6 6 6 6    Total 23 23 23 30   Adjusted Score (based on education)  23  30     ASSESSMENT AND PLAN 88 y.o. year old male  here with:    1) Lewy body or PD dementia, history of REM BD , now visual hallucinations , no auditory hallucinations.  Has had significant physical and cognitive decline- now in moderate severe dementia.   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 Try Linola or shea butter .  Plan : Starting Rixulti last months may not have been long enough to see effect on hallucinations. 0.5 mg can be increased to 1 mg.  Zoloft at 25 mg not working either.  Continue carbidopa / levodopa/ Klonopin .    I plan to follow up either personally or through our NP within 6-7 months.   I would like to thank [name], MD or allowing me to meet with and to take care of this pleasant patient.    After spending a total time of  39  minutes face to face and additional time for physical and neurologic examination, review of laboratory studies,  personal review of imaging studies, reports and results of other testing and review of referral information / records as far as provided in visit,   Electronically signed by: Melvyn Novas, MD 03/03/2023 4:08 PM  [facility] Board certified by The [employer] of Sleep Medicine and Diplomate of the [employer] of Sleep Medicine. Board certified In Neurology through the ABPN, Fellow of the [employer] of Neu[employer].

## 2023-03-18 ENCOUNTER — Telehealth: Payer: Self-pay | Admitting: Family Medicine

## 2023-03-18 DIAGNOSIS — F03B0 Unspecified dementia, moderate, without behavioral disturbance, psychotic disturbance, mood disturbance, and anxiety: Secondary | ICD-10-CM

## 2023-03-18 NOTE — Telephone Encounter (Signed)
I saw the patient's wife Sherrill Sink and his daughter in the clinic today.  Ryson is a patient of my partner Dr. Drue Novel.  Patient's wife and daughter are tearful today.  They note Mavis is getting dramatically worse from a dementia standpoint.  He is not able to get out of bed, feed or bathe self, he is often upset and anxious.  They are not able to manage his physical and mental status needs on their own He is suffering from hallucinations. Per most recent neurology note, patient has history of Lewy body or PD dementia with visual hallucinations, dementia now moderate to severe.  Most recent neurology visit was in mid September, patient family notes significant decline even since then  We discussed getting a hospice consultation and after some discussion patient's wife and daughter are in agreement  Patient's primary care doctor, Dr. Drue Novel is out of town right now.  I touched base with his neurologist who agreed a hospice consultation would be appropriate.  I will set this in motion

## 2023-03-20 ENCOUNTER — Telehealth: Payer: Self-pay | Admitting: Internal Medicine

## 2023-03-20 NOTE — Telephone Encounter (Signed)
Kristine from [facility] called to request for the pt's last two physician notes. Their fax number [phone].

## 2023-03-20 NOTE — Telephone Encounter (Signed)
OV notes faxed  

## 2023-03-23 ENCOUNTER — Other Ambulatory Visit: Payer: Self-pay | Admitting: Cardiology

## 2023-03-31 ENCOUNTER — Telehealth: Payer: Self-pay | Admitting: Internal Medicine

## 2023-03-31 ENCOUNTER — Encounter: Payer: Self-pay | Admitting: Neurology

## 2023-03-31 NOTE — Telephone Encounter (Signed)
Agree, thank you.  See phone note from Dr. Patsy Lager 03/18/2023.

## 2023-03-31 NOTE — Telephone Encounter (Signed)
Adrianna from [facility] called to let pcp know that they spoke with the pt's daughter, [name], and she agreed for her Father to receive palliative care. His appt is scheduled for on 11/22 at 2 pm.

## 2023-03-31 NOTE — Telephone Encounter (Signed)
FYI

## 2023-04-28 ENCOUNTER — Encounter: Payer: Self-pay | Admitting: Internal Medicine

## 2023-04-28 ENCOUNTER — Ambulatory Visit: Payer: Medicare Other | Admitting: Internal Medicine

## 2023-04-28 VITALS — BP 122/60 | HR 87 | Temp 97.6°F | Resp 16 | Ht 65.0 in | Wt 136.5 lb

## 2023-04-28 DIAGNOSIS — I10 Essential (primary) hypertension: Secondary | ICD-10-CM | POA: Diagnosis not present

## 2023-04-28 DIAGNOSIS — G20A1 Parkinson's disease without dyskinesia, without mention of fluctuations: Secondary | ICD-10-CM

## 2023-04-28 DIAGNOSIS — F03B0 Unspecified dementia, moderate, without behavioral disturbance, psychotic disturbance, mood disturbance, and anxiety: Secondary | ICD-10-CM

## 2023-04-28 DIAGNOSIS — Z23 Encounter for immunization: Secondary | ICD-10-CM | POA: Diagnosis not present

## 2023-04-28 MED ORDER — TRIAMCINOLONE ACETONIDE 0.1 % EX LOTN: 1.0000 | TOPICAL_LOTION | Freq: Two times a day (BID) | CUTANEOUS | 3 refills | Status: DC | PRN

## 2023-04-28 MED ORDER — SERTRALINE HCL 25 MG PO TABS: ORAL_TABLET | ORAL | 1 refills | Status: DC

## 2023-04-28 MED ORDER — KETOCONAZOLE 2 % EX SHAM: 1.0000 | MEDICATED_SHAMPOO | CUTANEOUS | 5 refills | Status: DC

## 2023-04-28 NOTE — Patient Instructions (Addendum)
Increase sertraline 25 mg: 2 tablets a day for 10 days Then 3 tablets daily.  Other medications the same  He will need consistent supervision     GO TO THE LAB : Get the blood work     Next visit with me in 4 months. Please schedule it at the front desk      Vaccines I recommend: Covid booster

## 2023-04-28 NOTE — Progress Notes (Unsigned)
Subjective:    Patient ID: [name], male    DOB: [birth year], 88 y.o.   MRN: [id number]  DOS:  04/28/2023 Type of visit - description: f/u, here with his daughter [name]  History is obtained from the daughter. The patient continued with gradual deterioration of his mental status in general. Had at least a couple of falls since the last visit, they were do not witness it, injured his elbows, no known LOC. Has not complained of pain. They have not noticed any fever or chills. Has not complained of any urinary symptoms No nausea vomiting  The main problem is behavioral, gets very anxious, continue hallucinating.  Wandered X 1 since last visit. He is not aggressive but rather refuses help and is stubborn   Review of Systems See above   Past Medical History:  Diagnosis Date   Anginal pain (HCC)    Arthritis    BACK & SHOULDERS   CAD S/P percutaneous coronary angioplasty 2003   Dr Antoine Poche: 2003: a) s/p CABG (LIMA-LAD, SVG-Diag);b) 2008: prior PCI to native RCA; c) 12/12/2013: SVG-Diag occluded, patent LIMA, 95% post stent in RCA --> PCI  with 2.75 x 18 mm Xience Alpine DES, residual dRCA-PLA 80=-90%, EF 55-65%   Diverticulosis    GERD (gastroesophageal reflux disease)    Gilbert's syndrome    Heart murmur    Hyperlipidemia    Hypertension    Osteopenia 2010    T score -1.9 @ hip (femoral neck)   PUD (peptic ulcer disease)    PMH , ? 1995   Short-term memory loss    Shortness of breath     Past Surgical History:  Procedure Laterality Date   CERVICAL FUSION  1997   Dr Jeral Fruit   CHOLECYSTECTOMY  2004   CORONARY ARTERY BYPASS GRAFT  2003   Last catheterization was in August 2008 demonstrated a LIMA to th LAD which was patent, there was an atretic saphenous vein graft to the diagonal, the LAD had a 90% stenosis in the large calcified segment, the diagonal has ostial 70% stenosis, the circumflex had a ramus intermediate with ostial 25% stenosis, the right coronary  artery was dominant.  There was an ulcerated 80%-90% stenosis.     CORONARY STENT PLACEMENT  2008   drug-eluting stent to right coronary artery   CORONARY STENT PLACEMENT Right 12/12/2013   RT CORONARY  DES       DR COOPER    HEMORRHOID SURGERY     LEFT HEART CATHETERIZATION WITH CORONARY/GRAFT ANGIOGRAM N/A 12/12/2013   Procedure: LEFT HEART CATHETERIZATION WITH Isabel Caprice;  Surgeon: Micheline Chapman, MD;  Location: [facility] CATH LAB;  Service: Cardiovascular;  Laterality: N/A;   TONSILLECTOMY AND ADENOIDECTOMY     UPPER GASTROINTESTINAL ENDOSCOPY      Dr Victorino Dike    Current Outpatient Medications  Medication Instructions   amLODipine (NORVASC) 2.5 mg, Oral, Daily   aspirin EC 81 mg, Daily   brexpiprazole (REXULTI) 1 mg, Oral, Daily   carbidopa-levodopa (SINEMET IR) 25-100 MG tablet 1 tablet, Oral, 4 times daily   cetirizine (ZYRTEC) 10 mg, Oral, Daily   Cholecalciferol (VITAMIN D) 50 MCG (2000 UT) CAPS Oral, Daily   clonazePAM (KLONOPIN) 0.5 mg, Oral, Daily at bedtime, Take 1 tablet by mouth nightly   diphenhydrAMINE (BENADRYL) 25 mg, Oral, Every 6 hours PRN   donepezil (ARICEPT) 10 mg, Oral, Daily at bedtime   fluticasone (FLONASE) 50 MCG/ACT nasal spray Each Nare, Daily   ketoconazole (NIZORAL)  2 % cream 1 application , Topical, Daily   ketoconazole (NIZORAL) 2 % shampoo 1 Application, Topical, Every other day   Melatonin ([employer] MELATONIN) 12 MG TBDP    nitroGLYCERIN (NITROSTAT) 0.4 mg, Sublingual, Every 5 min PRN   Omega-3 Fatty Acids (FISH OIL) 1000 MG CAPS No dose, route, or frequency recorded.   Probiotic Product (PROBIOTIC DAILY PO) 37 mg, Oral, Daily   rosuvastatin (CRESTOR) 10 mg, Oral, Daily   sertraline (ZOLOFT) 25 mg, Oral, Daily at bedtime   triamcinolone lotion (KENALOG) 0.1 % 1 application , Topical, 2 times daily PRN       Objective:   Physical Exam BP 122/60   Pulse 87   Temp 97.6 F (36.4 C) (Oral)   Resp 16   Ht 5\' 5"  (1.651 m)   Wt 136 lb 8 oz  (61.9 kg)   SpO2 97%   BMI 22.71 kg/m  General:   Well developed, elderly, frail male.  Uses a walker. HEENT:  Normocephalic . Face symmetric, atraumatic Lungs:  CTA B Normal respiratory effort, no intercostal retractions, no accessory muscle use. Heart: RRR, + systolic murmur.  Lower extremities: no pretibial edema bilaterally  Skin: Not pale. Not jaundice Neurologic:  alert, follow commands, speech: Whispers, gait assisted by walker. Psych--    Behavior appropriate. No anxious or depressed appearing.      Assessment     Assessment HTN Hyperlipidemia CV: dr [place] --CAD, CABG 2003, stent 2008, cath 2015 [facility]), dc plavix 02-2015 --Aortic sclerosis Echo 2021 Neuro: -Parkinson disease - Dementia OSA  --- REM sleep d/o ---> CPAP. Clonazepam qhs prn MSK --DJD --Severe kyphosis ---NCS 4-2017chronic radiucolopathy left C 6-7 and T1 and at L  4-5 and S1, saw neuro, rx conservative treatment  ---Osteopenia, nl vit D 2013, T score -2.0 (04-2016)  GERD, h/o PUD Vertigo CT head 2012 and 2015 nonacute. No help with antivert (03-2017)  PLAN BMP CBC TSH Dementia, OSA, Parkinson: With behavioral issues as described above including anxiety and occasional difficulty sleeping. Deterioration has been gradual.  Currently on the following medications: Sinemet, clonazepam 1 tablet at night, Aricept 10 mg daily, sertraline 25 mg daily, Rexulti added on 03/03/2023 by neurology.Unclear is Rexulti has helped much.  Also Benadryl which he takes as needed for insomnia The patient lives at home with his wife the daughter is essentially 99% of the time with them. Emotional support provided to the daughter who obviously is taking most of the burden taking care of [name]. Plan: Increase sertraline gradually to 75 mg to help with anxiety.  To have the first palliative care visit in about 10 days.  Needs close supervision.  Check TSH. HTN: Check BMP and CBC. Preventive care: Flu shot today,  encouraged COVID-vaccine. RTC 4 months  LOV neurology 12/2022, 7-12 Dementia, OSA, Parkinson:  LOV neurology 08/26/2022, the daughter reports gradual deterioration, he seems to be at baseline today. Back pain: Acute as described above, bilateral, no clear-cut radiculopathy symptoms, no known fall.  Is already better by using ibuprofen/Tylenol. For completeness we will get a x-ray, rule out occult fracture.  Stick with Tylenol only for now on. Call if improvement does not continue Livedo reticularis: The daughter has picture consistent with the diagnosis, vascular exam today is normal.  Rec observation for now. HTN: BP okay today, continue amlodipine.  Check a CBC. High cholesterol: On Crestor, check cholesterol panel. RTC labs at his earliest convenience RTC checkup 4 months

## 2023-04-29 LAB — CBC WITH DIFFERENTIAL/PLATELET
Basophils Absolute: 0 10*3/uL (ref 0.0–0.1)
Basophils Relative: 0.8 % (ref 0.0–3.0)
Eosinophils Absolute: 0.1 10*3/uL (ref 0.0–0.7)
Eosinophils Relative: 3.2 % (ref 0.0–5.0)
HCT: 42.3 % (ref 39.0–52.0)
Hemoglobin: 14.4 g/dL (ref 13.0–17.0)
Lymphocytes Relative: 20.2 % (ref 12.0–46.0)
Lymphs Abs: 0.7 10*3/uL (ref 0.7–4.0)
MCHC: 34 g/dL (ref 30.0–36.0)
MCV: 89.9 fL (ref 78.0–100.0)
Monocytes Absolute: 0.3 10*3/uL (ref 0.1–1.0)
Monocytes Relative: 8 % (ref 3.0–12.0)
Neutro Abs: 2.2 10*3/uL (ref 1.4–7.7)
Neutrophils Relative %: 67.8 % (ref 43.0–77.0)
Platelets: 198 10*3/uL (ref 150.0–400.0)
RBC: 4.7 Mil/uL (ref 4.22–5.81)
RDW: 13.6 % (ref 11.5–15.5)
WBC: 3.3 10*3/uL — ABNORMAL LOW (ref 4.0–10.5)

## 2023-04-29 LAB — BASIC METABOLIC PANEL
BUN: 15 mg/dL (ref 6–23)
CO2: 28 meq/L (ref 19–32)
Calcium: 9.8 mg/dL (ref 8.4–10.5)
Chloride: 103 meq/L (ref 96–112)
Creatinine, Ser: 0.74 mg/dL (ref 0.40–1.50)
GFR: 82.36 mL/min (ref >60.00)
Glucose, Bld: 99 mg/dL (ref 70–99)
Potassium: 4.2 meq/L (ref 3.5–5.1)
Sodium: 139 meq/L (ref 135–145)

## 2023-04-29 LAB — TSH: TSH: 1.19 u[IU]/mL (ref 0.35–5.50)

## 2023-04-29 NOTE — Assessment & Plan Note (Signed)
Dementia, OSA, Parkinson: With behavioral issues as described above including anxiety and occasional difficulty sleeping. Deterioration has been gradual.  Current meds: Sinemet, clonazepam 1 tablet at night, Aricept 10 mg daily, sertraline 25 mg daily, Rexulti added on 03/03/2023 by neurology.Unclear if Rexulti has helped much.  Also Benadryl which he takes as needed for insomnia The patient lives at home with his wife the daughter is essentially 99% of the time with them. Plan:  Increase sertraline gradually to 75 mg to help with anxiety.   To have the first palliative care visit in about 10 days.   Needs close supervision.   Check TSH. Caregiver fatigue: Emotional support provided to the daughter who obviously  carries most of the burden of taking care of Belle. HTN: Check BMP and CBC. Preventive care: Flu shot today, encouraged COVID-vaccine. RTC 4 months

## 2023-05-18 ENCOUNTER — Telehealth: Payer: Self-pay | Admitting: Neurology

## 2023-05-18 ENCOUNTER — Telehealth: Payer: Self-pay | Admitting: Internal Medicine

## 2023-05-18 NOTE — Telephone Encounter (Signed)
Pt's daughter has called to report that pt has hit the doughnut whole with insurance for the month of December and that the brexpiprazole (REXULTI) 1 MG TABS tablet  would cost pt $327.00 Daughter states insurance company made suggestion she call to see if there are samples available here at office, and if not she will then reach out to the drug company for any kind of assistance since pt will be out of the medication within the next few days, please call.

## 2023-05-18 NOTE — Telephone Encounter (Signed)
Okay for hospice doctor to take over.

## 2023-05-18 NOTE — Telephone Encounter (Signed)
Carrie from [facility] called and stated that the patient is currently being seen under palliative services. His daughter is interested in hospice for the patient. They want to know if under hospice, will Dr. Drue Novel still remain his PCP? Please call and advise at [phone].

## 2023-05-18 NOTE — Telephone Encounter (Signed)
Spoke w/ Lyla Son- informed PCP will defer to hospice provider.

## 2023-05-18 NOTE — Telephone Encounter (Signed)
Please advise 

## 2023-05-19 MED ORDER — BREXPIPRAZOLE 1 MG PO TABS: 0.5000 mg | ORAL_TABLET | Freq: Every day | ORAL | Status: DC

## 2023-05-19 NOTE — Telephone Encounter (Signed)
Called the daughter back and advised that there didn't seem to be much patient assistance for this. I was able to provide samples for the patient

## 2023-05-25 ENCOUNTER — Telehealth: Payer: Self-pay | Admitting: Internal Medicine

## 2023-05-25 DIAGNOSIS — F03B0 Unspecified dementia, moderate, without behavioral disturbance, psychotic disturbance, mood disturbance, and anxiety: Secondary | ICD-10-CM

## 2023-05-25 DIAGNOSIS — R269 Unspecified abnormalities of gait and mobility: Secondary | ICD-10-CM

## 2023-05-25 DIAGNOSIS — G20A1 Parkinson's disease without dyskinesia, without mention of fluctuations: Secondary | ICD-10-CM

## 2023-05-25 NOTE — Telephone Encounter (Signed)
Spoke w/ [name]- informed letter and script is ready for pick up. She states they already bought a walker in July but is now trying to be reimbursed by ins and is just needing letter. We agreed to mail the letter to Pt and her home.

## 2023-05-25 NOTE — Telephone Encounter (Signed)
[name] called and wanted to ask if PCP could send a letter stating that he prescribed a walker to the patient so she can submit to their insurance. Please advise.

## 2023-05-25 NOTE — Telephone Encounter (Signed)
Will do: - Prescription for a walker. - Also a letter stating that in my professional opinion the patient would benefit tremendously from having a walker to increase his mobility

## 2023-05-25 NOTE — Telephone Encounter (Signed)
Please advise? I don't see that you have ever written a prescription for him to get a walker- we could probably just give them a dme walker prescription to take to a medical supply store.

## 2023-05-25 NOTE — Addendum Note (Signed)
Addended byConrad [place] D on: 05/25/2023 12:47 PM   Modules accepted: Orders

## 2023-06-12 ENCOUNTER — Other Ambulatory Visit: Payer: Self-pay | Admitting: Internal Medicine

## 2023-06-15 ENCOUNTER — Telehealth: Payer: Self-pay

## 2023-06-15 NOTE — Telephone Encounter (Signed)
Hospice orders signed and faxed back to [place] at [phone]. Form sent for scanning.

## 2023-06-21 ENCOUNTER — Other Ambulatory Visit: Payer: Self-pay | Admitting: Internal Medicine

## 2023-07-13 ENCOUNTER — Telehealth: Payer: Self-pay | Admitting: Internal Medicine

## 2023-07-13 NOTE — Telephone Encounter (Signed)
Spoke w/ [facility], their hospice doctor has already signed the death certificate. He passed away on 07-25-23 at 1319.

## 2023-07-13 NOTE — Telephone Encounter (Signed)
  The patient passed away on his sleep, was found by his daughter on 06/24/2023. Spoke with the daughter, condolences provided.  Please call hospice, what is the official time of death? Do I need to sign the certificate?

## 2023-07-15 ENCOUNTER — Telehealth: Payer: Self-pay | Admitting: Internal Medicine

## 2023-07-15 NOTE — Telephone Encounter (Signed)
Spoke w/ [name]- informed that we were informed that hospice provider at [facility] had signed death certificate. [name] states she will reach out to them.

## 2023-07-15 NOTE — Telephone Encounter (Signed)
Copied from CRM [id number]. Topic: General - Other >> Jul 15, 2023  9:37 AM Steele Sizer wrote: Reason for CRM: Dennie Bible called from [facility] and would like to report the pt has passed on 2023-08-01 and would like to confirm that the PCP can sign the death certificate, she said the paperwork is in the system waiting to be completed.  Callback [phone]

## 2023-07-15 NOTE — Telephone Encounter (Addendum)
Was informed by [facility] on 07/13/23 that hospice doctor signed death certificate. Tried calling [employer]- no answer. Will try again later.

## 2023-07-18 DEATH — deceased

## 2023-08-28 ENCOUNTER — Ambulatory Visit: Payer: Medicare Other | Admitting: Internal Medicine

## 2023-09-01 ENCOUNTER — Ambulatory Visit: Payer: Medicare Other | Admitting: Neurology
